# Patient Record
Sex: Male | Born: 1980 | Race: Black or African American | Hispanic: No | Marital: Single | Smoking: Never smoker
Health system: Southern US, Community
[De-identification: ages and names within clinical notes are randomized; demographics above are authoritative.]

## PROBLEM LIST (undated history)

## (undated) DIAGNOSIS — E039 Hypothyroidism, unspecified: Secondary | ICD-10-CM

## (undated) DIAGNOSIS — D649 Anemia, unspecified: Secondary | ICD-10-CM

## (undated) DIAGNOSIS — R7989 Other specified abnormal findings of blood chemistry: Secondary | ICD-10-CM

## (undated) DIAGNOSIS — K047 Periapical abscess without sinus: Secondary | ICD-10-CM

## (undated) DIAGNOSIS — R040 Epistaxis: Secondary | ICD-10-CM

## (undated) HISTORY — PX: OTHER SURGICAL HISTORY: SHX169

---

## 2006-11-21 ENCOUNTER — Inpatient Hospital Stay (HOSPITAL_COMMUNITY): Admission: EM | Admit: 2006-11-21 | Discharge: 2006-11-22 | Payer: Self-pay | Admitting: Emergency Medicine

## 2008-11-04 ENCOUNTER — Emergency Department (HOSPITAL_BASED_OUTPATIENT_CLINIC_OR_DEPARTMENT_OTHER): Admission: EM | Admit: 2008-11-04 | Discharge: 2008-11-04 | Payer: Self-pay | Admitting: Emergency Medicine

## 2008-11-04 ENCOUNTER — Ambulatory Visit: Payer: Self-pay | Admitting: Diagnostic Radiology

## 2008-12-23 ENCOUNTER — Ambulatory Visit: Payer: Self-pay | Admitting: Diagnostic Radiology

## 2008-12-23 ENCOUNTER — Emergency Department (HOSPITAL_BASED_OUTPATIENT_CLINIC_OR_DEPARTMENT_OTHER): Admission: EM | Admit: 2008-12-23 | Discharge: 2008-12-23 | Payer: Self-pay | Admitting: Emergency Medicine

## 2010-05-01 ENCOUNTER — Ambulatory Visit: Payer: Self-pay | Admitting: Cardiovascular Disease

## 2010-05-01 ENCOUNTER — Observation Stay (HOSPITAL_COMMUNITY): Admission: EM | Admit: 2010-05-01 | Discharge: 2010-05-01 | Payer: Self-pay | Admitting: Emergency Medicine

## 2010-05-01 ENCOUNTER — Encounter (INDEPENDENT_AMBULATORY_CARE_PROVIDER_SITE_OTHER): Payer: Self-pay | Admitting: Emergency Medicine

## 2010-10-03 LAB — POCT I-STAT, CHEM 8
BUN: 10 mg/dL (ref 6–23)
Calcium, Ion: 1.08 mmol/L — ABNORMAL LOW (ref 1.12–1.32)
Chloride: 103 mEq/L (ref 96–112)
Creatinine, Ser: 1.3 mg/dL (ref 0.4–1.5)
Glucose, Bld: 122 mg/dL — ABNORMAL HIGH (ref 70–99)
HCT: 48 % (ref 39.0–52.0)
Hemoglobin: 16.3 g/dL (ref 13.0–17.0)
Potassium: 2.9 mEq/L — ABNORMAL LOW (ref 3.5–5.1)
Sodium: 139 mEq/L (ref 135–145)
TCO2: 24 mmol/L (ref 0–100)

## 2010-10-03 LAB — HEPATIC FUNCTION PANEL
ALT: 57 U/L — ABNORMAL HIGH (ref 0–53)
AST: 59 U/L — ABNORMAL HIGH (ref 0–37)
Albumin: 4.2 g/dL (ref 3.5–5.2)
Alkaline Phosphatase: 69 U/L (ref 39–117)
Bilirubin, Direct: 0.2 mg/dL (ref 0.0–0.3)
Indirect Bilirubin: 0.9 mg/dL (ref 0.3–0.9)
Total Bilirubin: 1.1 mg/dL (ref 0.3–1.2)
Total Protein: 7.1 g/dL (ref 6.0–8.3)

## 2010-10-03 LAB — CBC
HCT: 44.9 % (ref 39.0–52.0)
Hemoglobin: 15.6 g/dL (ref 13.0–17.0)
MCH: 33 pg (ref 26.0–34.0)
MCHC: 34.7 g/dL (ref 30.0–36.0)
MCV: 94.9 fL (ref 78.0–100.0)
Platelets: 161 10*3/uL (ref 150–400)
RBC: 4.73 MIL/uL (ref 4.22–5.81)
RDW: 12.2 % (ref 11.5–15.5)
WBC: 8 10*3/uL (ref 4.0–10.5)

## 2010-10-03 LAB — RAPID URINE DRUG SCREEN, HOSP PERFORMED
Amphetamines: NOT DETECTED
Barbiturates: NOT DETECTED
Benzodiazepines: NOT DETECTED
Cocaine: NOT DETECTED
Opiates: NOT DETECTED
Tetrahydrocannabinol: NOT DETECTED

## 2010-10-03 LAB — LIPASE, BLOOD: Lipase: 25 U/L (ref 11–59)

## 2010-10-03 LAB — POCT CARDIAC MARKERS
CKMB, poc: <1 ng/mL — ABNORMAL LOW (ref 1.0–8.0)
CKMB, poc: <1 ng/mL — ABNORMAL LOW (ref 1.0–8.0)
Myoglobin, poc: 38.2 ng/mL (ref 12–200)
Myoglobin, poc: 41.7 ng/mL (ref 12–200)
Troponin i, poc: <0.05 ng/mL (ref 0.00–0.09)
Troponin i, poc: <0.05 ng/mL (ref 0.00–0.09)

## 2010-10-03 LAB — D-DIMER, QUANTITATIVE: D-Dimer, Quant: 0.33 ug/mL-FEU (ref 0.00–0.48)

## 2010-10-03 LAB — ETHANOL: Alcohol, Ethyl (B): <5 mg/dL (ref 0–10)

## 2010-10-28 LAB — BASIC METABOLIC PANEL
BUN: 13 mg/dL (ref 6–23)
CO2: 26 mEq/L (ref 19–32)
Calcium: 9.1 mg/dL (ref 8.4–10.5)
Chloride: 103 mEq/L (ref 96–112)
Creatinine, Ser: 1 mg/dL (ref 0.4–1.5)
GFR calc Af Amer: >60 mL/min (ref >60)
GFR calc non Af Amer: >60 mL/min (ref >60)
Glucose, Bld: 88 mg/dL (ref 70–99)
Potassium: 3.6 mEq/L (ref 3.5–5.1)
Sodium: 141 mEq/L (ref 135–145)

## 2010-10-28 LAB — DIFFERENTIAL
Basophils Absolute: 0 10*3/uL (ref 0.0–0.1)
Basophils Relative: 1 % (ref 0–1)
Eosinophils Absolute: 0.1 10*3/uL (ref 0.0–0.7)
Eosinophils Relative: 3 % (ref 0–5)
Lymphocytes Relative: 40 % (ref 12–46)
Lymphs Abs: 1.8 10*3/uL (ref 0.7–4.0)
Monocytes Absolute: 0.5 10*3/uL (ref 0.1–1.0)
Monocytes Relative: 11 % (ref 3–12)
Neutro Abs: 2.1 10*3/uL (ref 1.7–7.7)
Neutrophils Relative %: 46 % (ref 43–77)

## 2010-10-28 LAB — CBC
HCT: 42.9 % (ref 39.0–52.0)
Hemoglobin: 14.8 g/dL (ref 13.0–17.0)
MCHC: 34.4 g/dL (ref 30.0–36.0)
MCV: 96.2 fL (ref 78.0–100.0)
Platelets: 230 10*3/uL (ref 150–400)
RBC: 4.46 MIL/uL (ref 4.22–5.81)
RDW: 11.8 % (ref 11.5–15.5)
WBC: 4.5 10*3/uL (ref 4.0–10.5)

## 2010-10-30 LAB — POCT CARDIAC MARKERS
CKMB, poc: 1.2 ng/mL (ref 1.0–8.0)
Myoglobin, poc: 48.4 ng/mL (ref 12–200)
Troponin i, poc: <0.05 ng/mL (ref 0.00–0.09)

## 2010-12-06 NOTE — Op Note (Signed)
[name], [name]               ACCOUNT NO.:  [id number]   MEDICAL RECORD NO.:  [id number]          PATIENT TYPE:  INP   LOCATION:  1534                         FACILITY:  [facility]   PHYSICIAN:  Dionne Ano. Gramig III, M.D.DATE OF BIRTH:  Jul 30, 1980   DATE OF PROCEDURE:  11/21/2006  DATE OF DISCHARGE:                               OPERATIVE REPORT   PREOPERATIVE DIAGNOSIS:  Right forearm deep laceration/puncture injury  with inability to extend the fingers and wrist (the posterior  interosseous nerve injury with musculotendinous injury noted).   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   OPERATION/PROCEDURE:  1. Irrigation and debridement of skin, subcutaneous tissue, muscle      tendon, and periosteal bone tissue, right forearm.  2. Packed fasciotomy, right forearm.  3. Radial nerve decompression, extensive with neurolysis.  4. Posterior interosseous nerve repair at the forearm level (major      peripheral nerve repair at the forearm level).  5. Extensor carpi ulnaris at tendon repair.   SURGEON:  Dionne Ano. Amanda Pea, M.D.   COMPLICATIONS:  None.   ANESTHESIA:  General.   TOURNIQUET TIME:  Less than 90 minutes.   INDICATIONS FOR PROCEDURE:  This patient is a 30 year old male who  presented to the emergency room with puncture wound.  He states he  recalls following down some steps, but cannot give all of the details of  the event.  In any event, he presents after a questionable fall with a  large puncture wound in the forearm.  He has been given preoperative  antibiotics.  His tetanus status has been addressed.  He has been  counseled in regards to his method of surgery graft of surgery including  risk of infection, bleeding, anesthesia, damage to normal structures and  failure of surgery to accomplish its intended goals of relieving  symptoms and restoring function.  I have  discussed with him the grave  nature of a nerve laceration at this level.   OPERATION:  The patient was taken to the operating suite and underwent  smooth induction of general anesthesia.  The patient was placed supine  position, properly padded, and underwent sterile prep about the right  upper extremity.  Bony architecture was intact.  X-rays were negative  for fracture buy he did have periosteal involvement.  I commenced with  an incision proximal and distal to the laceration which was an inch to  an inch and a half long. I could stick my fully gloved finger all the  way to the my  MCP joint in terms of the depths of the penetration of  the instrument which penetrated his skin.  This instrument course was  unknown at the present time according to the patient's verbal report.   Following this, I enlarged the wound and performed a fasciotomy of the  forearm.  Fasciotomy was performed about the extensor apparatus without  difficulty.   Following this, I then performed irrigation and debridement of skin,  subcutaneous tissue and bone to include periosteal tissue, muscle and  tendon tissue.  This was an excisional debridement with evacuation of  hematoma and removal of nonviable tissue.  Once this was done, I lavaged  him with greater than  3 L of saline.  Once this done, I then evaluated  the patient's soft tissue parameters.  She had a complete laceration of  the posterior interosseous nerve accounting for his loss of wrist  extension and finger extension.  I had to mobilize the area and thus  performed a complete radial tunnel release, releasing the supinator in  its entirety.  I released the supinator, freed up the radial nerve and  its posterior interosseous nerve contributions.  This was a radial  tunnel release.  The arcade of [place]ew and the supinator were released as  was the ECRB.  Following radial tunnel release, I then dressed the  posterior interosseous nerve.   Post  interosseous nerve was repaired with combination 8-0 and 7-0 nylon  with microscopic technique and magnification.  The nerve was repaired  circumferentially without difficulty and following circumferential  repair, I then tested it and it was noted be stable.  I would give this  a variable prognosis given the severity of the injury.   Following this, I irrigated additionally.  I checked the area once again  and then performed extensor carpi ulnaris tendon repair as at this  level, the patient had good tissue for repair.  The remaining tissue was  primary musculature and did not involve large amounts of tendon tissue  and thus I did not perform any EDC, EPL or EIP tendon repairs.  This was  cut through the muscle belly.  Once this was done, I then performed  irrigation of the area.  Irrigation was applied without difficulty and  the wound was then closed after hemostasis was secured with Vicryl  followed by Prolene.  Long-arm plaster splint was applied with the wrist  in extension and the elbow at 90.   He will be continued as an inpatient.  We will plan for IV antibiotics,  pain management and other postoperative measures.   I would consider early radial nerve transfers with the FCR and palmaris  longus if he has one for donor.  This is due to the high lesion in terms  of the forearm and the variable prognosis for recovery in my opinion.  All questions have been encouraged and answered.           ______________________________  Dionne Ano. Everlene Other, M.D.     Nash Mantis  D:  11/21/2006  T:  11/22/2006  Job:  [id number]

## 2011-03-07 ENCOUNTER — Emergency Department (HOSPITAL_COMMUNITY)
Admission: EM | Admit: 2011-03-07 | Discharge: 2011-03-07 | Disposition: A | Discharge status: Home / Self Care | Payer: Self-pay | Attending: Emergency Medicine | Admitting: Emergency Medicine

## 2011-03-07 ENCOUNTER — Emergency Department (HOSPITAL_COMMUNITY): Payer: Self-pay

## 2011-03-07 DIAGNOSIS — R079 Chest pain, unspecified: Secondary | ICD-10-CM | POA: Insufficient documentation

## 2011-03-07 DIAGNOSIS — K029 Dental caries, unspecified: Secondary | ICD-10-CM | POA: Insufficient documentation

## 2011-03-07 DIAGNOSIS — K089 Disorder of teeth and supporting structures, unspecified: Secondary | ICD-10-CM | POA: Insufficient documentation

## 2011-03-07 DIAGNOSIS — R209 Unspecified disturbances of skin sensation: Secondary | ICD-10-CM | POA: Insufficient documentation

## 2011-04-29 ENCOUNTER — Emergency Department (HOSPITAL_COMMUNITY): Payer: Self-pay

## 2011-04-29 ENCOUNTER — Emergency Department (HOSPITAL_COMMUNITY)
Admission: EM | Admit: 2011-04-29 | Discharge: 2011-04-29 | Disposition: A | Discharge status: Home / Self Care | Payer: Self-pay | Attending: Emergency Medicine | Admitting: Emergency Medicine

## 2011-04-29 DIAGNOSIS — R079 Chest pain, unspecified: Secondary | ICD-10-CM | POA: Insufficient documentation

## 2011-04-29 DIAGNOSIS — R0602 Shortness of breath: Secondary | ICD-10-CM | POA: Insufficient documentation

## 2011-04-29 DIAGNOSIS — R209 Unspecified disturbances of skin sensation: Secondary | ICD-10-CM | POA: Insufficient documentation

## 2011-04-29 LAB — BASIC METABOLIC PANEL
BUN: 11 mg/dL (ref 6–23)
CO2: 26 mEq/L (ref 19–32)
Calcium: 10 mg/dL (ref 8.4–10.5)
GFR calc non Af Amer: >90 mL/min (ref >90)
Glucose, Bld: 103 mg/dL — ABNORMAL HIGH (ref 70–99)
Potassium: 3.7 mEq/L (ref 3.5–5.1)
Sodium: 137 mEq/L (ref 135–145)

## 2011-04-29 LAB — DIFFERENTIAL
Basophils Absolute: 0 10*3/uL (ref 0.0–0.1)
Basophils Relative: 0 % (ref 0–1)
Eosinophils Absolute: 0.1 10*3/uL (ref 0.0–0.7)
Lymphocytes Relative: 26 % (ref 12–46)
Monocytes Absolute: 0.5 10*3/uL (ref 0.1–1.0)
Monocytes Relative: 8 % (ref 3–12)
Neutro Abs: 3.7 10*3/uL (ref 1.7–7.7)
Neutrophils Relative %: 65 % (ref 43–77)

## 2011-04-29 LAB — CBC
HCT: 45.4 % (ref 39.0–52.0)
Hemoglobin: 15.7 g/dL (ref 13.0–17.0)
MCH: 33.1 pg (ref 26.0–34.0)
MCHC: 34.6 g/dL (ref 30.0–36.0)
MCV: 95.8 fL (ref 78.0–100.0)
Platelets: 232 10*3/uL (ref 150–400)
RBC: 4.74 MIL/uL (ref 4.22–5.81)

## 2011-06-15 ENCOUNTER — Emergency Department (HOSPITAL_COMMUNITY)
Admission: EM | Admit: 2011-06-15 | Discharge: 2011-06-15 | Disposition: A | Discharge status: Home / Self Care | Payer: Self-pay | Attending: Emergency Medicine | Admitting: Emergency Medicine

## 2011-06-15 ENCOUNTER — Encounter: Payer: Self-pay | Admitting: *Deleted

## 2011-06-15 ENCOUNTER — Other Ambulatory Visit: Payer: Self-pay

## 2011-06-15 DIAGNOSIS — R209 Unspecified disturbances of skin sensation: Secondary | ICD-10-CM | POA: Insufficient documentation

## 2011-06-15 DIAGNOSIS — R202 Paresthesia of skin: Secondary | ICD-10-CM

## 2011-06-15 HISTORY — DX: Periapical abscess without sinus: K04.7

## 2011-06-15 MED ORDER — ASPIRIN 81 MG PO CHEW: 81.0000 mg | CHEWABLE_TABLET | Freq: Every day | ORAL | Status: AC

## 2011-06-15 NOTE — ED Notes (Signed)
Pt states he is feeling numbness in his  feet and chest, seen here for same twice over last few months, was told and treated for teeth infection which he has not taken care of.

## 2011-06-15 NOTE — ED Provider Notes (Addendum)
History     CSN: [id number] Arrival date & time: 06/15/2011  3:22 PM   First MD Initiated Contact with Patient 06/15/11 1820      Chief Complaint  Patient presents with  . Numbness     The history is provided by the patient.   the patient reports intermittent tingling of his left chest left arm and left leg.  This been occurring for approximately 2 months.  He reports his symptoms last approximately 4-12 hours.  There is no associated facial symptoms.  There is no associated weakness.  No changes in speech.  No difficulty swallowing.  No prior history of stroke.  No history of heart disease.  No early family history of heart disease.  He reports having a stress test one year ago for some twinges of pain that was negative.  He does not have a primary care doctor he does not have insurance.  He is seen in the emergency department with a several times without a diagnosis.  Nothing worsens the symptoms.  Nothing improves the symptoms.  The symptoms are mild.  At this time he is without numbness or tingling or weakness.  Past Medical History  Diagnosis Date  . Tooth infection     History reviewed. No pertinent past surgical history.  No family history on file.  History  Substance Use Topics  . Smoking status: Never Smoker   . Smokeless tobacco: Not on file  . Alcohol Use: Yes      Review of Systems  All other systems reviewed and are negative.    Allergies  Review of patient's allergies indicates no known allergies.  Home Medications  No current outpatient prescriptions on file.  BP 129/82  Pulse 89  Temp(Src) 98.9 F (37.2 C) (Oral)  Resp 18  SpO2 100%  Physical Exam  Nursing note and vitals reviewed. Constitutional: He is oriented to person, place, and time. He appears well-developed and well-nourished.  HENT:  Head: Normocephalic and atraumatic.  Eyes: Pupils are equal, round, and reactive to light.  Cardiovascular: Regular rhythm.   Pulmonary/Chest: Effort  normal.  Abdominal: Soft.  Neurological: He is alert and oriented to person, place, and time.       5/5 strength in major muscle groups of  bilateral upper and lower extremities. Speech normal. No facial asymetry.  Normal sensation throughout his face upper extremity and lower extremity.    ED Course  Procedures (including critical care time)   Date: 06/15/2011  Rate: 70  Rhythm: normal sinus rhythm  QRS Axis: normal  Intervals: normal  ST/T Wave abnormalities: normal  Conduction Disutrbances:none  Narrative Interpretation:   Old EKG Reviewed: No significant changes noted   Labs Reviewed - No data to display No results found.   1. Paresthesia       MDM  Paresthesias.  Doubt stroke.  Doubt TIA.  No cord syndrome.  EKG is normal sinus rhythm.  Doubt ACS.  Patient referred to neurology for outpatient workup for paresthesias.        Lyanne Co, MD 06/15/11 Windell Moment  Lyanne Co, MD 06/15/11 [phone]

## 2011-06-15 NOTE — ED Notes (Signed)
For 6-7 months the pt states that he has had numbness in his chest, left arm, and feet off and on. He states that it comes on sometimes after he eats. He has also had it investigated in the past to see if it had anything to do with a tooth that needs to be pulled. No pain. Just tingling and numbness. NAD.

## 2011-09-02 ENCOUNTER — Encounter (HOSPITAL_BASED_OUTPATIENT_CLINIC_OR_DEPARTMENT_OTHER): Payer: Self-pay | Admitting: Emergency Medicine

## 2011-09-02 ENCOUNTER — Emergency Department (HOSPITAL_BASED_OUTPATIENT_CLINIC_OR_DEPARTMENT_OTHER)
Admission: EM | Admit: 2011-09-02 | Discharge: 2011-09-02 | Disposition: A | Discharge status: Home / Self Care | Payer: Self-pay | Attending: Emergency Medicine | Admitting: Emergency Medicine

## 2011-09-02 DIAGNOSIS — R202 Paresthesia of skin: Secondary | ICD-10-CM

## 2011-09-02 DIAGNOSIS — R209 Unspecified disturbances of skin sensation: Secondary | ICD-10-CM | POA: Insufficient documentation

## 2011-09-02 NOTE — ED Notes (Addendum)
Pt reports awaking with numbness in left hand and left arm. Symptoms have since resolved. Pt was seen for same in november

## 2011-09-02 NOTE — ED Provider Notes (Signed)
History     CSN: [id number]  Arrival date & time 09/02/11  0456   First MD Initiated Contact with Patient 09/02/11 [phone]      Chief Complaint  Patient presents with  . Numbness    (Consider location/radiation/quality/duration/timing/severity/associated sxs/prior treatment) HPI Is a 31 year old black male with no significant past medical history. He will component hour ago with paresthesias in his left hand. It was not completely numb felt like pins and needles. He describes the symptoms as moderate to severe. They resolved over about the next 25 minutes. He has only the slightest remaining paresthesias in his fingertips. He states the paresthesias were in his entire hand and part of the left forearm and has not describe a particular dermatomal or nerve pattern. He states he was sleeping on the floor. There was no associated pain. There was no associated motor deficit. There was no associated headache. There was no associated neck pain. There was associated chest pain, dyspnea, nausea, vomiting or diarrhea. Nothing made the symptoms better or worse and they resolve on their own. He was evaluated here November of last year for similar complaint. He stated at that time his symptoms have been coming and going for months. He was referred to neurology but did not follow up.   Past Medical History  Diagnosis Date  . Tooth infection     History reviewed. No pertinent past surgical history.  No family history on file.  History  Substance Use Topics  . Smoking status: Never Smoker   . Smokeless tobacco: Not on file  . Alcohol Use: Yes      Review of Systems  All other systems reviewed and are negative.    Allergies  Review of patient's allergies indicates no known allergies.  Home Medications   Current Outpatient Rx  Name Route Sig Dispense Refill  . ASPIRIN 81 MG PO CHEW Oral Chew 1 tablet (81 mg total) by mouth daily. 30 tablet 0    BP 151/81  Pulse 73  Temp(Src) 98.1 F  (36.7 C) (Oral)  Resp 18  Ht 6\' 3"  (1.905 m)  Wt 240 lb (108.863 kg)  BMI 30.00 kg/m2  SpO2 100%  Physical Exam General: Well-developed, well-nourished male in no acute distress; appearance consistent with age of record HENT: normocephalic, atraumatic Eyes: pupils equal round and reactive to light; extraocular muscles intact Neck: supple; non-tender Heart: regular rate and rhythm; no murmurs Lungs: clear to auscultation bilaterally Abdomen: soft; nondistended Extremities: No deformity; full range of motion; pulses normal Neurologic: Awake, alert and oriented; motor +5/5 in all extremities and symmetric; no facial droop; normal sensation; normal coordination; normal speech; negative Romberg; normal finger to nose; normal gait Skin: Warm and dry Psychiatric: Normal mood and affect    ED Course  Procedures (including critical care time)     MDM  The patient has no risk factors for stroke. As noted above he has had similar episodes in the past. The symptoms do not appear to be due to a radiculopathy. The transient nature of the symptoms makes a neurodegenerative disease unlikely. The onset during sleep, and resolution after awakening, suggest positional nerve compression.          Hanley Seamen, MD 09/02/11 [phone]

## 2012-06-19 ENCOUNTER — Encounter (HOSPITAL_BASED_OUTPATIENT_CLINIC_OR_DEPARTMENT_OTHER): Payer: Self-pay | Admitting: *Deleted

## 2012-06-19 ENCOUNTER — Emergency Department (HOSPITAL_BASED_OUTPATIENT_CLINIC_OR_DEPARTMENT_OTHER)
Admission: EM | Admit: 2012-06-19 | Discharge: 2012-06-19 | Disposition: A | Discharge status: Home / Self Care | Payer: Self-pay | Attending: Emergency Medicine | Admitting: Emergency Medicine

## 2012-06-19 DIAGNOSIS — R0602 Shortness of breath: Secondary | ICD-10-CM | POA: Insufficient documentation

## 2012-06-19 DIAGNOSIS — R079 Chest pain, unspecified: Secondary | ICD-10-CM

## 2012-06-19 DIAGNOSIS — R209 Unspecified disturbances of skin sensation: Secondary | ICD-10-CM | POA: Insufficient documentation

## 2012-06-19 NOTE — ED Provider Notes (Signed)
History  This chart was scribed for [name]. Oletta Lamas, MD by Ardeen Jourdain, ED Scribe. This patient was seen in room MH02/MH02 and the patient's care was started at 2044.  CSN: [id number]  Arrival date & time 06/19/12  2002   First MD Initiated Contact with Patient 06/19/12 2044      Chief Complaint  Patient presents with  . Chest Pain     The history is provided by the patient. No language interpreter was used.    [name] is a 31 y.o. male who presents to the Emergency Department complaining of gradually worsening CP. He states he has had the pain intermittently for the past 6-7 months, but tonight at 4:30 it started to worsen. He reports watching TV when the symptoms began. He has associated numbness in his left arm and leg as well as intermittent SOB. He has a h/o these symptoms in the past, which he states has been giving him anxiety. He denies any injury that could have caused the pain. He denies a family history of heart disease.  He has a h/o tooth infection as well. He is an occasional alcohol user but denies smoking.  At work he does a lot of heavy lifting and moving things.  With exertion at work or with exercise, no CP, SOB.  Denies any associated sweats, nausea, dizziness.  No family h/o CAD, fainting, cardiomyopathy.  Doesn't smoke.  No recent long distance travel . No lower leg pain or swelling.      Past Medical History  Diagnosis Date  . Tooth infection     History reviewed. No pertinent past surgical history.  Family History  Problem Relation Age of Onset  . Heart failure Neg Hx   . Heart attack Neg Hx   . Fainting Neg Hx     History  Substance Use Topics  . Smoking status: Never Smoker   . Smokeless tobacco: Not on file  . Alcohol Use: Yes      Review of Systems  Constitutional: Negative for fever and chills.  HENT: Negative for congestion, sore throat and rhinorrhea.   Respiratory: Positive for shortness of breath.   Cardiovascular: Positive  for chest pain. Negative for palpitations and leg swelling.  Gastrointestinal: Negative for nausea and vomiting.  Musculoskeletal: Negative for back pain.  Neurological: Positive for numbness. Negative for dizziness, syncope and weakness.    Allergies  Review of patient's allergies indicates no known allergies.  Home Medications  No current outpatient prescriptions on file.  Triage Vitals: BP 142/88  Pulse 90  Temp 98.5 F (36.9 C) (Oral)  Resp 20  Ht 6\' 3"  (1.905 m)  Wt 240 lb (108.863 kg)  BMI 30.00 kg/m2  SpO2 100%  Physical Exam  Nursing note and vitals reviewed. Constitutional: He is oriented to person, place, and time. He appears well-developed and well-nourished. No distress.  HENT:  Head: Normocephalic and atraumatic.  Eyes: EOM are normal. Pupils are equal, round, and reactive to light.  Neck: Normal range of motion. Neck supple. No tracheal deviation present.  Cardiovascular: Normal rate, regular rhythm and normal heart sounds.  Exam reveals no gallop and no friction rub.   No murmur heard. Pulmonary/Chest: Effort normal and breath sounds normal. No respiratory distress.  Abdominal: Soft. He exhibits no distension. There is no tenderness.  Musculoskeletal: Normal range of motion. He exhibits no edema.  Neurological: He is alert and oriented to person, place, and time. He has normal strength. No sensory deficit. He exhibits  normal muscle tone. Coordination normal. GCS eye subscore is 4. GCS verbal subscore is 5. GCS motor subscore is 6.  Skin: Skin is warm and dry. He is not diaphoretic.  Psychiatric: He has a normal mood and affect. His behavior is normal.    ED Course  Procedures (including critical care time)  DIAGNOSTIC STUDIES: Oxygen Saturation is 100% on room air, normal by my interpretation.    COORDINATION OF CARE:  9:26 PM: Discussed treatment plan which includes an EKG with pt at bedside and pt agreed to plan.    Labs Reviewed - No data to  display No results found.   1. Chest pain     ECG at time 20:14 shows NSR at rate 80, normal axis, non specific T wave abn, normal intervals.  No sig change from ECG on 06/15/2011.    MDM  I personally performed the services described in this documentation, which was scribed in my presence. The recorded information has been reviewed and is accurate.  No exertional CP.  No stroke symptoms.  Normal vitals, sats normal, no fever, exam is normal . Pt is reassured.  No cad risks.  No pe risks.       [name]. Oletta Lamas, MD 06/19/12 2306

## 2012-06-19 NOTE — ED Notes (Signed)
Pt states this has happened off and on x 6-7 months. Tonight was sitting and had had a beer. Began having a "funny feeling" in his chest. Numbness left arm and leg. Short of breath at night sometimes. Denies other s/s. EKG being done at triage.

## 2012-06-19 NOTE — Discharge Instructions (Signed)
 Chest Pain (Nonspecific) It is often hard to give a specific diagnosis for the cause of chest pain. There is always a chance that your pain could be related to something serious, such as a heart attack or a blood clot in the lungs. You need to follow up with your caregiver for further evaluation. CAUSES   Heartburn.  Pneumonia or bronchitis.  Anxiety or stress.  Inflammation around your heart (pericarditis) or lung (pleuritis or pleurisy).  A blood clot in the lung.  A collapsed lung (pneumothorax). It can develop suddenly on its own (spontaneous pneumothorax) or from injury (trauma) to the chest.  Shingles infection (herpes zoster virus). The chest wall is composed of bones, muscles, and cartilage. Any of these can be the source of the pain.  The bones can be bruised by injury.  The muscles or cartilage can be strained by coughing or overwork.  The cartilage can be affected by inflammation and become sore (costochondritis). DIAGNOSIS  Lab tests or other studies, such as X-rays, electrocardiography, stress testing, or cardiac imaging, may be needed to find the cause of your pain.  TREATMENT   Treatment depends on what may be causing your chest pain. Treatment may include:  Acid blockers for heartburn.  Anti-inflammatory medicine.  Pain medicine for inflammatory conditions.  Antibiotics if an infection is present.  You may be advised to change lifestyle habits. This includes stopping smoking and avoiding alcohol, caffeine, and chocolate.  You may be advised to keep your head raised (elevated) when sleeping. This reduces the chance of acid going backward from your stomach into your esophagus.  Most of the time, nonspecific chest pain will improve within 2 to 3 days with rest and mild pain medicine. HOME CARE INSTRUCTIONS   If antibiotics were prescribed, take your antibiotics as directed. Finish them even if you start to feel better.  For the next few days, avoid physical  activities that bring on chest pain. Continue physical activities as directed.  Do not smoke.  Avoid drinking alcohol.  Only take over-the-counter or prescription medicine for pain, discomfort, or fever as directed by your caregiver.  Follow your caregiver's suggestions for further testing if your chest pain does not go away.  Keep any follow-up appointments you made. If you do not go to an appointment, you could develop lasting (chronic) problems with pain. If there is any problem keeping an appointment, you must call to reschedule. SEEK MEDICAL CARE IF:   You think you are having problems from the medicine you are taking. Read your medicine instructions carefully.  Your chest pain does not go away, even after treatment.  You develop a rash with blisters on your chest. SEEK IMMEDIATE MEDICAL CARE IF:   You have increased chest pain or pain that spreads to your arm, neck, jaw, back, or abdomen.  You develop shortness of breath, an increasing cough, or you are coughing up blood.  You have severe back or abdominal pain, feel nauseous, or vomit.  You develop severe weakness, fainting, or chills.  You have a fever. THIS IS AN EMERGENCY. Do not wait to see if the pain will go away. Get medical help at once. Call your local emergency services (911 in [place] Do not drive yourself to the hospital. MAKE SURE YOU:   Understand these instructions.  Will watch your condition.  Will get help right away if you are not doing well or get worse. Document Released: 04/16/2005 Document Revised: 09/29/2011 Document Reviewed: 02/10/2008 [facility] Patient Information 2013 [employer]   Consider taking daily antacids like prilosec or zantac which can be bought over the counter for reflux for 2 weeks and take tylenol  for mild pain relief as needed in the meantime.

## 2013-01-26 ENCOUNTER — Encounter (HOSPITAL_BASED_OUTPATIENT_CLINIC_OR_DEPARTMENT_OTHER): Payer: Self-pay

## 2013-01-26 ENCOUNTER — Emergency Department (HOSPITAL_BASED_OUTPATIENT_CLINIC_OR_DEPARTMENT_OTHER)
Admission: EM | Admit: 2013-01-26 | Discharge: 2013-01-26 | Disposition: A | Discharge status: Home / Self Care | Payer: Self-pay | Attending: Emergency Medicine | Admitting: Emergency Medicine

## 2013-01-26 DIAGNOSIS — Y9289 Other specified places as the place of occurrence of the external cause: Secondary | ICD-10-CM | POA: Insufficient documentation

## 2013-01-26 DIAGNOSIS — S01309A Unspecified open wound of unspecified ear, initial encounter: Secondary | ICD-10-CM | POA: Insufficient documentation

## 2013-01-26 DIAGNOSIS — Z8719 Personal history of other diseases of the digestive system: Secondary | ICD-10-CM | POA: Insufficient documentation

## 2013-01-26 DIAGNOSIS — IMO0002 Reserved for concepts with insufficient information to code with codable children: Secondary | ICD-10-CM | POA: Insufficient documentation

## 2013-01-26 DIAGNOSIS — Y9389 Activity, other specified: Secondary | ICD-10-CM | POA: Insufficient documentation

## 2013-01-26 DIAGNOSIS — S01312A Laceration without foreign body of left ear, initial encounter: Secondary | ICD-10-CM
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
## 2013-01-27 NOTE — ED Provider Notes (Signed)
Medical screening examination/treatment/procedure(s) were performed by non-physician practitioner and as supervising physician I was immediately available for consultation/collaboration.   Charles B. Sheldon, MD 01/27/13 1525 

## 2013-02-26 ENCOUNTER — Emergency Department (HOSPITAL_BASED_OUTPATIENT_CLINIC_OR_DEPARTMENT_OTHER)
Admission: EM | Admit: 2013-02-26 | Discharge: 2013-02-26 | Disposition: A | Discharge status: Home / Self Care | Payer: Self-pay | Attending: Emergency Medicine | Admitting: Emergency Medicine

## 2013-02-26 ENCOUNTER — Encounter (HOSPITAL_BASED_OUTPATIENT_CLINIC_OR_DEPARTMENT_OTHER): Payer: Self-pay | Admitting: *Deleted

## 2013-02-26 DIAGNOSIS — K029 Dental caries, unspecified: Secondary | ICD-10-CM | POA: Insufficient documentation

## 2013-02-26 MED ORDER — PENICILLIN V POTASSIUM 250 MG PO TABS
500.0000 mg | ORAL_TABLET | Freq: Once | ORAL | Status: AC
  Administered 2013-02-26: 500 mg via ORAL
  Filled 2013-02-26: qty 2

## 2013-02-26 MED ORDER — PENICILLIN V POTASSIUM 500 MG PO TABS: 500.0000 mg | ORAL_TABLET | Freq: Four times a day (QID) | ORAL | Status: AC

## 2013-02-26 MED ORDER — OXYCODONE-ACETAMINOPHEN 5-325 MG PO TABS: 1.0000 | ORAL_TABLET | Freq: Four times a day (QID) | ORAL | Status: DC | PRN

## 2013-02-26 NOTE — ED Provider Notes (Signed)
  CSN: [id number]     Arrival date & time 02/26/13  0020 History     None    Chief Complaint  Patient presents with  . Dental Pain   (Consider location/radiation/quality/duration/timing/severity/associated sxs/prior Treatment) Patient is a 32 y.o. male presenting with tooth pain. The history is provided by the patient. No language interpreter was used.  Dental Pain Location:  Lower Lower teeth location:  31/RL 2nd molar Quality:  Dull Severity:  Moderate Onset quality:  Sudden Duration:  2 days Timing:  Intermittent Progression:  Worsening Chronicity:  New Context: dental fracture   Previous work-up:  Dental exam Relieved by:  Nothing Worsened by:  Nothing tried Ineffective treatments:  None tried Associated symptoms: no congestion, no drooling, no neck swelling, no oral lesions and no trismus   Risk factors: no smoking     Past Medical History  Diagnosis Date  . Tooth infection    Past Surgical History  Procedure Laterality Date  . Arm surgery     Family History  Problem Relation Age of Onset  . Heart failure Neg Hx   . Heart attack Neg Hx   . Fainting Neg Hx    History  Substance Use Topics  . Smoking status: Never Smoker   . Smokeless tobacco: Not on file  . Alcohol Use: Yes     Comment: every other day    Review of Systems  HENT: Negative for congestion, drooling, mouth sores and neck stiffness.   All other systems reviewed and are negative.    Allergies  Review of patient's allergies indicates no known allergies.  Home Medications  No current outpatient prescriptions on file. BP 154/96  Pulse 94  Temp(Src) 99.6 F (37.6 C) (Oral)  Resp 18  Ht 6\' 3"  (1.905 m)  Wt 245 lb (111.131 kg)  BMI 30.62 kg/m2  SpO2 99% Physical Exam  Constitutional: He is oriented to person, place, and time. He appears well-developed and well-nourished. No distress.  HENT:  Head: Normocephalic and atraumatic.  Mouth/Throat:    Eyes: Conjunctivae are normal.  Pupils are equal, round, and reactive to light.  Neck: Normal range of motion. Neck supple.  Cardiovascular: Normal rate, regular rhythm and intact distal pulses.   Pulmonary/Chest: Effort normal and breath sounds normal. He has no wheezes. He has no rales.  Abdominal: Soft. Bowel sounds are normal. There is no tenderness. There is no rebound and no guarding.  Musculoskeletal: Normal range of motion.  Neurological: He is alert and oriented to person, place, and time.  Skin: Skin is warm and dry.  Psychiatric: He has a normal mood and affect.    ED Course   Procedures (including critical care time)  Labs Reviewed - No data to display No results found. No diagnosis found.  MDM  Will start antibiotics and pain medication and refer to dentist  Yamin Swingler K Aireona Torelli-Rasch, MD 02/26/13 0126

## 2013-02-26 NOTE — ED Notes (Signed)
Pt c/o pain in lower right molar since yesterday but became worse tonight.

## 2013-09-22 ENCOUNTER — Emergency Department (HOSPITAL_BASED_OUTPATIENT_CLINIC_OR_DEPARTMENT_OTHER)
Admission: EM | Admit: 2013-09-22 | Discharge: 2013-09-22 | Disposition: A | Discharge status: Home / Self Care | Payer: Self-pay | Attending: Emergency Medicine | Admitting: Emergency Medicine

## 2013-09-22 ENCOUNTER — Encounter (HOSPITAL_BASED_OUTPATIENT_CLINIC_OR_DEPARTMENT_OTHER): Payer: Self-pay | Admitting: Emergency Medicine

## 2013-09-22 DIAGNOSIS — Z791 Long term (current) use of non-steroidal anti-inflammatories (NSAID): Secondary | ICD-10-CM | POA: Insufficient documentation

## 2013-09-22 DIAGNOSIS — Z792 Long term (current) use of antibiotics: Secondary | ICD-10-CM | POA: Insufficient documentation

## 2013-09-22 DIAGNOSIS — K044 Acute apical periodontitis of pulpal origin: Secondary | ICD-10-CM | POA: Insufficient documentation

## 2013-09-22 DIAGNOSIS — K029 Dental caries, unspecified: Secondary | ICD-10-CM | POA: Insufficient documentation

## 2013-09-22 MED ORDER — PENICILLIN V POTASSIUM 250 MG PO TABS
500.0000 mg | ORAL_TABLET | Freq: Once | ORAL | Status: AC
  Administered 2013-09-22: 500 mg via ORAL
  Filled 2013-09-22: qty 2

## 2013-09-22 MED ORDER — PENICILLIN V POTASSIUM 500 MG PO TABS: 500.0000 mg | ORAL_TABLET | Freq: Four times a day (QID) | ORAL | Status: AC

## 2013-09-22 MED ORDER — DICLOFENAC SODIUM 75 MG PO TBEC: 75.0000 mg | DELAYED_RELEASE_TABLET | Freq: Two times a day (BID) | ORAL | Status: DC

## 2013-09-22 NOTE — Discharge Instructions (Signed)
Dental Caries °Dental caries is tooth decay. This decay can cause a hole in teeth (cavity) that can get bigger and deeper over time. °HOME CARE °· Brush and floss your teeth. Do this at least two times a day. °· Use a fluoride toothpaste. °· Use a mouth rinse if told by your dentist or doctor. °· Eat less sugary and starchy foods. Drink less sugary drinks. °· Avoid snacking often on sugary and starchy foods. Avoid sipping often on sugary drinks. °· Keep regular checkups and cleanings with your dentist. °· Use fluoride supplements if told by your dentist or doctor. °· Allow fluoride to be applied to teeth if told by your dentist or doctor. °MAKE SURE YOU: °· Understand these instructions. °· Will watch your condition. °· Will get help right away if you are not doing well or get worse. °Document Released: 04/15/2008 Document Revised: 03/09/2013 Document Reviewed: 07/09/2012 °Exit[employer]® Patient Information ©2014 [employer]. ° °

## 2013-09-22 NOTE — ED Provider Notes (Signed)
CSN: [id number]     Arrival date & time 09/22/13  0132 History   First MD Initiated Contact with Patient 09/22/13 0143     Chief Complaint  Patient presents with  . Dental Pain     (Consider location/radiation/quality/duration/timing/severity/associated sxs/prior Treatment) Patient is a 33 y.o. male presenting with tooth pain. The history is provided by the patient.  Dental Pain Location:  Upper Upper teeth location:  14/LU 1st molar and 15/LU 2nd molar Quality:  Dull Severity:  Moderate Onset quality:  Sudden Timing:  Constant Progression:  Unchanged Chronicity:  Recurrent Context: dental caries and poor dentition   Previous work-up:  Dental exam Relieved by:  Nothing Worsened by:  Nothing tried Ineffective treatments:  None tried Associated symptoms: no drooling   Associated symptoms comment:  Lower gum irritation Risk factors: alcohol problem     Past Medical History  Diagnosis Date  . Tooth infection    Past Surgical History  Procedure Laterality Date  . Arm surgery     Family History  Problem Relation Age of Onset  . Heart failure Neg Hx   . Heart attack Neg Hx   . Fainting Neg Hx    History  Substance Use Topics  . Smoking status: Never Smoker   . Smokeless tobacco: Not on file  . Alcohol Use: Yes     Comment: every other day    Review of Systems  HENT: Positive for dental problem. Negative for drooling.   All other systems reviewed and are negative.      Allergies  Review of patient's allergies indicates no known allergies.  Home Medications   Current Outpatient Rx  Name  Route  Sig  Dispense  Refill  . diclofenac (VOLTAREN) 75 MG EC tablet   Oral   Take 1 tablet (75 mg total) by mouth 2 (two) times daily.   14 tablet   0   . oxyCODONE-acetaminophen (PERCOCET) 5-325 MG per tablet   Oral   Take 1 tablet by mouth every 6 (six) hours as needed for pain.   13 tablet   0   . penicillin v potassium (VEETID) 500 MG tablet   Oral   Take 1  tablet (500 mg total) by mouth 4 (four) times daily.   40 tablet   0    BP 166/91  Pulse 74  Temp(Src) 99.8 F (37.7 C) (Oral)  Ht 6\' 3"  (1.905 m)  Wt 255 lb (115.667 kg)  BMI 31.87 kg/m2  SpO2 96% Physical Exam  Constitutional: He is oriented to person, place, and time. He appears well-developed and well-nourished. No distress.  HENT:  Head: Normocephalic and atraumatic.  Mouth/Throat: Oropharynx is clear and moist.    Eyes: EOM are normal.  Neck: Normal range of motion.  Cardiovascular: Normal rate, regular rhythm and intact distal pulses.   Pulmonary/Chest: Effort normal and breath sounds normal. He has no wheezes. He has no rales.  Abdominal: Soft. Bowel sounds are normal.  Musculoskeletal: Normal range of motion.  Lymphadenopathy:    He has no cervical adenopathy.  Neurological: He is alert and oriented to person, place, and time.  Skin: Skin is warm and dry.  Psychiatric: He has a normal mood and affect.    ED Course  Procedures (including critical care time) Labs Review Labs Reviewed - No data to display Imaging Review No results found.   EKG Interpretation None      MDM   Final diagnoses:  Dental caries   Penicillin and  follow up with dentist for ongoing care    Mckaylie Vasey K Calianne Larue-Rasch, MD 09/22/13 0202

## 2013-09-22 NOTE — ED Notes (Signed)
Pain in upper left tooth and lower left gum where a tooth was pulled a year ago.

## 2014-03-26 ENCOUNTER — Encounter (HOSPITAL_BASED_OUTPATIENT_CLINIC_OR_DEPARTMENT_OTHER): Payer: Self-pay | Admitting: Emergency Medicine

## 2014-03-26 ENCOUNTER — Emergency Department (HOSPITAL_BASED_OUTPATIENT_CLINIC_OR_DEPARTMENT_OTHER)
Admission: EM | Admit: 2014-03-26 | Discharge: 2014-03-26 | Disposition: A | Discharge status: Home / Self Care | Payer: Self-pay | Attending: Emergency Medicine | Admitting: Emergency Medicine

## 2014-03-26 DIAGNOSIS — Z792 Long term (current) use of antibiotics: Secondary | ICD-10-CM | POA: Insufficient documentation

## 2014-03-26 DIAGNOSIS — K029 Dental caries, unspecified: Secondary | ICD-10-CM | POA: Insufficient documentation

## 2014-03-26 DIAGNOSIS — K047 Periapical abscess without sinus: Secondary | ICD-10-CM | POA: Insufficient documentation

## 2014-03-26 DIAGNOSIS — K089 Disorder of teeth and supporting structures, unspecified: Secondary | ICD-10-CM | POA: Insufficient documentation

## 2014-03-26 MED ORDER — OXYCODONE-ACETAMINOPHEN 5-325 MG PO TABS
2.0000 | ORAL_TABLET | Freq: Once | ORAL | Status: AC
  Administered 2014-03-26: 2 via ORAL
  Filled 2014-03-26: qty 2

## 2014-03-26 MED ORDER — AMOXICILLIN 500 MG PO CAPS: 500.0000 mg | ORAL_CAPSULE | Freq: Three times a day (TID) | ORAL | Status: DC

## 2014-03-26 MED ORDER — OXYCODONE-ACETAMINOPHEN 5-325 MG PO TABS: ORAL_TABLET | ORAL | Status: DC

## 2014-03-26 MED ORDER — AMOXICILLIN 500 MG PO CAPS
500.0000 mg | ORAL_CAPSULE | Freq: Once | ORAL | Status: AC
  Administered 2014-03-26: 500 mg via ORAL
  Filled 2014-03-26: qty 1

## 2014-03-26 MED ORDER — BUPIVACAINE-EPINEPHRINE (PF) 0.5% -1:200000 IJ SOLN
1.8000 mL | Freq: Once | INTRAMUSCULAR | Status: AC
  Administered 2014-03-26: 1.8 mL
  Filled 2014-03-26: qty 1.8

## 2014-03-26 NOTE — Discharge Instructions (Signed)
Rinse the mouth with warm salt water once every hour this afternoon. Do not swallow blood or pus, spitting out.  Apply warm compresses to jaw throughout the day.   Take your antibiotics as directed and to completion. You should never have any leftover antibiotics! Push fluids and stay well hydrated.   Take percocet for breakthrough pain, do not drink alcohol, drive, care for children or do other critical tasks while taking percocet.  Return to the emergency room for fever, change in vision, redness to the face that rapidly spreads towards the eye, nausea or vomiting, difficulty swallowing or shortness of breath.   Followup with a dentist is very important for ongoing evaluation and management of recurrent dental pain. Return to emergency department for emergent changing or worsening symptoms.  Low-cost dental clinic: [facility]  at [phone]**  **[place] at [phone] [address]    You may also call [phone]  Dental Assistance If the dentist on-call cannot see you, please use the resources below:   Patients with Medicaid: [place] [phone] [address], [place] [address] [phone]  If unable to pay, or uninsured, contact [facility] [phone]) or [place] [phone] in [place], [place] in [facility]) to become qualified for the adult dental clinic  Other [place], [place], [place], [address]    [phone], Ext. [phone]    2nd and 4th Thursday of the month at 6:30am    10 clients each day by appointment, can sometimes see walk-in     patients if someone does not show for an appointment [place], [place], [place], [address]    [phone] [facility]- [address], [place], [place], [address]    [phone]  [place] Department- [phone] [facility] Department- [phone]

## 2014-03-26 NOTE — ED Notes (Signed)
Patient discharged to home with girlfriend . NAD.

## 2014-03-26 NOTE — ED Notes (Signed)
Pt here with dental pain in left upper jaw.  Pt has visible swelling to left cheek.  Pt has taken "leftover" penicillin x2 days.

## 2014-03-26 NOTE — ED Provider Notes (Signed)
CSN: [id number]     Arrival date & time 03/26/14  1123 History   First MD Initiated Contact with Patient 03/26/14 1201     Chief Complaint  Patient presents with  . Dental Pain     (Consider location/radiation/quality/duration/timing/severity/associated sxs/prior Treatment) HPI  [name] is a 33 y.o. male complaining of pain to left maxillary area onset 5 days ago with associated swelling noticed yesterday. Patient is taken ibuprofen at home with little relief. He denies fever, chills, difficulty swallowing, eye pain, change in vision, pain with eye movement. Pain is rated at 8/10  Past Medical History  Diagnosis Date  . Tooth infection    Past Surgical History  Procedure Laterality Date  . Arm surgery     Family History  Problem Relation Age of Onset  . Heart failure Neg Hx   . Heart attack Neg Hx   . Fainting Neg Hx    History  Substance Use Topics  . Smoking status: Never Smoker   . Smokeless tobacco: Not on file  . Alcohol Use: Yes     Comment: every other day    Review of Systems  10 systems reviewed and found to be negative, except as noted in the HPI.   Allergies  Review of patient's allergies indicates no known allergies.  Home Medications   Prior to Admission medications   Medication Sig Start Date End Date Taking? Authorizing Provider  amoxicillin (AMOXIL) 500 MG capsule Take 1 capsule (500 mg total) by mouth 3 (three) times daily. 03/26/14   Chrishaun Sasso, PA-C  diclofenac (VOLTAREN) 75 MG EC tablet Take 1 tablet (75 mg total) by mouth 2 (two) times daily. 09/22/13   April K Palumbo-Rasch, MD  oxyCODONE-acetaminophen (PERCOCET) 5-325 MG per tablet Take 1 tablet by mouth every 6 (six) hours as needed for pain. 02/26/13   April Alfonso Patten, MD  oxyCODONE-acetaminophen (PERCOCET/ROXICET) 5-325 MG per tablet 1 to 2 tabs PO q6hrs  PRN for pain 03/26/14   Elmyra Ricks Hampton Cost, PA-C   BP 130/55  Pulse 51  Temp(Src) 98.2 F (36.8 C) (Oral)  Resp 16  Ht 6'  3" (1.905 m)  Wt 255 lb (115.667 kg)  BMI 31.87 kg/m2  SpO2 100% Physical Exam  Nursing note and vitals reviewed. Constitutional: He is oriented to person, place, and time. He appears well-developed and well-nourished. No distress.  HENT:  Head: Normocephalic.  Mouth/Throat: Oropharynx is clear and moist.    Mild swelling to left maxillary area. Generally good dentition.. Patient is handling their secretions. There is no tenderness to palpation or firmness underneath tongue bilaterally. No trismus.    Eyes: Conjunctivae and EOM are normal. Pupils are equal, round, and reactive to light.  Neck: Normal range of motion.  Cardiovascular: Normal rate.   Pulmonary/Chest: Effort normal. No stridor.  Abdominal: Soft.  Musculoskeletal: Normal range of motion.  Lymphadenopathy:    He has no cervical adenopathy.  Neurological: He is alert and oriented to person, place, and time.  Psychiatric: He has a normal mood and affect.    ED Course  INCISION AND DRAINAGE Date/Time: 03/26/2014 1:30 PM Performed by: Monico Blitz Authorized by: Monico Blitz Consent: Verbal consent obtained. Consent given by: patient Patient identity confirmed: verbally with patient Type: abscess Body area: mouth Location details: alveolar process Anesthesia: local infiltration Local anesthetic: bupivacaine 0.5% with epinephrine Anesthetic total: 1.8 ml Patient sedated: no Scalpel size: 11 Incision type: single straight Drainage: purulent Wound treatment: wound left open Packing material: none Patient tolerance:  Patient tolerated the procedure well with no immediate complications.   (including critical care time) Labs Review Labs Reviewed - No data to display  Imaging Review No results found.   EKG Interpretation None      MDM   Final diagnoses:  Dental abscess  Dental caries    Filed Vitals:   03/26/14 1134 03/26/14 1328 03/26/14 1336  BP: 161/90 131/67 130/55  Pulse: 92 78 51    Temp: 99 F (37.2 C)  98.2 F (36.8 C)  TempSrc: Oral  Oral  Resp: 22 18 16   Height: 6\' 3"  (1.905 m)    Weight: 255 lb (115.667 kg)    SpO2: 98% 100% 100%    Medications  oxyCODONE-acetaminophen (PERCOCET/ROXICET) 5-325 MG per tablet 2 tablet (2 tablets Oral Given 03/26/14 1252)  bupivacaine-epinephrine (MARCAINE W/ EPI) 0.5% -1:200000 injection 1.8 mL (1.8 mLs Infiltration Given 03/26/14 1252)  amoxicillin (AMOXIL) capsule 500 mg (500 mg Oral Given 03/26/14 1252)    [name] is a 33 y.o. male presenting with dental pain and abscess. I&D performed. Patient will be given dental referral and started on antibiotics.  Evaluation does not show pathology that would require ongoing emergent intervention or inpatient treatment. Pt is hemodynamically stable and mentating appropriately. Discussed findings and plan with patient/guardian, who agrees with care plan. All questions answered. Return precautions discussed and outpatient follow up given.   Discharge Medication List as of 03/26/2014  1:23 PM    START taking these medications   Details  amoxicillin (AMOXIL) 500 MG capsule Take 1 capsule (500 mg total) by mouth 3 (three) times daily., Starting 03/26/2014, Until Discontinued, Print    !! oxyCODONE-acetaminophen (PERCOCET/ROXICET) 5-325 MG per tablet 1 to 2 tabs PO q6hrs  PRN for pain, Print     !! - Potential duplicate medications found. Please discuss with provider.           Monico Blitz, PA-C 03/26/14 1339

## 2014-03-28 NOTE — ED Provider Notes (Signed)
Medical screening examination/treatment/procedure(s) were performed by non-physician practitioner and as supervising physician I was immediately available for consultation/collaboration.  Ernestina Patches, MD 03/28/14 1018

## 2014-04-21 ENCOUNTER — Emergency Department (HOSPITAL_BASED_OUTPATIENT_CLINIC_OR_DEPARTMENT_OTHER)
Admission: EM | Admit: 2014-04-21 | Discharge: 2014-04-21 | Disposition: A | Discharge status: Home / Self Care | Payer: Self-pay | Attending: Emergency Medicine | Admitting: Emergency Medicine

## 2014-04-21 ENCOUNTER — Emergency Department (HOSPITAL_BASED_OUTPATIENT_CLINIC_OR_DEPARTMENT_OTHER): Payer: Self-pay

## 2014-04-21 ENCOUNTER — Encounter (HOSPITAL_BASED_OUTPATIENT_CLINIC_OR_DEPARTMENT_OTHER): Payer: Self-pay | Admitting: Emergency Medicine

## 2014-04-21 DIAGNOSIS — R0602 Shortness of breath: Secondary | ICD-10-CM | POA: Insufficient documentation

## 2014-04-21 DIAGNOSIS — Z8719 Personal history of other diseases of the digestive system: Secondary | ICD-10-CM | POA: Insufficient documentation

## 2014-04-21 DIAGNOSIS — Z792 Long term (current) use of antibiotics: Secondary | ICD-10-CM | POA: Insufficient documentation

## 2014-04-21 DIAGNOSIS — Z791 Long term (current) use of non-steroidal anti-inflammatories (NSAID): Secondary | ICD-10-CM | POA: Insufficient documentation

## 2014-04-21 DIAGNOSIS — R202 Paresthesia of skin: Secondary | ICD-10-CM | POA: Insufficient documentation

## 2014-04-21 DIAGNOSIS — R0789 Other chest pain: Secondary | ICD-10-CM | POA: Insufficient documentation

## 2014-04-21 LAB — BASIC METABOLIC PANEL
ANION GAP: 15 (ref 5–15)
BUN: 11 mg/dL (ref 6–23)
CALCIUM: 10.2 mg/dL (ref 8.4–10.5)
CO2: 25 mEq/L (ref 19–32)
Chloride: 100 mEq/L (ref 96–112)
Creatinine, Ser: 1 mg/dL (ref 0.50–1.35)
GFR calc Af Amer: >90 mL/min (ref >90)
Glucose, Bld: 95 mg/dL (ref 70–99)
POTASSIUM: 3.5 meq/L — AB (ref 3.7–5.3)
SODIUM: 140 meq/L (ref 137–147)

## 2014-04-21 LAB — TROPONIN I

## 2014-04-21 LAB — CBC WITH DIFFERENTIAL/PLATELET
BASOS ABS: 0 10*3/uL (ref 0.0–0.1)
Basophils Relative: 0 % (ref 0–1)
EOS ABS: 0.2 10*3/uL (ref 0.0–0.7)
EOS PCT: 3 % (ref 0–5)
HCT: 42.1 % (ref 39.0–52.0)
Hemoglobin: 14.3 g/dL (ref 13.0–17.0)
LYMPHS PCT: 33 % (ref 12–46)
Lymphs Abs: 2.1 10*3/uL (ref 0.7–4.0)
MCH: 32.3 pg (ref 26.0–34.0)
MCHC: 34 g/dL (ref 30.0–36.0)
MCV: 95 fL (ref 78.0–100.0)
Monocytes Absolute: 0.6 10*3/uL (ref 0.1–1.0)
Monocytes Relative: 9 % (ref 3–12)
NEUTROS PCT: 55 % (ref 43–77)
Neutro Abs: 3.5 10*3/uL (ref 1.7–7.7)
PLATELETS: 214 10*3/uL (ref 150–400)
RBC: 4.43 MIL/uL (ref 4.22–5.81)
RDW: 11.7 % (ref 11.5–15.5)
WBC: 6.4 10*3/uL (ref 4.0–10.5)

## 2014-04-21 NOTE — ED Notes (Signed)
Pt states that he started having numbness in left arm today followed by numbness to left side.  Pt states he was lifting a lot of boxes today for 10 hrs.  Pt states this has happened before and he has been seen but never followed up with who they told him to follow up with.  Pt states that numbness has gone away but still having pain in lower back.

## 2014-04-21 NOTE — ED Notes (Addendum)
C/o numbness to left arm then to left leg and left side of neck numbness approx 430pm later central chest tightness started

## 2014-04-21 NOTE — Discharge Instructions (Signed)

## 2014-04-21 NOTE — ED Provider Notes (Signed)
CSN: [id number]     Arrival date & time 04/21/14  1937 History   First MD Initiated Contact with Patient 04/21/14 2004     Chief Complaint  Patient presents with  . Numbness     (Consider location/radiation/quality/duration/timing/severity/associated sxs/prior Treatment) HPI Comments: Patient with no significant past medical history presents with tingling to his left arm and left foot. He states that earlier today he started having some tingling in his left arm. He also noticed some tingling in his left foot in the left side of his neck. He felt like there is some muscle spasms in the left side of his back. He also noted some heaviness across the left side of his chest. He describes some mild shortness of breath. He had no nausea vomiting or diaphoresis. The symptoms are nonexertional. He denies any balance problems. He denies any speech deficits or vision changes. He denies any headache. He denies any neck pain. He denies any cough or chest congestion. He states the symptoms are coming going for about 2 hours. He states his symptoms have subsequently resolved. He's had the same kind of numbness in his left arm and left foot several times in the past. He's been referred for outpatient followup but has not followed up due to insurance reasons.   Past Medical History  Diagnosis Date  . Tooth infection    Past Surgical History  Procedure Laterality Date  . Arm surgery     Family History  Problem Relation Age of Onset  . Heart failure Neg Hx   . Heart attack Neg Hx   . Fainting Neg Hx    History  Substance Use Topics  . Smoking status: Never Smoker   . Smokeless tobacco: Not on file  . Alcohol Use: Yes     Comment: every other day    Review of Systems  Constitutional: Negative for fever, chills, diaphoresis and fatigue.  HENT: Negative for congestion, rhinorrhea and sneezing.   Eyes: Negative.   Respiratory: Positive for chest tightness and shortness of breath. Negative for cough.    Cardiovascular: Positive for chest pain. Negative for leg swelling.  Gastrointestinal: Negative for nausea, vomiting, abdominal pain, diarrhea and blood in stool.  Genitourinary: Negative for frequency, hematuria, flank pain and difficulty urinating.  Musculoskeletal: Negative for arthralgias and back pain.  Skin: Negative for rash.  Neurological: Positive for numbness. Negative for dizziness, speech difficulty, weakness and headaches.      Allergies  Review of patient's allergies indicates no known allergies.  Home Medications   Prior to Admission medications   Medication Sig Start Date End Date Taking? Authorizing Provider  amoxicillin (AMOXIL) 500 MG capsule Take 1 capsule (500 mg total) by mouth 3 (three) times daily. 03/26/14   Nicole Pisciotta, PA-C  diclofenac (VOLTAREN) 75 MG EC tablet Take 1 tablet (75 mg total) by mouth 2 (two) times daily. 09/22/13   April K Palumbo-Rasch, MD  oxyCODONE-acetaminophen (PERCOCET) 5-325 MG per tablet Take 1 tablet by mouth every 6 (six) hours as needed for pain. 02/26/13   April Alfonso Patten, MD  oxyCODONE-acetaminophen (PERCOCET/ROXICET) 5-325 MG per tablet 1 to 2 tabs PO q6hrs  PRN for pain 03/26/14   Elmyra Ricks Pisciotta, PA-C   BP 145/78  Pulse 70  Resp 20  Ht 6\' 3"  (1.905 m)  Wt 260 lb (117.935 kg)  BMI 32.50 kg/m2  SpO2 100% Physical Exam  Constitutional: He is oriented to person, place, and time. He appears well-developed and well-nourished.  HENT:  Head:  Normocephalic and atraumatic.  Eyes: Pupils are equal, round, and reactive to light.  Neck: Normal range of motion. Neck supple.  No tenderness along the cervical thoracic or lumbosacral spine  Cardiovascular: Normal rate, regular rhythm and normal heart sounds.   Pulmonary/Chest: Effort normal and breath sounds normal. No respiratory distress. He has no wheezes. He has no rales. He exhibits no tenderness.  Radial pulses symmetric  Abdominal: Soft. Bowel sounds are normal. There is no  tenderness. There is no rebound and no guarding.  Musculoskeletal: Normal range of motion. He exhibits no edema.  Lymphadenopathy:    He has no cervical adenopathy.  Neurological: He is alert and oriented to person, place, and time.  Motor 55 all extremities. Sensation grossly intact to light touch all extremities. Cranial nerves II through XII grossly intact. No pronator shift. Finger to nose intact. Gait normal. He can do a heel walk and toe walk without problem.  Skin: Skin is warm and dry. No rash noted.  Psychiatric: He has a normal mood and affect.    ED Course  Procedures (including critical care time) Labs Review Results for orders placed during the hospital encounter of 04/21/14  CBC WITH DIFFERENTIAL      Result Value Ref Range   WBC 6.4  4.0 - 10.5 K/uL   RBC 4.43  4.22 - 5.81 MIL/uL   Hemoglobin 14.3  13.0 - 17.0 g/dL   HCT 42.1  39.0 - 52.0 %   MCV 95.0  78.0 - 100.0 fL   MCH 32.3  26.0 - 34.0 pg   MCHC 34.0  30.0 - 36.0 g/dL   RDW 11.7  11.5 - 15.5 %   Platelets 214  150 - 400 K/uL   Neutrophils Relative % 55  43 - 77 %   Neutro Abs 3.5  1.7 - 7.7 K/uL   Lymphocytes Relative 33  12 - 46 %   Lymphs Abs 2.1  0.7 - 4.0 K/uL   Monocytes Relative 9  3 - 12 %   Monocytes Absolute 0.6  0.1 - 1.0 K/uL   Eosinophils Relative 3  0 - 5 %   Eosinophils Absolute 0.2  0.0 - 0.7 K/uL   Basophils Relative 0  0 - 1 %   Basophils Absolute 0.0  0.0 - 0.1 K/uL  BASIC METABOLIC PANEL      Result Value Ref Range   Sodium 140  137 - 147 mEq/L   Potassium 3.5 (*) 3.7 - 5.3 mEq/L   Chloride 100  96 - 112 mEq/L   CO2 25  19 - 32 mEq/L   Glucose, Bld 95  70 - 99 mg/dL   BUN 11  6 - 23 mg/dL   Creatinine, Ser 1.00  0.50 - 1.35 mg/dL   Calcium 10.2  8.4 - 10.5 mg/dL   GFR calc non Af Amer >90  >90 mL/min   GFR calc Af Amer >90  >90 mL/min   Anion gap 15  5 - 15  TROPONIN I      Result Value Ref Range   Troponin I <0.30  <0.30 ng/mL   Dg Chest 2 View  04/21/2014   CLINICAL DATA:   Numbness in the left arm with numbness in the left side. Lifting boxes today for 2 hr. Low back pain.  EXAM: CHEST  2 VIEW  COMPARISON:  04/29/2011  FINDINGS: Linear atelectasis in left lung base. No focal airspace disease or consolidation. No blunting of costophrenic angles. No pneumothorax. Heart  size and pulmonary vascularity are normal.  IMPRESSION: Linear atelectasis in the left lung base.   Electronically Signed   By: Lucienne Capers M.D.   On: 04/21/2014 21:02      Imaging Review Dg Chest 2 View  04/21/2014   CLINICAL DATA:  Numbness in the left arm with numbness in the left side. Lifting boxes today for 2 hr. Low back pain.  EXAM: CHEST  2 VIEW  COMPARISON:  04/29/2011  FINDINGS: Linear atelectasis in left lung base. No focal airspace disease or consolidation. No blunting of costophrenic angles. No pneumothorax. Heart size and pulmonary vascularity are normal.  IMPRESSION: Linear atelectasis in the left lung base.   Electronically Signed   By: Lucienne Capers M.D.   On: 04/21/2014 21:02     EKG Interpretation None       Date: 04/21/2014  Rate: 77  Rhythm: normal sinus rhythm  QRS Axis: normal  Intervals: normal  ST/T Wave abnormalities: normal  Conduction Disutrbances:none  Narrative Interpretation:   Old EKG Reviewed: unchanged   MDM   Final diagnoses:  Paresthesias    Patient presents with intermittent numbness of his left arm and left foot. He has no weakness. He has no other symptoms that would be suggestive of a stroke. He doesn't have any risk factors for stroke. He does have some chest pain but no other associated symptoms. His troponin is negative his EKG did not show any ischemic changes. He has a low heart score. He has no symptoms that would be more suggestive of an aortic dissection. His paresthesias potentially could be coming from cervical impingement however he has no persistent or worsening symptoms, they just seem intermittent. He's had similar symptoms  intermittently in the past. He was discharged home in good condition. They gave him referral to follow with the current health and wellness center or return here as needed for any worsening symptoms.    Malvin Johns, MD 04/21/14 2117

## 2015-06-11 ENCOUNTER — Emergency Department (HOSPITAL_BASED_OUTPATIENT_CLINIC_OR_DEPARTMENT_OTHER)
Admission: EM | Admit: 2015-06-11 | Discharge: 2015-06-11 | Disposition: A | Discharge status: Home / Self Care | Payer: Self-pay | Attending: Emergency Medicine | Admitting: Emergency Medicine

## 2015-06-11 ENCOUNTER — Other Ambulatory Visit: Payer: Self-pay

## 2015-06-11 ENCOUNTER — Encounter (HOSPITAL_BASED_OUTPATIENT_CLINIC_OR_DEPARTMENT_OTHER): Payer: Self-pay | Admitting: *Deleted

## 2015-06-11 DIAGNOSIS — R202 Paresthesia of skin: Secondary | ICD-10-CM | POA: Insufficient documentation

## 2015-06-11 DIAGNOSIS — I10 Essential (primary) hypertension: Secondary | ICD-10-CM | POA: Insufficient documentation

## 2015-06-11 DIAGNOSIS — Z79899 Other long term (current) drug therapy: Secondary | ICD-10-CM | POA: Insufficient documentation

## 2015-06-11 DIAGNOSIS — Z792 Long term (current) use of antibiotics: Secondary | ICD-10-CM | POA: Insufficient documentation

## 2015-06-11 DIAGNOSIS — E876 Hypokalemia: Secondary | ICD-10-CM | POA: Insufficient documentation

## 2015-06-11 LAB — CBC WITH DIFFERENTIAL/PLATELET
Basophils Absolute: 0 10*3/uL (ref 0.0–0.1)
Basophils Relative: 0 %
EOS ABS: 0 10*3/uL (ref 0.0–0.7)
EOS PCT: 1 %
HCT: 42.3 % (ref 39.0–52.0)
Hemoglobin: 14.6 g/dL (ref 13.0–17.0)
LYMPHS ABS: 1.4 10*3/uL (ref 0.7–4.0)
LYMPHS PCT: 24 %
MCH: 32.4 pg (ref 26.0–34.0)
MCHC: 34.5 g/dL (ref 30.0–36.0)
MCV: 94 fL (ref 78.0–100.0)
MONO ABS: 0.4 10*3/uL (ref 0.1–1.0)
MONOS PCT: 8 %
Neutro Abs: 3.9 10*3/uL (ref 1.7–7.7)
Neutrophils Relative %: 67 %
PLATELETS: 268 10*3/uL (ref 150–400)
RBC: 4.5 MIL/uL (ref 4.22–5.81)
RDW: 11.7 % (ref 11.5–15.5)
WBC: 5.7 10*3/uL (ref 4.0–10.5)

## 2015-06-11 LAB — BASIC METABOLIC PANEL
Anion gap: 10 (ref 5–15)
BUN: 14 mg/dL (ref 6–20)
CALCIUM: 9.9 mg/dL (ref 8.9–10.3)
CO2: 23 mmol/L (ref 22–32)
CREATININE: 1 mg/dL (ref 0.61–1.24)
Chloride: 102 mmol/L (ref 101–111)
GFR calc Af Amer: >60 mL/min (ref >60)
GLUCOSE: 115 mg/dL — AB (ref 65–99)
POTASSIUM: 3.3 mmol/L — AB (ref 3.5–5.1)
SODIUM: 135 mmol/L (ref 135–145)

## 2015-06-11 MED ORDER — POTASSIUM CHLORIDE CRYS ER 20 MEQ PO TBCR
20.0000 meq | EXTENDED_RELEASE_TABLET | Freq: Once | ORAL | Status: AC
  Administered 2015-06-11: 20 meq via ORAL
  Filled 2015-06-11: qty 1

## 2015-06-11 NOTE — ED Notes (Addendum)
Numbness to the left side of his body off and on for the past 2 years. States he has been seen for same in past with no diagnosis given. Neuro intact.

## 2015-06-11 NOTE — ED Provider Notes (Signed)
CSN: [id number]     Arrival date & time 06/11/15  1129 History   First MD Initiated Contact with Patient 06/11/15 1319     Chief Complaint  Patient presents with  . Numbness     (Consider location/radiation/quality/duration/timing/severity/associated sxs/prior Treatment) HPI Comments: [name] is a 34 y.o. male with a PMHx of chronic paresthesias, who presents to the ED with complaints of intermittent left sided body tingling mostly in the left hand and left leg, ongoing for 2 years intermittently, occurred again this morning lasting approximately 1 hour and then resolved upon arrival to the ER. Patient states that his symptoms are currently resolved. He denies any fevers, chills, chest pain, shortness breath, lightheadedness, diaphoresis, leg swelling, recent travel/surgery/immobilization, history of DVT/PE, claudication, orthopnea, abdominal pain, nausea, vomiting, diarrhea, constipation, dysuria, hematuria, numbness, weakness, headache, or vision changes.  He has been told he has high blood pressure in the past, but he has not followed up with anybody. He has no primary care doctor.  Patient is a 34 y.o. male presenting with general illness. The history is provided by the patient and medical records. No language interpreter was used.  Illness Location:  L body/arm/leg tinglign Quality:  Tingling Severity:  Mild Onset quality:  Gradual Duration:  24 months Timing:  Intermittent Progression:  Resolved Chronicity:  Chronic Context:  None Relieved by:  Nothing tried Worsened by:  Nothing tried Ineffective treatments:  Nothing tried Associated symptoms: no abdominal pain, no chest pain, no diarrhea, no fever, no headaches, no myalgias, no nausea, no shortness of breath and no vomiting     Past Medical History  Diagnosis Date  . Tooth infection    Past Surgical History  Procedure Laterality Date  . Arm surgery     Family History  Problem Relation Age of Onset  . Heart  failure Neg Hx   . Heart attack Neg Hx   . Fainting Neg Hx    Social History  Substance Use Topics  . Smoking status: Never Smoker   . Smokeless tobacco: None  . Alcohol Use: Yes     Comment: every other day    Review of Systems  Constitutional: Negative for fever, chills and diaphoresis.  Eyes: Negative for visual disturbance.  Respiratory: Negative for shortness of breath.   Cardiovascular: Negative for chest pain.  Gastrointestinal: Negative for nausea, vomiting, abdominal pain, diarrhea and constipation.  Genitourinary: Negative for dysuria and hematuria.  Musculoskeletal: Negative for myalgias, back pain, arthralgias and neck pain.  Skin: Negative for color change.  Allergic/Immunologic: Negative for immunocompromised state.  Neurological: Negative for weakness, light-headedness, numbness and headaches.       +tingling in L arm/leg and L body intermittently, currently resolved  Psychiatric/Behavioral: Negative for confusion.   10 Systems reviewed and are negative for acute change except as noted in the HPI.    Allergies  Review of patient's allergies indicates no known allergies.  Home Medications   Prior to Admission medications   Medication Sig Start Date End Date Taking? Authorizing Provider  amoxicillin (AMOXIL) 500 MG capsule Take 1 capsule (500 mg total) by mouth 3 (three) times daily. 03/26/14   Nicole Pisciotta, PA-C  diclofenac (VOLTAREN) 75 MG EC tablet Take 1 tablet (75 mg total) by mouth 2 (two) times daily. 09/22/13   April Palumbo, MD  oxyCODONE-acetaminophen (PERCOCET) 5-325 MG per tablet Take 1 tablet by mouth every 6 (six) hours as needed for pain. 02/26/13   April Palumbo, MD  oxyCODONE-acetaminophen (PERCOCET/ROXICET) 5-325 MG per  tablet 1 to 2 tabs PO q6hrs  PRN for pain 03/26/14   Elmyra Ricks Pisciotta, PA-C   Triage VS: BP 159/102 mmHg  Pulse 72  Temp(Src) 98.1 F (36.7 C) (Oral)  Resp 20  Ht 6\' 3"  (1.905 m)  Wt 115.667 kg  BMI 31.87 kg/m2  SpO2  100% Exam VS: BP 155/90 mmHg  Pulse 70  Temp(Src) 98.1 F (36.7 C) (Oral)  Resp 20  Ht 6\' 3"  (1.905 m)  Wt 115.667 kg  BMI 31.87 kg/m2  SpO2 100%  Physical Exam  Constitutional: He is oriented to person, place, and time. Vital signs are normal. He appears well-developed and well-nourished.  Non-toxic appearance. No distress.  Afebrile, nontoxic, NAD  HENT:  Head: Normocephalic and atraumatic.  Mouth/Throat: Oropharynx is clear and moist and mucous membranes are normal.  Eyes: Conjunctivae and EOM are normal. Right eye exhibits no discharge. Left eye exhibits no discharge.  Neck: Normal range of motion. Neck supple. No spinous process tenderness and no muscular tenderness present. No rigidity. Normal range of motion present.  FROM intact without spinous process TTP, no bony stepoffs or deformities, no paraspinous muscle TTP or muscle spasms. No rigidity or meningeal signs. No bruising or swelling.   Cardiovascular: Normal rate, regular rhythm, normal heart sounds and intact distal pulses.  Exam reveals no gallop and no friction rub.   No murmur heard. Pulmonary/Chest: Effort normal and breath sounds normal. No respiratory distress. He has no decreased breath sounds. He has no wheezes. He has no rhonchi. He has no rales.  Abdominal: Soft. Normal appearance and bowel sounds are normal. He exhibits no distension. There is no tenderness. There is no rigidity, no rebound, no guarding, no CVA tenderness, no tenderness at McBurney's point and negative Murphy's sign.  Musculoskeletal: Normal range of motion.  MAE x4 Strength and sensation grossly intact Distal pulses intact No pedal edema Gait steady No focal tenderness in all extremities  Neurological: He is alert and oriented to person, place, and time. He has normal strength. No cranial nerve deficit or sensory deficit. Coordination and gait normal. GCS eye subscore is 4. GCS verbal subscore is 5. GCS motor subscore is 6.  CN 2-12 grossly  intact A&O x4 GCS 15 Sensation and strength intact Gait nonataxic Coordination with WNL  Skin: Skin is warm, dry and intact. No rash noted.  Psychiatric: He has a normal mood and affect.  Nursing note and vitals reviewed.   ED Course  Procedures (including critical care time) Labs Review Labs Reviewed  BASIC METABOLIC PANEL - Abnormal; Notable for the following:    Potassium 3.3 (*)    Glucose, Bld 115 (*)    All other components within normal limits  CBC WITH DIFFERENTIAL/PLATELET    Imaging Review No results found. I have personally reviewed and evaluated these images and lab results as part of my medical decision-making.   EKG Interpretation   Date/Time:  Monday June 11 2015 11:51:57 EST Ventricular Rate:  91 PR Interval:  152 QRS Duration: 84 QT Interval:  344 QTC Calculation: 423 R Axis:   83 Text Interpretation:  Normal sinus rhythm T wave abnormality, consider  inferior ischemia Abnormal ECG TWI in anterolateral leads, seen on prior  EKG in 2010 No significant change since last tracing Confirmed by LIU MD,  DANA [id number]) on 06/11/2015 1:07:15 PM      MDM   Final diagnoses:  Paresthesia  HTN (hypertension), benign  Hypokalemia    34 y.o. male here with ongoing  intermittent paresthesias x2 yrs. Has been seen in the past for similar symptoms, no diagnosis, was supposed to f/up but hasn't. No neck pain, no focal bony tenderness, NVI in all extremities. No CP/SOB complaints. Has been noted to have BP in the 150s/90s range in the past, today 155/90. No focal neuro deficits, no other symptoms of HTN, and paresthesias are currently resolved. Doubt need for emergent evaluation of this, no HA or vision changes, labs unremarkable aside from mildly low K, will give Kdur here but will have him f/up with [facility] in 1-2wks for ongoing monitoring of his BP and his symptoms. EKG unremarkable today. Discussed diet modifications for BP. Will have him f/up with [facility]. I explained  the diagnosis and have given explicit precautions to return to the ER including for any other new or worsening symptoms. The patient understands and accepts the medical plan as it's been dictated and I have answered their questions. Discharge instructions concerning home care and prescriptions have been given. The patient is STABLE and is discharged to home in good condition.  BP 155/90 mmHg  Pulse 70  Temp(Src) 98.1 F (36.7 C) (Oral)  Resp 20  Ht 6\' 3"  (1.905 m)  Wt 115.667 kg  BMI 31.87 kg/m2  SpO2 100%  Meds ordered this encounter  Medications  . potassium chloride SA (K-DUR,KLOR-CON) CR tablet 20 mEq    Sig:        Jenne Sellinger Camprubi-Soms, PA-C 06/11/15 1631  Forde Dandy, MD 06/11/15 2022

## 2015-06-11 NOTE — Discharge Instructions (Signed)
Your symptoms could be from your blood pressure. Eat a well balanced diet, low sodium, low caffeine. Follow up with [facility] in 1-2 weeks to establish care and follow up on today's visit. Return to the ER for changes or worsening symptoms.   Paresthesia Paresthesia is an abnormal burning or prickling sensation. This sensation is generally felt in the hands, arms, legs, or feet. However, it may occur in any part of the body. Usually, it is not painful. The feeling may be described as:  Tingling or numbness.  Pins and needles.  Skin crawling.  Buzzing.  Limbs falling asleep.  Itching. Most people experience temporary (transient) paresthesia at some time in their lives. Paresthesia may occur when you breathe too quickly (hyperventilation). It can also occur without any apparent cause. Commonly, paresthesia occurs when pressure is placed on a nerve. The sensation quickly goes away after the pressure is removed. For some people, however, paresthesia is a long-lasting (chronic) condition that is caused by an underlying disorder. If you continue to have paresthesia, you may need further medical evaluation. HOME CARE INSTRUCTIONS Watch your condition for any changes. Taking the following actions may help to lessen any discomfort that you are feeling:  Avoid drinking alcohol.  Try acupuncture or massage to help relieve your symptoms.  Keep all follow-up visits as directed by your health care provider. This is important. SEEK MEDICAL CARE IF:  You continue to have episodes of paresthesia.  Your burning or prickling feeling gets worse when you walk.  You have pain, cramps, or dizziness.  You develop a rash. SEEK IMMEDIATE MEDICAL CARE IF:  You feel weak.  You have trouble walking or moving.  You have problems with speech, understanding, or vision.  You feel confused.  You cannot control your bladder or bowel movements.  You have numbness after an injury.  You  faint.   This information is not intended to replace advice given to you by your health care provider. Make sure you discuss any questions you have with your health care provider.   Document Released: 06/27/2002 Document Revised: 11/21/2014 Document Reviewed: 07/03/2014 Elsevier Interactive Patient Education 2016 [employer].  Hypokalemia Hypokalemia means that the amount of potassium in the blood is lower than normal.Potassium is a chemical, called an electrolyte, that helps regulate the amount of fluid in the body. It also stimulates muscle contraction and helps nerves function properly.Most of the body's potassium is inside of cells, and only a very small amount is in the blood. Because the amount in the blood is so small, minor changes can be life-threatening. CAUSES  Antibiotics.  Diarrhea or vomiting.  Using laxatives too much, which can cause diarrhea.  Chronic kidney disease.  Water pills (diuretics).  Eating disorders (bulimia).  Low magnesium level.  Sweating a lot. SIGNS AND SYMPTOMS  Weakness.  Constipation.  Fatigue.  Muscle cramps.  Mental confusion.  Skipped heartbeats or irregular heartbeat (palpitations).  Tingling or numbness. DIAGNOSIS  Your health care provider can diagnose hypokalemia with blood tests. In addition to checking your potassium level, your health care provider may also check other lab tests. TREATMENT Hypokalemia can be treated with potassium supplements taken by mouth or adjustments in your current medicines. If your potassium level is very low, you may need to get potassium through a vein (IV) and be monitored in the hospital. A diet high in potassium is also helpful. Foods high in potassium are:  Nuts, such as peanuts and pistachios.  Seeds, such as  sunflower seeds and pumpkin seeds.  Peas, lentils, and lima beans.  Whole grain and bran cereals and breads.  Fresh fruit and vegetables, such as apricots, avocado, bananas,  cantaloupe, kiwi, oranges, tomatoes, asparagus, and potatoes.  Orange and tomato juices.  Red meats.  Fruit yogurt. HOME CARE INSTRUCTIONS  Take all medicines as prescribed by your health care provider.  Maintain a healthy diet by including nutritious food, such as fruits, vegetables, nuts, whole grains, and lean meats.  If you are taking a laxative, be sure to follow the directions on the label. SEEK MEDICAL CARE IF:  Your weakness gets worse.  You feel your heart pounding or racing.  You are vomiting or having diarrhea.  You are diabetic and having trouble keeping your blood glucose in the normal range. SEEK IMMEDIATE MEDICAL CARE IF:  You have chest pain, shortness of breath, or dizziness.  You are vomiting or having diarrhea for more than 2 days.  You faint. MAKE SURE YOU:   Understand these instructions.  Will watch your condition.  Will get help right away if you are not doing well or get worse.   This information is not intended to replace advice given to you by your health care provider. Make sure you discuss any questions you have with your health care provider.   Document Released: 07/07/2005 Document Revised: 07/28/2014 Document Reviewed: 01/07/2013 Elsevier Interactive Patient Education 2016 [employer].  How to Take Your Blood Pressure HOW DO I GET A BLOOD PRESSURE MACHINE?  You can buy an electronic home blood pressure machine at your local pharmacy. Insurance will sometimes cover the cost if you have a prescription.  Ask your doctor what type of machine is best for you. There are different machines for your arm and your wrist.  If you decide to buy a machine to check your blood pressure on your arm, first check the size of your arm so you can buy the right size cuff. To check the size of your arm:   Use a measuring tape that shows both inches and centimeters.   Wrap the measuring tape around the upper-middle part of your arm. You may need  someone to help you measure.   Write down your arm measurement in both inches and centimeters.   To measure your blood pressure correctly, it is important to have the right size cuff.   If your arm is up to 13 inches (up to 34 centimeters), get an adult cuff size.  If your arm is 13 to 17 inches (35 to 44 centimeters), get a large adult cuff size.    If your arm is 17 to 20 inches (45 to 52 centimeters), get an adult thigh cuff.  WHAT DO THE NUMBERS MEAN?   There are two numbers that make up your blood pressure. For example: 120/80.  The first number (120 in our example) is called the "systolic pressure." It is a measure of the pressure in your blood vessels when your heart is pumping blood.  The second number (80 in our example) is called the "diastolic pressure." It is a measure of the pressure in your blood vessels when your heart is resting between beats.  Your doctor will tell you what your blood pressure should be. WHAT SHOULD I DO BEFORE I CHECK MY BLOOD PRESSURE?   Try to rest or relax for at least 30 minutes before you check your blood pressure.  Do not smoke.  Do not have any drinks with caffeine, such as:  Soda.  Coffee.  Tea.  Check your blood pressure in a quiet room.  Sit down and stretch out your arm on a table. Keep your arm at about the level of your heart. Let your arm relax.  Make sure that your legs are not crossed. HOW DO I CHECK MY BLOOD PRESSURE?  Follow the directions that came with your machine.  Make sure you remove any tight-fitting clothing from your arm or wrist. Wrap the cuff around your upper arm or wrist. You should be able to fit a finger between the cuff and your arm. If you cannot fit a finger between the cuff and your arm, it is too tight and should be removed and rewrapped.  Some units require you to manually pump up the arm cuff.  Automatic units inflate the cuff when you press a button.  Cuff deflation is automatic in both  models.  After the cuff is inflated, the unit measures your blood pressure and pulse. The readings are shown on a monitor. Hold still and breathe normally while the cuff is inflated.  Getting a reading takes less than a minute.  Some models store readings in a memory. Some provide a printout of readings. If your machine does not store your readings, keep a written record.  Take readings with you to your next visit with your doctor.   This information is not intended to replace advice given to you by your health care provider. Make sure you discuss any questions you have with your health care provider.   Document Released: 06/19/2008 Document Revised: 07/28/2014 Document Reviewed: 09/01/2013 Elsevier Interactive Patient Education 2016 [place] DASH stands for "Dietary Approaches to Stop Hypertension." The DASH eating plan is a healthy eating plan that has been shown to reduce high blood pressure (hypertension). Additional health benefits may include reducing the risk of type 2 diabetes mellitus, heart disease, and stroke. The DASH eating plan may also help with weight loss. WHAT DO I NEED TO KNOW ABOUT THE DASH EATING PLAN? For the DASH eating plan, you will follow these general guidelines:  Choose foods with a percent daily value for sodium of less than 5% (as listed on the food label).  Use salt-free seasonings or herbs instead of table salt or sea salt.  Check with your health care provider or pharmacist before using salt substitutes.  Eat lower-sodium products, often labeled as "lower sodium" or "no salt added."  Eat fresh foods.  Eat more vegetables, fruits, and low-fat dairy products.  Choose whole grains. Look for the word "whole" as the first word in the ingredient list.  Choose fish and skinless chicken or [place] more often than red meat. Limit fish, poultry, and meat to 6 oz (170 g) each day.  Limit sweets, desserts, sugars, and sugary  drinks.  Choose heart-healthy fats.  Limit cheese to 1 oz (28 g) per day.  Eat more home-cooked food and less restaurant, buffet, and fast food.  Limit fried foods.  Cook foods using methods other than frying.  Limit canned vegetables. If you do use them, rinse them well to decrease the sodium.  When eating at a restaurant, ask that your food be prepared with less salt, or no salt if possible. WHAT FOODS CAN I EAT? Seek help from a dietitian for individual calorie needs. Grains Whole grain or whole wheat bread. Brown rice. Whole grain or whole wheat pasta. Quinoa, bulgur, and whole grain cereals. Low-sodium cereals. Corn or whole wheat flour  tortillas. Whole grain cornbread. Whole grain crackers. Low-sodium crackers. Vegetables Fresh or frozen vegetables (raw, steamed, roasted, or grilled). Low-sodium or reduced-sodium tomato and vegetable juices. Low-sodium or reduced-sodium tomato sauce and paste. Low-sodium or reduced-sodium canned vegetables.  Fruits All fresh, canned (in natural juice), or frozen fruits. Meat and Other Protein Products Ground beef (85% or leaner), grass-fed beef, or beef trimmed of fat. Skinless chicken or [place]. Ground chicken or [place]. Pork trimmed of fat. All fish and seafood. Eggs. Dried beans, peas, or lentils. Unsalted nuts and seeds. Unsalted canned beans. Dairy Low-fat dairy products, such as skim or 1% milk, 2% or reduced-fat cheeses, low-fat ricotta or cottage cheese, or plain low-fat yogurt. Low-sodium or reduced-sodium cheeses. Fats and Oils Tub margarines without trans fats. Light or reduced-fat mayonnaise and salad dressings (reduced sodium). Avocado. Safflower, olive, or canola oils. Natural peanut or almond butter. Other Unsalted popcorn and pretzels. The items listed above may not be a complete list of recommended foods or beverages. Contact your dietitian for more options. WHAT FOODS ARE NOT RECOMMENDED? Grains White bread. White pasta.  White rice. Refined cornbread. Bagels and croissants. Crackers that contain trans fat. Vegetables Creamed or fried vegetables. Vegetables in a cheese sauce. Regular canned vegetables. Regular canned tomato sauce and paste. Regular tomato and vegetable juices. Fruits Dried fruits. Canned fruit in light or heavy syrup. Fruit juice. Meat and Other Protein Products Fatty cuts of meat. Ribs, chicken wings, bacon, sausage, bologna, salami, chitterlings, fatback, hot dogs, bratwurst, and packaged luncheon meats. Salted nuts and seeds. Canned beans with salt. Dairy Whole or 2% milk, cream, half-and-half, and cream cheese. Whole-fat or sweetened yogurt. Full-fat cheeses or blue cheese. Nondairy creamers and whipped toppings. Processed cheese, cheese spreads, or cheese curds. Condiments Onion and garlic salt, seasoned salt, table salt, and sea salt. Canned and packaged gravies. Worcestershire sauce. Tartar sauce. Barbecue sauce. Teriyaki sauce. Soy sauce, including reduced sodium. Steak sauce. Fish sauce. Oyster sauce. Cocktail sauce. Horseradish. Ketchup and mustard. Meat flavorings and tenderizers. Bouillon cubes. Hot sauce. Tabasco sauce. Marinades. Taco seasonings. Relishes. Fats and Oils Butter, stick margarine, lard, shortening, ghee, and bacon fat. Coconut, palm kernel, or palm oils. Regular salad dressings. Other Pickles and olives. Salted popcorn and pretzels. The items listed above may not be a complete list of foods and beverages to avoid. Contact your dietitian for more information. WHERE CAN I FIND MORE INFORMATION? National Heart, Lung, and Blood Institute: travelstabloid.com   This information is not intended to replace advice given to you by your health care provider. Make sure you discuss any questions you have with your health care provider.   Document Released: 06/26/2011 Document Revised: 07/28/2014 Document Reviewed: 05/11/2013 Elsevier Interactive  Patient Education 2016 [employer].  Hypertension Hypertension, commonly called high blood pressure, is when the force of blood pumping through your arteries is too strong. Your arteries are the blood vessels that carry blood from your heart throughout your body. A blood pressure reading consists of a higher number over a lower number, such as 110/72. The higher number (systolic) is the pressure inside your arteries when your heart pumps. The lower number (diastolic) is the pressure inside your arteries when your heart relaxes. Ideally you want your blood pressure below 120/80. Hypertension forces your heart to work harder to pump blood. Your arteries may become narrow or stiff. Having untreated or uncontrolled hypertension can cause heart attack, stroke, kidney disease, and other problems. RISK FACTORS Some risk factors for high blood pressure are controllable. Others  are not.  Risk factors you cannot control include:   Race. You may be at higher risk if you are African American.  Age. Risk increases with age.  Gender. Men are at higher risk than women before age 41 years. After age 39, women are at higher risk than men. Risk factors you can control include:  Not getting enough exercise or physical activity.  Being overweight.  Getting too much fat, sugar, calories, or salt in your diet.  Drinking too much alcohol. SIGNS AND SYMPTOMS Hypertension does not usually cause signs or symptoms. Extremely high blood pressure (hypertensive crisis) may cause headache, anxiety, shortness of breath, and nosebleed. DIAGNOSIS To check if you have hypertension, your health care provider will measure your blood pressure while you are seated, with your arm held at the level of your heart. It should be measured at least twice using the same arm. Certain conditions can cause a difference in blood pressure between your right and left arms. A blood pressure reading that is higher than normal on one occasion  does not mean that you need treatment. If it is not clear whether you have high blood pressure, you may be asked to return on a different day to have your blood pressure checked again. Or, you may be asked to monitor your blood pressure at home for 1 or more weeks. TREATMENT Treating high blood pressure includes making lifestyle changes and possibly taking medicine. Living a healthy lifestyle can help lower high blood pressure. You may need to change some of your habits. Lifestyle changes may include:  Following the DASH diet. This diet is high in fruits, vegetables, and whole grains. It is low in salt, red meat, and added sugars.  Keep your sodium intake below 2,300 mg per day.  Getting at least 30-45 minutes of aerobic exercise at least 4 times per week.  Losing weight if necessary.  Not smoking.  Limiting alcoholic beverages.  Learning ways to reduce stress. Your health care provider may prescribe medicine if lifestyle changes are not enough to get your blood pressure under control, and if one of the following is true:  You are 51-74 years of age and your systolic blood pressure is above 140.  You are 77 years of age or older, and your systolic blood pressure is above 150.  Your diastolic blood pressure is above 90.  You have diabetes, and your systolic blood pressure is over [id number] or your diastolic blood pressure is over 90.  You have kidney disease and your blood pressure is above 140/90.  You have heart disease and your blood pressure is above 140/90. Your personal target blood pressure may vary depending on your medical conditions, your age, and other factors. HOME CARE INSTRUCTIONS  Have your blood pressure rechecked as directed by your health care provider.   Take medicines only as directed by your health care provider. Follow the directions carefully. Blood pressure medicines must be taken as prescribed. The medicine does not work as well when you skip doses. Skipping  doses also puts you at risk for problems.  Do not smoke.   Monitor your blood pressure at home as directed by your health care provider. SEEK MEDICAL CARE IF:   You think you are having a reaction to medicines taken.  You have recurrent headaches or feel dizzy.  You have swelling in your ankles.  You have trouble with your vision. SEEK IMMEDIATE MEDICAL CARE IF:  You develop a severe headache or confusion.  You have  unusual weakness, numbness, or feel faint.  You have severe chest or abdominal pain.  You vomit repeatedly.  You have trouble breathing. MAKE SURE YOU:   Understand these instructions.  Will watch your condition.  Will get help right away if you are not doing well or get worse.   This information is not intended to replace advice given to you by your health care provider. Make sure you discuss any questions you have with your health care provider.   Document Released: 07/07/2005 Document Revised: 11/21/2014 Document Reviewed: 04/29/2013 Elsevier Interactive Patient Education 2016 [place] Your High Blood Pressure Blood pressure is a measurement of how forceful your blood is pressing against the walls of the arteries. Arteries are muscular tubes within the circulatory system. Blood pressure does not stay the same. Blood pressure rises when you are active, excited, or nervous; and it lowers during sleep and relaxation. If the numbers measuring your blood pressure stay above normal most of the time, you are at risk for health problems. High blood pressure (hypertension) is a long-term (chronic) condition in which blood pressure is elevated. A blood pressure reading is recorded as two numbers, such as 120 over 80 (or 120/80). The first, higher number is called the systolic pressure. It is a measure of the pressure in your arteries as the heart beats. The second, lower number is called the diastolic pressure. It is a measure of the pressure in your  arteries as the heart relaxes between beats.  Keeping your blood pressure in a normal range is important to your overall health and prevention of health problems, such as heart disease and stroke. When your blood pressure is uncontrolled, your heart has to work harder than normal. High blood pressure is a very common condition in adults because blood pressure tends to rise with age. Men and women are equally likely to have hypertension but at different times in life. Before age 11, men are more likely to have hypertension. After 34 years of age, women are more likely to have it. Hypertension is especially common in African Americans. This condition often has no signs or symptoms. The cause of the condition is usually not known. Your caregiver can help you come up with a plan to keep your blood pressure in a normal, healthy range. BLOOD PRESSURE STAGES Blood pressure is classified into four stages: normal, prehypertension, stage 1, and stage 2. Your blood pressure reading will be used to determine what type of treatment, if any, is necessary. Appropriate treatment options are tied to these four stages:  Normal  Systolic pressure (mm Hg): below 120.  Diastolic pressure (mm Hg): below 80. Prehypertension  Systolic pressure (mm Hg): 120 to 139.  Diastolic pressure (mm Hg): 80 to 89. Stage1  Systolic pressure (mm Hg): 140 to 159.  Diastolic pressure (mm Hg): 90 to 99. Stage2  Systolic pressure (mm Hg): 160 or above.  Diastolic pressure (mm Hg): 100 or above. RISKS RELATED TO HIGH BLOOD PRESSURE Managing your blood pressure is an important responsibility. Uncontrolled high blood pressure can lead to:  A heart attack.  A stroke.  A weakened blood vessel (aneurysm).  Heart failure.  Kidney damage.  Eye damage.  Metabolic syndrome.  Memory and concentration problems. HOW TO MANAGE YOUR BLOOD PRESSURE Blood pressure can be managed effectively with lifestyle changes and medicines  (if needed). Your caregiver will help you come up with a plan to bring your blood pressure within a normal range. Your plan should include  the following: Education  Read all information provided by your caregivers about how to control blood pressure.  Educate yourself on the latest guidelines and treatment recommendations. New research is always being done to further define the risks and treatments for high blood pressure. Lifestylechanges  Control your weight.  Avoid smoking.  Stay physically active.  Reduce the amount of salt in your diet.  Reduce stress.  Control any chronic conditions, such as high cholesterol or diabetes.  Reduce your alcohol intake. Medicines  Several medicines (antihypertensive medicines) are available, if needed, to bring blood pressure within a normal range. Communication  Review all the medicines you take with your caregiver because there may be side effects or interactions.  Talk with your caregiver about your diet, exercise habits, and other lifestyle factors that may be contributing to high blood pressure.  See your caregiver regularly. Your caregiver can help you create and adjust your plan for managing high blood pressure. RECOMMENDATIONS FOR TREATMENT AND FOLLOW-UP  The following recommendations are based on current guidelines for managing high blood pressure in nonpregnant adults. Use these recommendations to identify the proper follow-up period or treatment option based on your blood pressure reading. You can discuss these options with your caregiver.  Systolic pressure of 123456 to [id number] or diastolic pressure of 80 to 89: Follow up with your caregiver as directed.  Systolic pressure of [id number] to 0000000 or diastolic pressure of 90 to 100: Follow up with your caregiver within 2 months.  Systolic pressure above 0000000 or diastolic pressure above [id number]: Follow up with your caregiver within 1 month.  Systolic pressure above [id number] or diastolic pressure above [id number]:  Consider antihypertensive therapy; follow up with your caregiver within 1 week.  Systolic pressure above [id number] or diastolic pressure above 123456: Begin antihypertensive therapy; follow up with your caregiver within 1 week.   This information is not intended to replace advice given to you by your health care provider. Make sure you discuss any questions you have with your health care provider.   Document Released: 03/31/2012 Document Reviewed: 03/31/2012 Elsevier Interactive Patient Education 2016 [place].  Potassium Content of Foods Potassium is a mineral found in many foods and drinks. It helps keep fluids and minerals balanced in your body and affects how steadily your heart beats. Potassium also helps control your blood pressure and keep your muscles and nervous system healthy. Certain health conditions and medicines may change the balance of potassium in your body. When this happens, you can help balance your level of potassium through the foods that you do or do not eat. Your health care provider or dietitian may recommend an amount of potassium that you should have each day. The following lists of foods provide the amount of potassium (in parentheses) per serving in each item. HIGH IN POTASSIUM  The following foods and beverages have 200 mg or more of potassium per serving:  Apricots, 2 raw or 5 dry (200 mg).  Artichoke, 1 medium (345 mg).  Avocado, raw,  each (245 mg).  Banana, 1 medium (425 mg).  Beans, lima, or baked beans, canned,  cup (280 mg).  Beans, white, canned,  cup (595 mg).  Beef roast, 3 oz (320 mg).  Beef, ground, 3 oz (270 mg).  Beets, raw or cooked,  cup (260 mg).  Bran muffin, 2 oz (300 mg).  Broccoli,  cup (230 mg).  Brussels sprouts,  cup (250 mg).  Cantaloupe,  cup (215 mg).  Cereal, 100% bran,  cup (200-400 mg).  Cheeseburger, single, fast food, 1 each (225-400 mg).  Chicken, 3 oz (220 mg).  Clams, canned, 3 oz (535 mg).  Crab, 3  oz (225 mg).  Dates, 5 each (270 mg).  Dried beans and peas,  cup (300-475 mg).  Figs, dried, 2 each (260 mg).  Fish: halibut, tuna, cod, snapper, 3 oz (480 mg).  Fish: salmon, haddock, swordfish, perch, 3 oz (300 mg).  Fish, tuna, canned 3 oz (200 mg).  [place] fries, fast food, 3 oz (470 mg).  Granola with fruit and nuts,  cup (200 mg).  Grapefruit juice,  cup (200 mg).  Greens, beet,  cup (655 mg).  Honeydew melon,  cup (200 mg).  Kale, raw, 1 cup (300 mg).  Kiwi, 1 medium (240 mg).  Kohlrabi, rutabaga, parsnips,  cup (280 mg).  Lentils,  cup (365 mg).  Mango, 1 each (325 mg).  Milk, chocolate, 1 cup (420 mg).  Milk: nonfat, low-fat, whole, buttermilk, 1 cup (350-380 mg).  Molasses, 1 Tbsp (295 mg).  Mushrooms,  cup (280) mg.  Nectarine, 1 each (275 mg).  Nuts: almonds, peanuts, hazelnuts, [place], cashew, mixed, 1 oz (200 mg).  Nuts, pistachios, 1 oz (295 mg).  Orange, 1 each (240 mg).  Orange juice,  cup (235 mg).  Papaya, medium,  fruit (390 mg).  Peanut butter, chunky, 2 Tbsp (240 mg).  Peanut butter, smooth, 2 Tbsp (210 mg).  Pear, 1 medium (200 mg).  Pomegranate, 1 whole (400 mg).  Pomegranate juice,  cup (215 mg).  Pork, 3 oz (350 mg).  Potato chips, salted, 1 oz (465 mg).  Potato, baked with skin, 1 medium (925 mg).  Potatoes, boiled,  cup (255 mg).  Potatoes, mashed,  cup (330 mg).  Prune juice,  cup (370 mg).  Prunes, 5 each (305 mg).  Pudding, chocolate,  cup (230 mg).  Pumpkin, canned,  cup (250 mg).  Raisins, seedless,  cup (270 mg).  Seeds, sunflower or pumpkin, 1 oz (240 mg).  Soy milk, 1 cup (300 mg).  Spinach,  cup (420 mg).  Spinach, canned,  cup (370 mg).  Sweet potato, baked with skin, 1 medium (450 mg).  Swiss chard,  cup (480 mg).  Tomato or vegetable juice,  cup (275 mg).  Tomato sauce or puree,  cup (400-550 mg).  Tomato, raw, 1 medium (290 mg).  Tomatoes, canned,  cup  (200-300 mg).  [place], 3 oz (250 mg).  Wheat germ, 1 oz (250 mg).  Winter squash,  cup (250 mg).  Yogurt, plain or fruited, 6 oz (260-435 mg).  Zucchini,  cup (220 mg). MODERATE IN POTASSIUM The following foods and beverages have 50-200 mg of potassium per serving:  Apple, 1 each (150 mg).  Apple juice,  cup (150 mg).  Applesauce,  cup (90 mg).  Apricot nectar,  cup (140 mg).  Asparagus, small spears,  cup or 6 spears (155 mg).  Bagel, cinnamon raisin, 1 each (130 mg).  Bagel, egg or plain, 4 in., 1 each (70 mg).  Beans, green,  cup (90 mg).  Beans, yellow,  cup (190 mg).  Beer, regular, 12 oz (100 mg).  Beets, canned,  cup (125 mg).  Blackberries,  cup (115 mg).  Blueberries,  cup (60 mg).  Bread, whole wheat, 1 slice (70 mg).  Broccoli, raw,  cup (145 mg).  Cabbage,  cup (150 mg).  Carrots, cooked or raw,  cup (180 mg).  Cauliflower, raw,  cup (150 mg).  Celery, raw,  cup (155 mg).  Cereal, bran flakes, cup (120-150 mg).  Cheese, cottage,  cup (110 mg).  Cherries, 10 each (150 mg).  Chocolate, 1 oz bar (165 mg).  Coffee, brewed 6 oz (90 mg).  Corn,  cup or 1 ear (195 mg).  Cucumbers,  cup (80 mg).  Egg, large, 1 each (60 mg).  Eggplant,  cup (60 mg).  Endive, raw, cup (80 mg).  English muffin, 1 each (65 mg).  Fish, orange roughy, 3 oz (150 mg).  Frankfurter, beef or pork, 1 each (75 mg).  Fruit cocktail,  cup (115 mg).  Grape juice,  cup (170 mg).  Grapefruit,  fruit (175 mg).  Grapes,  cup (155 mg).  Greens: kale, turnip, collard,  cup (110-150 mg).  Ice cream or frozen yogurt, chocolate,  cup (175 mg).  Ice cream or frozen yogurt, vanilla,  cup (120-150 mg).  Lemons, limes, 1 each (80 mg).  Lettuce, all types, 1 cup (100 mg).  Mixed vegetables,  cup (150 mg).  Mushrooms, raw,  cup (110 mg).  Nuts: walnuts, pecans, or macadamia, 1 oz (125 mg).  Oatmeal,  cup (80 mg).  Okra,   cup (110 mg).  Onions, raw,  cup (120 mg).  Peach, 1 each (185 mg).  Peaches, canned,  cup (120 mg).  Pears, canned,  cup (120 mg).  Peas, green, frozen,  cup (90 mg).  Peppers, green,  cup (130 mg).  Peppers, red,  cup (160 mg).  Pineapple juice,  cup (165 mg).  Pineapple, fresh or canned,  cup (100 mg).  Plums, 1 each (105 mg).  Pudding, vanilla,  cup (150 mg).  Raspberries,  cup (90 mg).  Rhubarb,  cup (115 mg).  Rice, wild,  cup (80 mg).  Shrimp, 3 oz (155 mg).  Spinach, raw, 1 cup (170 mg).  Strawberries,  cup (125 mg).  Summer squash  cup (175-200 mg).  Swiss chard, raw, 1 cup (135 mg).  Tangerines, 1 each (140 mg).  Tea, brewed, 6 oz (65 mg).  Turnips,  cup (140 mg).  Watermelon,  cup (85 mg).  Wine, red, table, 5 oz (180 mg).  Wine, white, table, 5 oz (100 mg). LOW IN POTASSIUM The following foods and beverages have less than 50 mg of potassium per serving.  Bread, white, 1 slice (30 mg).  Carbonated beverages, 12 oz (less than 5 mg).  Cheese, 1 oz (20-30 mg).  Cranberries,  cup (45 mg).  Cranberry juice cocktail,  cup (20 mg).  Fats and oils, 1 Tbsp (less than 5 mg).  Hummus, 1 Tbsp (32 mg).  Nectar: papaya, mango, or pear,  cup (35 mg).  Rice, white or brown,  cup (50 mg).  Spaghetti or macaroni,  cup cooked (30 mg).  Tortilla, flour or corn, 1 each (50 mg).  Waffle, 4 in., 1 each (50 mg).  Water chestnuts,  cup (40 mg).   This information is not intended to replace advice given to you by your health care provider. Make sure you discuss any questions you have with your health care provider.   Document Released: 02/18/2005 Document Revised: 07/12/2013 Document Reviewed: 06/03/2013 Elsevier Interactive Patient Education [employer].

## 2019-11-22 ENCOUNTER — Emergency Department (HOSPITAL_BASED_OUTPATIENT_CLINIC_OR_DEPARTMENT_OTHER): Payer: Self-pay

## 2019-11-22 ENCOUNTER — Encounter (HOSPITAL_COMMUNITY): Payer: Self-pay | Admitting: Emergency Medicine

## 2019-11-22 ENCOUNTER — Emergency Department (HOSPITAL_COMMUNITY)
Admission: EM | Admit: 2019-11-22 | Discharge: 2019-11-22 | Disposition: A | Discharge status: Home / Self Care | Payer: Self-pay | Attending: Emergency Medicine | Admitting: Emergency Medicine

## 2019-11-22 ENCOUNTER — Emergency Department (HOSPITAL_COMMUNITY): Payer: Self-pay

## 2019-11-22 DIAGNOSIS — R0981 Nasal congestion: Secondary | ICD-10-CM | POA: Insufficient documentation

## 2019-11-22 DIAGNOSIS — M79601 Pain in right arm: Secondary | ICD-10-CM

## 2019-11-22 DIAGNOSIS — Y939 Activity, unspecified: Secondary | ICD-10-CM | POA: Insufficient documentation

## 2019-11-22 DIAGNOSIS — W1830XA Fall on same level, unspecified, initial encounter: Secondary | ICD-10-CM | POA: Insufficient documentation

## 2019-11-22 DIAGNOSIS — R609 Edema, unspecified: Secondary | ICD-10-CM

## 2019-11-22 DIAGNOSIS — M25521 Pain in right elbow: Secondary | ICD-10-CM | POA: Insufficient documentation

## 2019-11-22 DIAGNOSIS — Z20822 Contact with and (suspected) exposure to covid-19: Secondary | ICD-10-CM | POA: Insufficient documentation

## 2019-11-22 DIAGNOSIS — Y929 Unspecified place or not applicable: Secondary | ICD-10-CM | POA: Insufficient documentation

## 2019-11-22 DIAGNOSIS — Y999 Unspecified external cause status: Secondary | ICD-10-CM | POA: Insufficient documentation

## 2019-11-22 LAB — RESPIRATORY PANEL BY RT PCR (FLU A&B, COVID)
Influenza A by PCR: NEGATIVE
Influenza B by PCR: NEGATIVE
SARS Coronavirus 2 by RT PCR: NEGATIVE

## 2019-11-22 MED ORDER — ACETAMINOPHEN 500 MG PO TABS
1000.0000 mg | ORAL_TABLET | Freq: Once | ORAL | Status: AC
  Administered 2019-11-22: 1000 mg via ORAL
  Filled 2019-11-22 (×2): qty 2

## 2019-11-22 MED ORDER — NAPROXEN 500 MG PO TABS: 500.0000 mg | ORAL_TABLET | Freq: Two times a day (BID) | ORAL | 0 refills | Status: AC

## 2019-11-22 NOTE — ED Provider Notes (Signed)
Care transferred from [place], [place] at shift change. See note for full HPI.  In summation 39 year old male presented for evaluation of swelling and pain to his right elbow.  Had a fall few days PTA onto his right elbow.  Numbness or weakness.  Is also around his daughter recently who was diagnosed with Covid.  He has some rhinorrhea however no cough, shortness of breath.  Plain film imaging showed some soft tissue swelling however no underlying fracture or dislocation.  Previous provider thought low suspicion for septic joint.  Will get ultrasound to rule out DVT, Covid test given recent exposure. Physical Exam  BP 136/90 (BP Location: Left Arm)   Pulse 100   Temp 98.8 F (37.1 C) (Oral)   Resp 17   Ht 6\' 3"  (1.905 m)   Wt 102.1 kg   SpO2 99%   BMI 28.12 kg/m   Physical Exam Vitals and nursing note reviewed.  Constitutional:      General: He is not in acute distress.    Appearance: He is well-developed. He is not ill-appearing, toxic-appearing or diaphoretic.  HENT:     Head: Normocephalic and atraumatic.     Nose: Nose normal.     Mouth/Throat:     Mouth: Mucous membranes are moist.     Pharynx: Oropharynx is clear.  Eyes:     Pupils: Pupils are equal, round, and reactive to light.  Cardiovascular:     Rate and Rhythm: Normal rate and regular rhythm.     Pulses: Normal pulses.          Radial pulses are 2+ on the right side and 2+ on the left side.     Heart sounds: Normal heart sounds.  Pulmonary:     Effort: Pulmonary effort is normal. No respiratory distress.     Breath sounds: Normal breath sounds.  Abdominal:     General: Bowel sounds are normal. There is no distension.     Palpations: Abdomen is soft.  Musculoskeletal:        General: Normal range of motion.     Right upper arm: Normal.     Left upper arm: Normal.     Right elbow: Swelling present. No deformity, effusion or lacerations. Normal range of motion. No tenderness.     Left elbow: Normal.     Right  forearm: Normal.     Left forearm: Normal.     Cervical back: Normal range of motion and neck supple.     Comments: Able to flex and extend at bilateral elbows without difficulty.  Soft tissue swelling to right olecranon.  There is moderate ecchymosis extending from mid forearm to mid humerus.  No erythema or warmth.  Skin:    General: Skin is warm and dry.     Capillary Refill: Capillary refill takes less than 2 seconds.     Comments: Ecchymosis to midshaft forearm extending into mid humerus.  No overlying bony tenderness.  No erythema or warmth.  No breaks in skin, no lacerations or contusions.  Neurological:     General: No focal deficit present.     Mental Status: He is alert.     Cranial Nerves: Cranial nerves are intact.     Sensory: Sensation is intact.     Motor: Motor function is intact.     Coordination: Coordination is intact.     Comments: 5/5 strength bilateral upper extremities.  Intact sensation to radial ulnar aspect to right olecranon.    ED Course/Procedures  Procedures Labs Reviewed  RESPIRATORY PANEL BY RT PCR (FLU A&B, COVID)    MDM  Care transferred from previous provider PA-C at shift change. See note for plan.  In summation. Elbow swelling and ecchymosis after mechanical fall. Able to flex and extend without difficulty. No overlying erythema, warmth. Does have ecchymosis to RUE overlying humerus. Low grade temp here in ED with tachycardia. Per previous provider low suspicion for septic joint, sepsis.  X-ray previously reviewed shows no underlying fracture, dislocation however soft tissue swelling.  Plan on ultrasound to rule out DVT as well as Covid test given recent exposure and positive daughter.  DVT ultrasound negative however patient does have small complex fluid collection at right olecranon.  No overlying edema, erythema or warmth.  He has full range of motion.  Low suspicion for septic joint or septic bursitis.  No bony tenderness to radial, ulna  or humerus.  Covid test negative  Patient seen and evaluated by attending, Dr. Rex Kras.  Low suspicion for septic joint or septic bursitis.  Unsure of cause of fever.  We will have him follow-up outpatient with orthopedics closely.  I discussed return precautions.  Low suspicion for bacteremia.  The patient has been appropriately medically screened and/or stabilized in the ED. I have low suspicion for any other emergent medical condition which would require further screening, evaluation or treatment in the ED or require inpatient management.  Patient is hemodynamically stable and in no acute distress.  Patient able to ambulate in department prior to ED.  Evaluation does not show acute pathology that would require ongoing or additional emergent interventions while in the emergency department or further inpatient treatment.  I have discussed the diagnosis with the patient and answered all questions.  Pain is been managed while in the emergency department and patient has no further complaints prior to discharge.  Patient is comfortable with plan discussed in room and is stable for discharge at this time.  I have discussed strict return precautions for returning to the emergency department.  Patient was encouraged to follow-up with PCP/specialist refer to at discharge.          [name] A, PA-C 11/22/19 2153    [name], Darren Overland, MD 11/22/19 2215

## 2019-11-22 NOTE — ED Provider Notes (Addendum)
[facility] EMERGENCY DEPARTMENT Provider Note   CSN: [id number] Arrival date & time: 11/22/19  1719     History Chief Complaint  Patient presents with  . Joint Swelling    [name] is a 39 y.o. male.  HPI   39 year old male presenting for evaluation of swelling and pain to the right elbow.  States that he fell a few days ago onto his right elbow.  Since then he has had some mild pain to the area.  He is able to complete range of motion activities without difficulty.  He reports extensive bruising to the right upper extremity.  States that he has a history of easy bruising.  He denies any numbness or weakness.  States he was recently around his daughter who had Covid.  He has had some rhinorrhea.  He denies any cough, shortness of breath, abdominal pain, nausea, vomiting, diarrhea, urinary symptoms.  Past Medical History:  Diagnosis Date  . Tooth infection     There are no problems to display for this patient.   Past Surgical History:  Procedure Laterality Date  . arm surgery         Family History  Problem Relation Age of Onset  . Heart failure Neg Hx   . Heart attack Neg Hx   . Fainting Neg Hx     Social History   Tobacco Use  . Smoking status: Never Smoker  Substance Use Topics  . Alcohol use: Yes    Comment: every other day  . Drug use: No    Home Medications Prior to Admission medications   Medication Sig Start Date End Date Taking? Authorizing Provider  amoxicillin (AMOXIL) 500 MG capsule Take 1 capsule (500 mg total) by mouth 3 (three) times daily. Patient not taking: Reported on 11/22/2019 03/26/14   Pisciotta, Elmyra Ricks, PA-C  diclofenac (VOLTAREN) 75 MG EC tablet Take 1 tablet (75 mg total) by mouth 2 (two) times daily. Patient not taking: Reported on 11/22/2019 09/22/13   Palumbo, April, MD  naproxen (NAPROSYN) 500 MG tablet Take 1 tablet (500 mg total) by mouth 2 (two) times daily for 7 days. 11/22/19 11/29/19  Maleik Vanderzee S, PA-C    oxyCODONE-acetaminophen (PERCOCET) 5-325 MG per tablet Take 1 tablet by mouth every 6 (six) hours as needed for pain. Patient not taking: Reported on 11/22/2019 02/26/13   Palumbo, April, MD  oxyCODONE-acetaminophen (PERCOCET/ROXICET) 5-325 MG per tablet 1 to 2 tabs PO q6hrs  PRN for pain Patient not taking: Reported on 11/22/2019 03/26/14   Pisciotta, Elmyra Ricks, PA-C    Allergies    Patient has no known allergies.  Review of Systems   Review of Systems  Constitutional: Negative for fever.  HENT: Positive for rhinorrhea.   Respiratory: Negative for cough and shortness of breath.   Cardiovascular: Negative for chest pain.  Gastrointestinal: Negative for abdominal pain, constipation, diarrhea, nausea and vomiting.  Musculoskeletal: Negative for myalgias.       Right elbow pain  Skin: Negative for wound.  Neurological: Negative for headaches.    Physical Exam Updated Vital Signs BP 136/90 (BP Location: Left Arm)   Pulse 100   Temp 98.8 F (37.1 C) (Oral)   Resp 17   Ht 6\' 3"  (1.905 m)   Wt 102.1 kg   SpO2 99%   BMI 28.12 kg/m   Physical Exam Vitals and nursing note reviewed.  Constitutional:      Appearance: He is well-developed.  HENT:     Head: Normocephalic  and atraumatic.  Eyes:     Conjunctiva/sclera: Conjunctivae normal.  Cardiovascular:     Rate and Rhythm: Tachycardia present.  Pulmonary:     Effort: Pulmonary effort is normal.  Musculoskeletal:     Cervical back: Neck supple.     Comments: Mild diffuse TTP over the right elbow with swelling noted to the olecranon bursae. The RUE is warm to touch, with diffuse ecchymosis and erythema.  Distal pulses are intact. Sensation is intact. ROM is intact.   Skin:    General: Skin is warm and dry.  Neurological:     Mental Status: He is alert.     ED Results / Procedures / Treatments   Labs (all labs ordered are listed, but only abnormal results are displayed) Labs Reviewed  RESPIRATORY PANEL BY RT PCR (FLU A&B, COVID)     EKG None  Radiology DG Elbow 2 Views Right  Result Date: 11/22/2019 CLINICAL DATA:  Posterior right elbow pain and swelling after fall 3 days ago EXAM: RIGHT ELBOW - 2 VIEW COMPARISON:  11/21/2006 FINDINGS: Frontal and lateral views of the right elbow are obtained. No acute displaced fracture. Alignment is anatomic. Joint spaces are well preserved. No evidence of joint effusion. Prominent dorsal soft tissue edema overlying the olecranon. IMPRESSION: 1. Dorsal soft tissue swelling.  No acute fracture. Electronically Signed   By: Randa Ngo M.D.   On: 11/22/2019 17:54   UE VENOUS DUPLEX ([facility] & [facility] 7 am - 7 pm)  Result Date: 11/22/2019 UPPER VENOUS STUDY  Performing Technologist: Antonieta Pert RDMS, RVT  Examination Guidelines: A complete evaluation includes B-mode imaging, spectral Doppler, color Doppler, and power Doppler as needed of all accessible portions of each vessel. Bilateral testing is considered an integral part of a complete examination. Limited examinations for reoccurring indications may be performed as noted.  Right Findings: +----------+------------+---------+-----------+----------+---------------------+ RIGHT     CompressiblePhasicitySpontaneousProperties       Summary        +----------+------------+---------+-----------+----------+---------------------+ IJV                      Yes       Yes                hard to image, pt                                                        upright in a chair   +----------+------------+---------+-----------+----------+---------------------+ Subclavian    Full       Yes       Yes                                    +----------+------------+---------+-----------+----------+---------------------+ Axillary      Full       Yes       Yes                                    +----------+------------+---------+-----------+----------+---------------------+ Brachial      Full       Yes       Yes                                     +----------+------------+---------+-----------+----------+---------------------+  Radial        Full                                                        +----------+------------+---------+-----------+----------+---------------------+ Ulnar         Full                                                        +----------+------------+---------+-----------+----------+---------------------+ Cephalic      Full       Yes       Yes                                    +----------+------------+---------+-----------+----------+---------------------+ Basilic       Full       Yes       Yes                                    +----------+------------+---------+-----------+----------+---------------------+  Left Findings: +----------+------------+---------+-----------+----------+-------+ LEFT      CompressiblePhasicitySpontaneousPropertiesSummary +----------+------------+---------+-----------+----------+-------+ Subclavian    Full       Yes       Yes                      +----------+------------+---------+-----------+----------+-------+  Summary:  Right: No evidence of deep vein thrombosis in the upper extremity. No evidence of superficial vein thrombosis in the upper extremity. Complex fluid collection noted posterior right elbow.  Left: No evidence of thrombosis in the subclavian.  *See table(s) above for measurements and observations.    Preliminary     Procedures Procedures (including critical care time)  Medications Ordered in ED Medications  acetaminophen (TYLENOL) tablet 1,000 mg (1,000 mg Oral Given 11/22/19 1834)    ED Course  I have reviewed the triage vital signs and the nursing notes.  Pertinent labs & imaging results that were available during my care of the patient were reviewed by me and considered in my medical decision making (see chart for details).    MDM Rules/Calculators/A&P                      39 y/o male with right elbow pain after  fall. Also with swelling and ecchymosis to the RUE. Pt with borderline fever and tachycardia on initial eval.    On exam, there is mild diffuse TTP over the right elbow with swelling noted to the olecranon bursae. The RUE is warm to touch diffusely. There is diffuse ecchymosis to the RUE. Distal pulses are intact. Sensation is intact. ROM is intact.    There is no evidence of septic arthritis on exam.   Xray right elbow with no evidence of fracture, dorsal soft tissue swelling noted.   At shift change RUE ultrasound and COVID test are pending. Care transitioned to [place], PA-C with plan to f/u on studies. I suspect that his fever and tachycardia could be related to viral syndrome such as COVID given his recent exposure.   Final Clinical Impression(s) /  ED Diagnoses Final diagnoses:  Right arm pain    Rx / DC Orders ED Discharge Orders         Ordered    naproxen (NAPROSYN) 500 MG tablet  2 times daily     11/22/19 1840           Jewelle Whitner, [place], PA-C 11/22/19 1840    Rodney Booze, PA-C 11/23/19 1515    Tegeler, Gwenyth Allegra, MD 11/23/19 1626

## 2019-11-22 NOTE — ED Notes (Signed)
Patient verbalizes understanding of discharge instructions. Opportunity for questioning and answers were provided. Armband removed by staff, pt discharged from ED ambulatory.   

## 2019-11-22 NOTE — Progress Notes (Signed)
Right upper extremity venous duplex complete.  Please see CV Proc tab for preliminary results. Lita Mains- RDMS, RVT 8:02 PM  11/22/2019

## 2019-11-22 NOTE — ED Triage Notes (Signed)
Pt arrives to ED after a fall 3 days ago with large amount of swelling to right elbow with bruising. Pt states the pain isnt that bad just concerned due to swelling. +2 radial pulses sensation and movement intact.

## 2019-11-22 NOTE — ED Notes (Signed)
Pt transported to Xray. 

## 2019-11-22 NOTE — Discharge Instructions (Addendum)

## 2020-05-27 ENCOUNTER — Emergency Department (HOSPITAL_COMMUNITY)
Admission: EM | Admit: 2020-05-27 | Discharge: 2020-05-27 | Disposition: A | Discharge status: Home / Self Care | Payer: BC Managed Care – PPO | Attending: Emergency Medicine | Admitting: Emergency Medicine

## 2020-05-27 ENCOUNTER — Other Ambulatory Visit: Payer: Self-pay

## 2020-05-27 ENCOUNTER — Encounter (HOSPITAL_COMMUNITY): Payer: Self-pay

## 2020-05-27 DIAGNOSIS — R040 Epistaxis: Secondary | ICD-10-CM

## 2020-05-27 DIAGNOSIS — R7401 Elevation of levels of liver transaminase levels: Secondary | ICD-10-CM | POA: Diagnosis not present

## 2020-05-27 DIAGNOSIS — F10220 Alcohol dependence with intoxication, uncomplicated: Secondary | ICD-10-CM

## 2020-05-27 HISTORY — DX: Epistaxis: R04.0

## 2020-05-27 LAB — CBC
HCT: 33.1 % — ABNORMAL LOW (ref 39.0–52.0)
Hemoglobin: 11.1 g/dL — ABNORMAL LOW (ref 13.0–17.0)
MCH: 36.9 pg — ABNORMAL HIGH (ref 26.0–34.0)
MCHC: 33.5 g/dL (ref 30.0–36.0)
MCV: 110 fL — ABNORMAL HIGH (ref 80.0–100.0)
Platelets: 174 10*3/uL (ref 150–400)
RBC: 3.01 MIL/uL — ABNORMAL LOW (ref 4.22–5.81)
RDW: 12.1 % (ref 11.5–15.5)
WBC: 4.6 10*3/uL (ref 4.0–10.5)
nRBC: 0 % (ref 0.0–0.2)

## 2020-05-27 LAB — COMPREHENSIVE METABOLIC PANEL
ALT: 40 U/L (ref 0–44)
AST: 133 U/L — ABNORMAL HIGH (ref 15–41)
Albumin: 4.2 g/dL (ref 3.5–5.0)
Alkaline Phosphatase: 112 U/L (ref 38–126)
Anion gap: 14 (ref 5–15)
BUN: 17 mg/dL (ref 6–20)
CO2: 23 mmol/L (ref 22–32)
Calcium: 9.9 mg/dL (ref 8.9–10.3)
Chloride: 104 mmol/L (ref 98–111)
Creatinine, Ser: 0.73 mg/dL (ref 0.61–1.24)
GFR, Estimated: >60 mL/min (ref >60)
Glucose, Bld: 88 mg/dL (ref 70–99)
Potassium: 4.1 mmol/L (ref 3.5–5.1)
Sodium: 141 mmol/L (ref 135–145)
Total Bilirubin: 1.3 mg/dL — ABNORMAL HIGH (ref 0.3–1.2)
Total Protein: 8.2 g/dL — ABNORMAL HIGH (ref 6.5–8.1)

## 2020-05-27 MED ORDER — OXYMETAZOLINE HCL 0.05 % NA SOLN
1.0000 | Freq: Once | NASAL | Status: AC
  Administered 2020-05-27: 1 via NASAL
  Filled 2020-05-27: qty 30

## 2020-05-27 NOTE — ED Triage Notes (Signed)
Pt brought in by [facility] for epistaxis. Pt was sitting in room and nose starting bleeding. No injury noted. Pt has been drinking ETOH today but is alert and oriented, answers questions appropriately. Pt states he has a history of nose bleeds.

## 2020-05-27 NOTE — ED Provider Notes (Signed)
[name]   CSN: [id number] Arrival date & time: 05/27/20  0019     History Chief Complaint  Patient presents with  . Epistaxis    [name] is a 39 y.o. male with a history of alcohol use disorder who presents the emergency department by EMS with a chief complaint of epistaxis.  The patient reports that he had sudden onset, right-sided epistaxis that began earlier tonight.  He denies any associated trauma or injury.  He has a history of similar nosebleeds, but reports that he has had not had any nosebleeds recently.  He also denies any other spontaneous bleeding, including bleeding from his gums, hematemesis, hemoptysis, melena, hematochezia, or rashes.  He has been drinking alcohol today.  States that he "drank two fifths of liquor, but did not finish the second one."  He does endorse daily alcohol use.  His chart indicates that he drinks 1/5 of liquor daily.  He is a non-smoker.  He denies illicit or recreational drug use.  He denies abdominal pain, nausea, vomiting, diarrhea, fever, chills.  No other symptoms at this time.  Patient states that he lives alone.  He has not tried any interventions for his epistaxis prior to arrival.  The history is provided by the patient and medical records. No language interpreter was used.       Past Medical History:  Diagnosis Date  . Epistaxis   . Tooth infection     There are no problems to display for this patient.   Past Surgical History:  Procedure Laterality Date  . arm surgery         Family History  Problem Relation Age of Onset  . Heart failure Neg Hx   . Heart attack Neg Hx   . Fainting Neg Hx     Social History   Tobacco Use  . Smoking status: Never Smoker  . Smokeless tobacco: Never Used  Substance Use Topics  . Alcohol use: Yes    Comment: 5th liquor daily  . Drug use: No    Home Medications Prior to Admission medications   Not on File     Allergies    Patient has no known allergies.  Review of Systems   Review of Systems  Constitutional: Negative for appetite change, diaphoresis, fatigue and fever.  HENT: Positive for nosebleeds. Negative for sinus pressure, sinus pain, sneezing, sore throat and voice change.   Respiratory: Negative for cough, shortness of breath and wheezing.   Cardiovascular: Negative for chest pain and palpitations.  Gastrointestinal: Negative for abdominal pain, blood in stool, diarrhea, nausea and vomiting.  Genitourinary: Negative for dysuria.  Musculoskeletal: Negative for back pain, myalgias, neck pain and neck stiffness.  Skin: Negative for rash.  Allergic/Immunologic: Negative for immunocompromised state.  Neurological: Negative for seizures, syncope, weakness, numbness and headaches.  Psychiatric/Behavioral: Negative for confusion.    Physical Exam Updated Vital Signs BP (!) 158/90 (BP Location: Left Arm)   Pulse (!) 102   Temp 98.7 F (37.1 C) (Oral)   Resp 16   Ht 6\' 3"  (1.905 m)   Wt 100.7 kg   SpO2 98%   BMI 27.75 kg/m   Physical Exam Vitals and nursing [name] reviewed.  Constitutional:      Appearance: He is well-developed.     Comments: Strong odor of EtOH.  HENT:     Head: Normocephalic.     Nose:     Comments: Right-sided epistaxis.  No active oozing.  He has dried blood noted to the philtrum, to his beard, and to the chin with some clots.  Septum is normal.  Left nares is unremarkable.  There is some dried blood noted to the gingiva in the mouth.  No active bleeding from the gingiva.  No blood noted in the posterior oropharynx.    Mouth/Throat:     Mouth: Mucous membranes are moist.  Eyes:     Conjunctiva/sclera: Conjunctivae normal.  Cardiovascular:     Rate and Rhythm: Normal rate and regular rhythm.     Heart sounds: No murmur heard.   Pulmonary:     Effort: Pulmonary effort is normal. No respiratory distress.     Breath sounds: Normal breath sounds. No  stridor. No wheezing, rhonchi or rales.  Chest:     Chest wall: No tenderness.  Abdominal:     General: There is no distension.     Palpations: Abdomen is soft. There is no mass.     Tenderness: There is no abdominal tenderness. There is no right CVA tenderness, left CVA tenderness, guarding or rebound.     Hernia: No hernia is present.     Comments: Abdomen is soft, nontender.  Musculoskeletal:     Cervical back: Neck supple.  Skin:    General: Skin is warm and dry.  Neurological:     Mental Status: He is alert.     Comments: Speech is slurred.  Psychiatric:        Behavior: Behavior normal.     ED Results / Procedures / Treatments   Labs (all labs ordered are listed, but only abnormal results are displayed) Labs Reviewed  CBC - Abnormal; Notable for the following components:      Result Value   RBC 3.01 (*)    Hemoglobin 11.1 (*)    HCT 33.1 (*)    MCV 110.0 (*)    MCH 36.9 (*)    All other components within normal limits  COMPREHENSIVE METABOLIC PANEL - Abnormal; Notable for the following components:   Total Protein 8.2 (*)    AST 133 (*)    Total Bilirubin 1.3 (*)    All other components within normal limits    EKG None  Radiology No results found.  Procedures Procedures (including critical care time)  Medications Ordered in ED Medications  oxymetazoline (AFRIN) 0.05 % nasal spray 1 spray (1 spray Each Nare Given 05/27/20 0100)    ED Course  I have reviewed the triage vital signs and the nursing notes.  Pertinent labs & imaging results that were available during my care of the patient were reviewed by me and considered in my medical decision making (see chart for details).    MDM Rules/Calculators/A&P                          39 year old male with a history of alcohol use disorder who presents by EMS with right-sided epistaxis that began just prior to arrival.  He denies any trauma or injury.  He does endorse daily alcohol use and drinks 1-2 fifths of  liquor daily.   On exam, he is visibly intoxicated.  Right-sided epistaxis has resolved.  There is dried blood on the gingiva of the mouth, but no active bleeding.  He is denying any other bleeding, including hematemesis, melena, hematochezia, or hemoptysis.  He is mildly hypertensive, but vitals are otherwise reassuring.  Afrin was given and the patient applied pressure to the naris for  more than 10 minutes with no recurrence of bleeding.  However, given that he has several large amounts of dried clots noted in his beard on exam, will check hemoglobin level, especially since patient does report that he was having multiple nosebleeds several months ago and he endorses heavy, daily alcohol use.  Labs have been reviewed and independently interpreted by me.  Hemoglobin is 11.1.  No previous available for comparison.  Platelet levels are normal.  His AST is 133, ALT 40.  Total bilirubin and total protein are both minimally elevated.  On exam, he has no right upper quadrant tenderness palpation.  Have a very low suspicion for cholecystitis, pancreatitis, or cholangitis.  The patient plans to call a cab to get home.  However because he is acutely intoxicated, I have discussed with the patient that he needs to be clinically sober prior to discharge from the ER.  I have also discussed peers support with the patient, and he is agreeable to this at this time.  He is uncertain if he is interested in alcohol cessation, but I will provide him with outpatient resources.  I also discussed that given the length of time and quantity of alcohol that he consumes that he should not attempt to stop drinking on his own and would require supervision and most likely an inpatient program.  At this time, I do not see any evidence of fulminant hepatic failure.  There is no evidence of alcohol withdrawal, DTs.  He has now been observed for more than 5 hours and has had no seizure-like activity.  On reevaluation, patient is ambulating  with a steady gait.  No ataxia.  He is not slurring his speech.  He is able to tolerate fluids without difficulty.  At this time, the patient is clinically sober and appropriate for discharge.  ER return precautions given.  He is hemodynamically stable and in no acute distress.  Safe for discharge to home with outpatient follow-up as indicated.  Final Clinical Impression(s) / ED Diagnoses Final diagnoses:  Right-sided epistaxis  Elevated transaminase level  Alcohol dependence with uncomplicated intoxication [facility])    Rx / DC Orders ED Discharge Orders    None       Lauren Aguayo A, PA-C 05/27/20 0447    Rolland Porter, MD 05/27/20 [phone]

## 2020-05-27 NOTE — Discharge Instructions (Signed)
Thank you for allowing me to care for you today in the Emergency Department.   You were seen today for a nosebleed.  This resolved in the ER by applying pressure to the nose.  If you have a recurrent nosebleed at home, leaned forward, and pinch the nostrils for at least 10 minutes.  It is very important you do not lay back as this can cause blood to drip down the back of the throat, which can cause of vomiting.  If bleeding does not stop with this technique, you can also squirt 1 spray of Afrin into the nostril and then pinch the nose and lean forward for at least 10 minutes.  This will stop most nosebleeds.  However, if you continue to have bleeding after attempting these techniques, you may need to return to the emergency department.  I am concerned regarding the amount of alcohol that you have been drinking.  I have provided multiple resources for alcohol cessation on your discharge paperwork as well as a peer support consult.  You may receive a call from one of our peers support counselors.  If you have been drinking lots of alcohol daily for a long period of time, you should never try to stop drinking on your own.  However, it is important to remember that drinking heavy amounts of alcohol over the years can cause damage to your liver.  It can cause problems with bleeding.  I would highly recommend getting established with a primary care for follow-up regarding your labs as your labs suggest there is a damage to your liver, most likely from alcohol use.  Return to the emergency department if you develop severe abdominal pain with confusion, black or bloody stool, if you are vomiting blood, if you have an uncontrollable nosebleed, bleeding from your gums, or other new, concerning symptoms.

## 2021-08-11 IMAGING — CR DG ELBOW 2V*R*
2 series · 2 of 2 positions shown · non-contrast
Comparison: 11/21/2006

CLINICAL DATA: Posterior right elbow pain and swelling after fall 3
days ago

EXAM:
RIGHT ELBOW - 2 VIEW

[elbow ap]
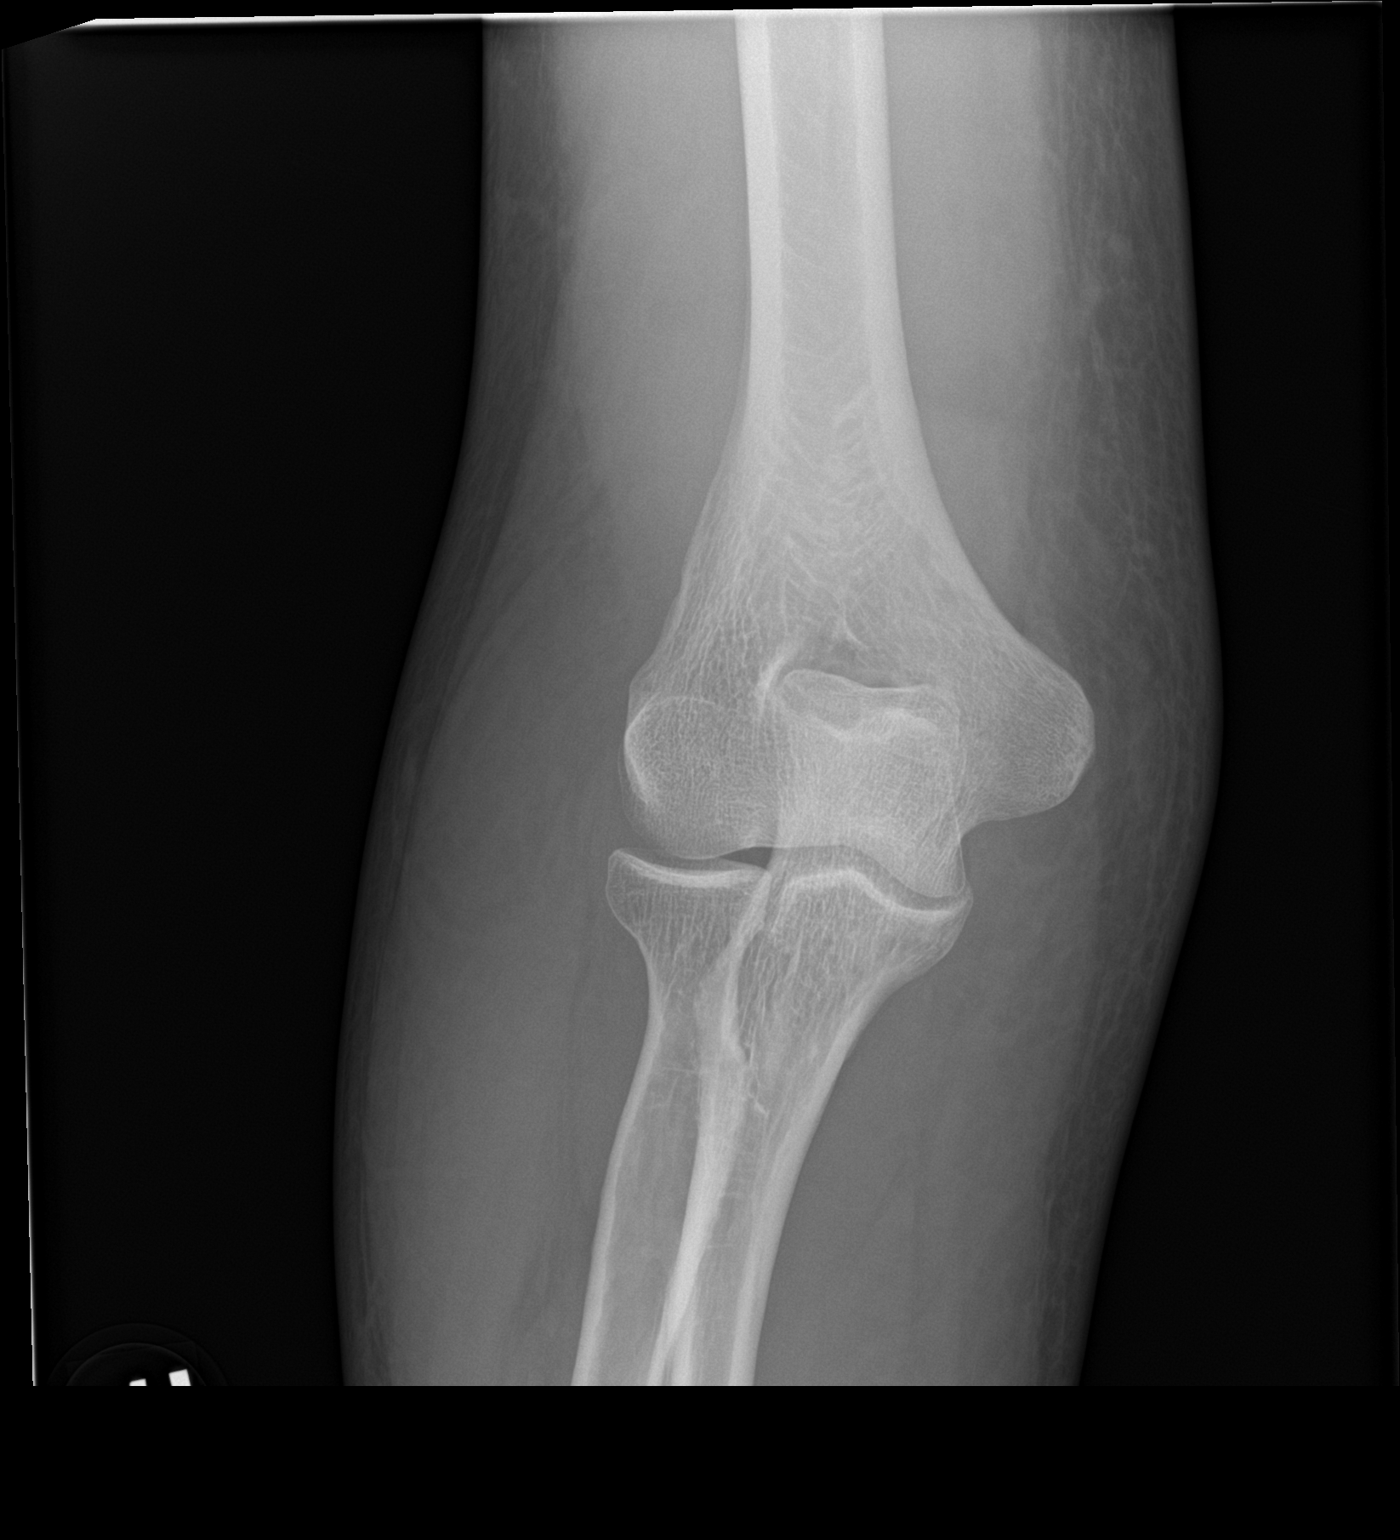

[elbow lat]
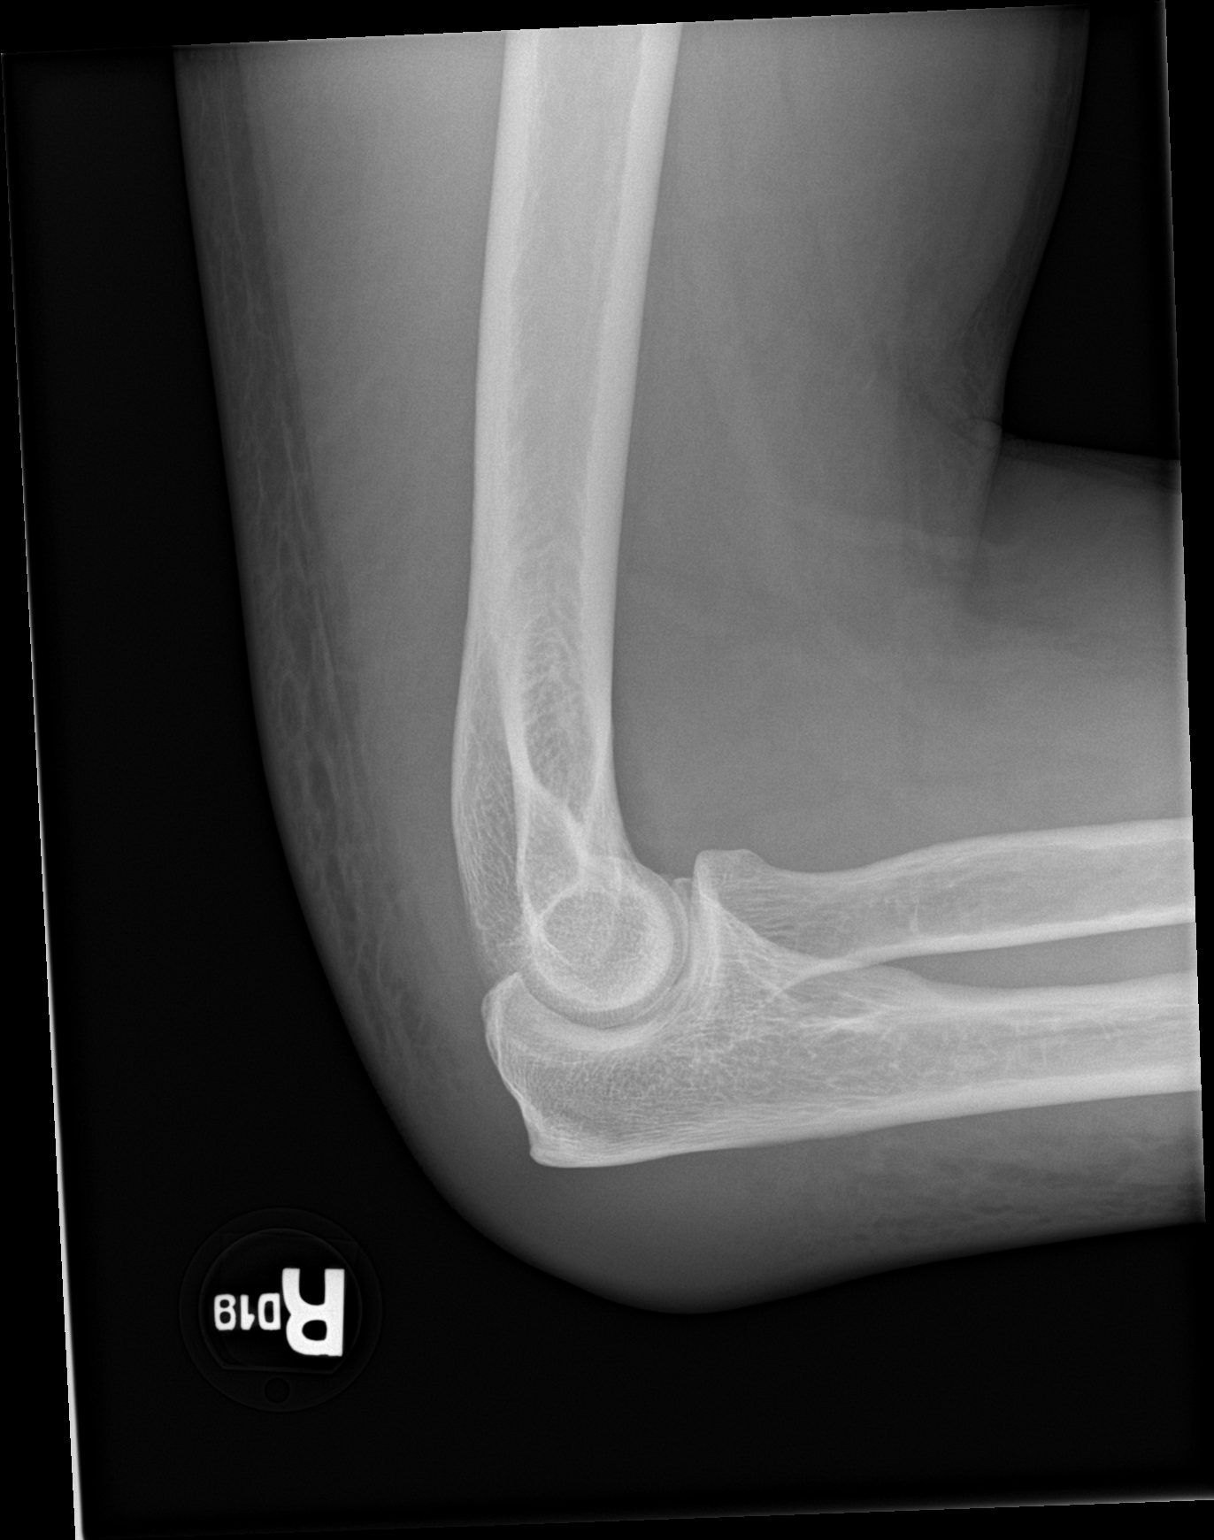

[2 of 2 positions shown; findings below may reference images not displayed]

FINDINGS: Frontal and lateral views of the right elbow are obtained. No acute
displaced fracture. Alignment is anatomic. Joint spaces are well
preserved. No evidence of joint effusion. Prominent dorsal soft
tissue edema overlying the olecranon.
IMPRESSION: 1. Dorsal soft tissue swelling.  No acute fracture.

## 2022-02-03 ENCOUNTER — Emergency Department (HOSPITAL_COMMUNITY): Payer: BC Managed Care – PPO

## 2022-02-03 ENCOUNTER — Emergency Department (HOSPITAL_COMMUNITY)
Admission: EM | Admit: 2022-02-03 | Discharge: 2022-02-03 | Disposition: A | Discharge status: Home / Self Care | Payer: BC Managed Care – PPO | Attending: Emergency Medicine | Admitting: Emergency Medicine

## 2022-02-03 ENCOUNTER — Emergency Department (HOSPITAL_BASED_OUTPATIENT_CLINIC_OR_DEPARTMENT_OTHER): Payer: BC Managed Care – PPO

## 2022-02-03 ENCOUNTER — Other Ambulatory Visit: Payer: Self-pay

## 2022-02-03 ENCOUNTER — Encounter (HOSPITAL_COMMUNITY): Payer: Self-pay | Admitting: Emergency Medicine

## 2022-02-03 DIAGNOSIS — R2241 Localized swelling, mass and lump, right lower limb: Secondary | ICD-10-CM | POA: Diagnosis not present

## 2022-02-03 DIAGNOSIS — E876 Hypokalemia: Secondary | ICD-10-CM | POA: Insufficient documentation

## 2022-02-03 DIAGNOSIS — M7989 Other specified soft tissue disorders: Secondary | ICD-10-CM | POA: Diagnosis present

## 2022-02-03 DIAGNOSIS — I82431 Acute embolism and thrombosis of right popliteal vein: Secondary | ICD-10-CM

## 2022-02-03 DIAGNOSIS — Z7901 Long term (current) use of anticoagulants: Secondary | ICD-10-CM | POA: Insufficient documentation

## 2022-02-03 LAB — CBC WITH DIFFERENTIAL/PLATELET
Abs Immature Granulocytes: 0.03 10*3/uL (ref 0.00–0.07)
Basophils Absolute: 0 10*3/uL (ref 0.0–0.1)
Basophils Relative: 1 %
Eosinophils Absolute: 0.2 10*3/uL (ref 0.0–0.5)
Eosinophils Relative: 3 %
HCT: 34.7 % — ABNORMAL LOW (ref 39.0–52.0)
Hemoglobin: 11.6 g/dL — ABNORMAL LOW (ref 13.0–17.0)
Immature Granulocytes: 1 %
Lymphocytes Relative: 17 %
Lymphs Abs: 1 10*3/uL (ref 0.7–4.0)
MCH: 36.3 pg — ABNORMAL HIGH (ref 26.0–34.0)
MCHC: 33.4 g/dL (ref 30.0–36.0)
MCV: 108.4 fL — ABNORMAL HIGH (ref 80.0–100.0)
Monocytes Absolute: 0.6 10*3/uL (ref 0.1–1.0)
Monocytes Relative: 10 %
Neutro Abs: 4 10*3/uL (ref 1.7–7.7)
Neutrophils Relative %: 68 %
Platelets: 316 10*3/uL (ref 150–400)
RBC: 3.2 MIL/uL — ABNORMAL LOW (ref 4.22–5.81)
RDW: 12.5 % (ref 11.5–15.5)
WBC: 5.8 10*3/uL (ref 4.0–10.5)
nRBC: 0 % (ref 0.0–0.2)

## 2022-02-03 LAB — COMPREHENSIVE METABOLIC PANEL
ALT: 16 U/L (ref 0–44)
AST: 33 U/L (ref 15–41)
Albumin: 3.2 g/dL — ABNORMAL LOW (ref 3.5–5.0)
Alkaline Phosphatase: 125 U/L (ref 38–126)
Anion gap: 9 (ref 5–15)
BUN: <5 mg/dL — ABNORMAL LOW (ref 6–20)
CO2: 28 mmol/L (ref 22–32)
Calcium: 10.7 mg/dL — ABNORMAL HIGH (ref 8.9–10.3)
Chloride: 101 mmol/L (ref 98–111)
Creatinine, Ser: 0.73 mg/dL (ref 0.61–1.24)
GFR, Estimated: >60 mL/min (ref >60)
Glucose, Bld: 104 mg/dL — ABNORMAL HIGH (ref 70–99)
Potassium: 3.2 mmol/L — ABNORMAL LOW (ref 3.5–5.1)
Sodium: 138 mmol/L (ref 135–145)
Total Bilirubin: 2 mg/dL — ABNORMAL HIGH (ref 0.3–1.2)
Total Protein: 8.1 g/dL (ref 6.5–8.1)

## 2022-02-03 MED ORDER — LACTATED RINGERS IV BOLUS
1000.0000 mL | Freq: Once | INTRAVENOUS | Status: AC
  Administered 2022-02-03: 1000 mL via INTRAVENOUS

## 2022-02-03 MED ORDER — APIXABAN 5 MG PO TABS
10.0000 mg | ORAL_TABLET | Freq: Once | ORAL | Status: AC
  Administered 2022-02-03: 10 mg via ORAL
  Filled 2022-02-03: qty 2

## 2022-02-03 MED ORDER — APIXABAN (ELIQUIS) VTE STARTER PACK (10MG AND 5MG): ORAL_TABLET | ORAL | 0 refills | Status: DC

## 2022-02-03 MED ORDER — GADOBUTROL 1 MMOL/ML IV SOLN
10.0000 mL | Freq: Once | INTRAVENOUS | Status: AC | PRN
  Administered 2022-02-03: 10 mL via INTRAVENOUS

## 2022-02-03 NOTE — Progress Notes (Signed)
Right lower extremity venous duplex completed. Refer to "CV Proc" under chart review to view preliminary results.  Preliminary results discussed with Dr. Tinnie Gens.  02/03/2022 9:16 AM Kelby Aline., MHA, RVT, RDCS, RDMS

## 2022-02-03 NOTE — ED Provider Notes (Signed)
[facility] EMERGENCY DEPARTMENT Provider Note   CSN: [id number] Arrival date & time: 02/03/22  0754     History  Chief Complaint  Patient presents with   Leg Swelling    [name] is a 41 y.o. male.  HPI 41 year old male presents with right foot/leg swelling. He has had right calf swelling for 3+ months. Over the past 2 weeks he's had progressive right foot swelling and it's hard to fit in his shoe.  He has no trouble walking.  No weakness/numbness. No pain besides some tightness. No chest pain/dyspnea.  Home Medications Prior to Admission medications   Medication Sig Start Date End Date Taking? Authorizing Provider  APIXABAN [name]) VTE STARTER PACK ('10MG'$  AND '5MG'$ ) Take as directed on package: start with two-'5mg'$  tablets twice daily for 7 days. On day 8, switch to one-'5mg'$  tablet twice daily. 02/03/22  Yes Sherwood Gambler, MD      Allergies    Patient has no known allergies.    Review of Systems   Review of Systems  Respiratory:  Negative for shortness of breath.   Cardiovascular:  Positive for leg swelling. Negative for chest pain.  Neurological:  Negative for numbness.    Physical Exam Updated Vital Signs BP (!) 155/107   Pulse 72   Temp 97.8 F (36.6 C) (Oral)   Resp 19   SpO2 100%  Physical Exam Vitals and nursing note reviewed.  Constitutional:      Appearance: He is well-developed.  HENT:     Head: Normocephalic and atraumatic.  Cardiovascular:     Rate and Rhythm: Normal rate and regular rhythm.     Pulses:          Dorsalis pedis pulses are 2+ on the right side and 2+ on the left side.  Pulmonary:     Effort: Pulmonary effort is normal.     Breath sounds: Normal breath sounds.  Abdominal:     General: There is no distension.  Musculoskeletal:     Right lower leg: Swelling present. No tenderness.     Comments: Firm swelling to right medial calf. No obvious cellulitis. No significant tenderness. See picture  Skin:    General:  Skin is warm and dry.  Neurological:     Mental Status: He is alert.      ED Results / Procedures / Treatments   Labs (all labs ordered are listed, but only abnormal results are displayed) Labs Reviewed  COMPREHENSIVE METABOLIC PANEL - Abnormal; Notable for the following components:      Result Value   Potassium 3.2 (*)    Glucose, Bld 104 (*)    BUN <5 (*)    Calcium 10.7 (*)    Albumin 3.2 (*)    Total Bilirubin 2.0 (*)    All other components within normal limits  CBC WITH DIFFERENTIAL/PLATELET - Abnormal; Notable for the following components:   RBC 3.20 (*)    Hemoglobin 11.6 (*)    HCT 34.7 (*)    MCV 108.4 (*)    MCH 36.3 (*)    All other components within normal limits    EKG EKG Interpretation  Date/Time:  Monday February 03 2022 10:18:34 EDT Ventricular Rate:  76 PR Interval:  158 QRS Duration: 90 QT Interval:  369 QTC Calculation: 415 R Axis:   88 Text Interpretation: Sinus rhythm no acute ST/T changes T wave changes improved from 2016 Confirmed by Sherwood Gambler [phone]) on 02/03/2022 10:30:12 AM  Radiology  MR TIBIA FIBULA RIGHT W WO CONTRAST  Result Date: 02/03/2022 CLINICAL DATA:  Right leg swelling over the last 3 months EXAM: MRI OF LOWER RIGHT EXTREMITY WITHOUT AND WITH CONTRAST TECHNIQUE: Multiplanar, multisequence MR imaging of the right calf was performed both before and after administration of intravenous contrast. CONTRAST:  62m GADAVIST GADOBUTROL 1 MMOL/ML IV SOLN COMPARISON:  Radiograph 02/03/2022 FINDINGS: Bones/Joint/Cartilage Equivocal scalloping of the periosteal border of the tibia posteriorly by the dominant left posterior compartment mass. Degenerative findings at the distal tibiofibular joint. Ligaments N/A Muscles and Tendons In the posterior compartment, and most closely associated with the soleus muscle, we demonstrate a 12.7 by 8.3 by 21.2 cm (volume = 1200 cm^3) lobular enhancing mass with substantial surrounding muscular edema. The mass is  multilobulated with mostly intermediate T1 and T2 signal characteristics. There is probably some flexor digitorum longus involvement or at least impingement for example on image 39 series 7. Soft tissues Potential thrombus in the popliteal vein, posterior tibial veins, and peroneal vein. Diffuse calf edema. IMPRESSION: 1. Approximally 1200 cc mass in the posterior compartment of the calf primarily in the soleus muscle, with lobular enhancement and surrounding muscle edema. Suspected deep vein thrombosis of the popliteal vein with calf vein thrombosis in the posterior tibial vein and peroneal vein in the vicinity of the mass. Some of the elements (lobular shape, vascular involvement, vascular calcifications) are reminiscent of a large hemangioma or similar vascular lesion, but the lesion is less T2 hyperintense than I would expect for hemangioma. Malignancy is not confidently excluded and orthopedic tumor specialist referral is recommended for tissue diagnosis. 2. High suspicion for deep vein thrombosis of the popliteal vein and involving calf veins such as the peroneal vein and posterior tibial vein extending down towards the mass. Consider sonographic confirmation, and to assess overall extent. Electronically Signed   By: WVan ClinesM.D.   On: 02/03/2022 14:36   DG Tibia/Fibula Right  Result Date: 02/03/2022 CLINICAL DATA:  Intermittent lower leg swelling for 6 months. No known injury. EXAM: RIGHT TIBIA AND FIBULA - 2 VIEW COMPARISON:  None Available. FINDINGS: The bones appear adequately mineralized. No evidence of acute fracture or dislocation. The joint spaces appear preserved at the knee and ankle. The soft tissues are diffusely prominent. There are ill-defined calcifications within the calf which may be associated with an underlying mass. IMPRESSION: 1. Possible partially calcified mass in the right calf. This has a nonspecific radiographic appearance. Further evaluation with MRI without and with  contrast recommended. 2. Generalized soft tissue prominence in the right lower leg. 3. No acute osseous findings. Electronically Signed   By: WRichardean SaleM.D.   On: 02/03/2022 09:58   VAS [place]OWER EXTREMITY VENOUS (DVT) (7a-7p)  Result Date: 02/03/2022  Lower Venous DVT Study Patient Name:  ASOHIL TIMKO Date of Exam:   02/03/2022 Medical Rec #: [id number]     Accession #:    [id number] of Birth: 5October 29, 1982     Patient Gender: M Patient Age:   89years [facility] [place]OWER EXTREMITY VENOUS (DVT) Referring Phys: GRACE LOEFFLER --------------------------------------------------------------------------------  Indications: Focal area of right medial calf enlargement x1 month with right ankle and foot edema.  Comparison Study: No prior study Performing Technologist: MMaudry MayhewMHA, RDMS, RVT, RDCS  Examination Guidelines: A complete evaluation includes B-mode imaging, spectral Doppler, color Doppler, and power Doppler as needed of all accessible portions of  each vessel. Bilateral testing is considered an integral part of a complete examination. Limited examinations for reoccurring indications may be performed as noted. The reflux portion of the exam is performed with the patient in reverse Trendelenburg.  +---------+---------------+---------+-----------+----------+--------------+ RIGHT    CompressibilityPhasicitySpontaneityPropertiesThrombus Aging +---------+---------------+---------+-----------+----------+--------------+ CFV      Full           Yes      Yes                                 +---------+---------------+---------+-----------+----------+--------------+ SFJ      Full                                                        +---------+---------------+---------+-----------+----------+--------------+ FV Prox  Full                                                         +---------+---------------+---------+-----------+----------+--------------+ FV Mid   Full                                                        +---------+---------------+---------+-----------+----------+--------------+ FV DistalFull                                                        +---------+---------------+---------+-----------+----------+--------------+ PFV      Full                                                        +---------+---------------+---------+-----------+----------+--------------+ POP      None                    No                   Acute          +---------+---------------+---------+-----------+----------+--------------+ TPT      None                    No                   Acute          +---------+---------------+---------+-----------+----------+--------------+   Right Technical Findings: Unable to visualize PTV and peroneal veins secondary to large calf mass obscuring visualization of vessels.  +----+---------------+---------+-----------+----------+--------------+ LEFTCompressibilityPhasicitySpontaneityPropertiesThrombus Aging +----+---------------+---------+-----------+----------+--------------+ CFV Full           Yes      Yes                                 +----+---------------+---------+-----------+----------+--------------+  Summary: RIGHT: - Findings consistent with acute deep vein thrombosis involving the right popliteal vein, and tibioperoneal trunk. - In the medial right calf, the area of concern, there is a highly vascularized heterogenous mass with no clear borders. Etiology is unknown; neoplasm cannot be excluded. Recommend further imaging if clinically indicated. - No cystic structure found in the popliteal fossa.  LEFT: - No evidence of common femoral vein obstruction.  *See table(s) above for measurements and observations.    Preliminary     Procedures Procedures    Medications Ordered in ED Medications  lactated  ringers bolus 1,000 mL (0 mLs Intravenous Stopped 02/03/22 1222)  gadobutrol (GADAVIST) 1 MMOL/ML injection 10 mL (10 mLs Intravenous Contrast Given 02/03/22 1404)  apixaban (ELIQUIS) tablet 10 mg (10 mg Oral Given 02/03/22 1546)    ED Course/ Medical Decision Making/ A&P Clinical Course as of 02/03/22 1554  Mon Feb 03, 2022  1018 Ultrasound shows mass plus a DVT.  Given concern for likely cancerous muscular lesion, will get MRI which was ordered from triage.  Labs pending.  No signs/symptoms of PE. [SG]  1443 Discussed with Hilbert Odor, who recommends consultation with [facility] orthopedics as we do not have a specialist that would typically be involved in cancerous muscular lesions.  [facility] will be consulted. [SG]    Clinical Course User Index [SG] Sherwood Gambler, MD                           Medical Decision Making Amount and/or Complexity of Data Reviewed Labs:     Details: Mildly low potassium, slightly high calcium, normal WBC Radiology: independent interpretation performed.    Details: Tip/fib x-ray without obvious fracture or bony abnormality but sizable soft tissue swelling and some calcification  Risk Prescription drug management.   As above, discussed with [facility] orthopedics.  Discussed with Dr. Jefferson Fuel.  Not really an indication for an emergent transfer but does need more urgent referral and thus I discussed with the office of Dr. Magda Bernheim, who Dr. Jefferson Fuel said should be getting the referral.  Otherwise, MRI has been sent over electronically to [facility] but we will also give him a hardcopy disc.  He does have a DVT.  He has a strong pulse so I am not concerned about an acute vascular emergency and he does not seem to have compartment syndrome though he is obviously quite swollen.  At this point, while this could be a vascular issue, I think he needs to be on Eliquis and we have discussed strict return precautions if he were to develop acute swelling  to the leg.  He verbalized understanding.        Final Clinical Impression(s) / ED Diagnoses Final diagnoses:  Leg mass, right  Acute deep vein thrombosis (DVT) of popliteal vein of right lower extremity ([place])    Rx / DC Orders ED Discharge Orders          Ordered    APIXABAN (ELIQUIS) VTE STARTER PACK ('10MG'$  AND '5MG'$ )        02/03/22 1523              Sherwood Gambler, MD 02/03/22 1556

## 2022-02-03 NOTE — ED Provider Triage Note (Signed)
Emergency Medicine Provider Triage Evaluation Note  [name] , a 41 y.o. male  was evaluated in triage.  Pt complains of right leg swelling.  Patient presents with swelling of the right leg.  This has been going on for the past 3 months.  Before 3 months ago, he had this intermittently over the past year.  He denies any pain in his leg.  It does not bother him with ambulation.  He has no redness, fevers, chills.  He denies chest pain or shortness of breath.  Review of Systems  Positive: Leg swelling Negative:   Physical Exam  BP (!) 161/98 (BP Location: Right Arm)   Pulse 93   Temp 98.8 F (37.1 C) (Oral)   Resp 14   SpO2 100%  Gen:   Awake, no distress   Resp:  Normal effort  MSK:   Moves extremities without difficulty  Other:  Right leg edema, non pitting, significantly larger than left leg. No redness, drainage or tenderness noted.   Medical Decision Making  Medically screening exam initiated at 8:14 AM.  Appropriate orders placed.  [name] was informed that the remainder of the evaluation will be completed by another provider, this initial triage assessment does not replace that evaluation, and the importance of remaining in the ED until their evaluation is complete.     Adolphus Birchwood, [place] 02/03/22 [phone]

## 2022-02-03 NOTE — ED Notes (Signed)
Dc instructions reviewed with pt. Pt verbalized understanding. Pt DC.

## 2022-02-03 NOTE — Discharge Instructions (Addendum)
You are being referred to [facility] for a orthopedic specialist to evaluate your right calf mass. You also have a blood clot in that same leg and so you are being given a blood thinner.  It is possible this blood thinner could cause bleeding in your leg/this mass, so if you develop new or worsening pain, swelling, or any other new/concerning symptoms then call 911 or return to the ER  This blood thinner does increase your chance of bleeding and to you CANNOT take NSAIDs such as ibuprofen, Advil, Aleve, naproxen, etc.  You ARE allowed to take Tylenol.

## 2022-02-03 NOTE — ED Triage Notes (Signed)
Pt states his right lower leg has been swelling off and on for about 6 months. Right lower leg is significantly larger than his left leg. Pt states it hasn't gone down in several months. Denies any injury, pain, only tightness.

## 2022-07-16 ENCOUNTER — Encounter (HOSPITAL_COMMUNITY): Payer: Self-pay | Admitting: Family Medicine

## 2022-07-16 ENCOUNTER — Inpatient Hospital Stay (HOSPITAL_COMMUNITY)
Admission: AD | Admit: 2022-07-16 | Discharge: 2022-08-08 | DRG: 871 | Disposition: A | Discharge status: Home / Self Care | Payer: BC Managed Care – PPO | Attending: Internal Medicine | Admitting: Internal Medicine

## 2022-07-16 ENCOUNTER — Inpatient Hospital Stay (HOSPITAL_COMMUNITY): Payer: BC Managed Care – PPO

## 2022-07-16 DIAGNOSIS — K7030 Alcoholic cirrhosis of liver without ascites: Secondary | ICD-10-CM | POA: Diagnosis present

## 2022-07-16 DIAGNOSIS — I2721 Secondary pulmonary arterial hypertension: Secondary | ICD-10-CM | POA: Diagnosis present

## 2022-07-16 DIAGNOSIS — Z5900 Homelessness unspecified: Secondary | ICD-10-CM

## 2022-07-16 DIAGNOSIS — E871 Hypo-osmolality and hyponatremia: Secondary | ICD-10-CM | POA: Diagnosis present

## 2022-07-16 DIAGNOSIS — R6521 Severe sepsis with septic shock: Secondary | ICD-10-CM | POA: Diagnosis present

## 2022-07-16 DIAGNOSIS — Z560 Unemployment, unspecified: Secondary | ICD-10-CM

## 2022-07-16 DIAGNOSIS — J9690 Respiratory failure, unspecified, unspecified whether with hypoxia or hypercapnia: Secondary | ICD-10-CM | POA: Diagnosis not present

## 2022-07-16 DIAGNOSIS — E876 Hypokalemia: Secondary | ICD-10-CM | POA: Diagnosis present

## 2022-07-16 DIAGNOSIS — C7801 Secondary malignant neoplasm of right lung: Secondary | ICD-10-CM | POA: Diagnosis present

## 2022-07-16 DIAGNOSIS — I2724 Chronic thromboembolic pulmonary hypertension: Secondary | ICD-10-CM | POA: Diagnosis present

## 2022-07-16 DIAGNOSIS — E860 Dehydration: Secondary | ICD-10-CM | POA: Diagnosis present

## 2022-07-16 DIAGNOSIS — G9341 Metabolic encephalopathy: Secondary | ICD-10-CM | POA: Diagnosis present

## 2022-07-16 DIAGNOSIS — E222 Syndrome of inappropriate secretion of antidiuretic hormone: Secondary | ICD-10-CM | POA: Diagnosis present

## 2022-07-16 DIAGNOSIS — E440 Moderate protein-calorie malnutrition: Secondary | ICD-10-CM | POA: Insufficient documentation

## 2022-07-16 DIAGNOSIS — C7802 Secondary malignant neoplasm of left lung: Secondary | ICD-10-CM | POA: Diagnosis present

## 2022-07-16 DIAGNOSIS — J189 Pneumonia, unspecified organism: Principal | ICD-10-CM

## 2022-07-16 DIAGNOSIS — D539 Nutritional anemia, unspecified: Secondary | ICD-10-CM | POA: Insufficient documentation

## 2022-07-16 DIAGNOSIS — J9601 Acute respiratory failure with hypoxia: Secondary | ICD-10-CM | POA: Diagnosis present

## 2022-07-16 DIAGNOSIS — J470 Bronchiectasis with acute lower respiratory infection: Secondary | ICD-10-CM | POA: Diagnosis present

## 2022-07-16 DIAGNOSIS — A419 Sepsis, unspecified organism: Principal | ICD-10-CM | POA: Diagnosis present

## 2022-07-16 DIAGNOSIS — J13 Pneumonia due to Streptococcus pneumoniae: Secondary | ICD-10-CM | POA: Insufficient documentation

## 2022-07-16 DIAGNOSIS — J121 Respiratory syncytial virus pneumonia: Secondary | ICD-10-CM | POA: Insufficient documentation

## 2022-07-16 DIAGNOSIS — J1008 Influenza due to other identified influenza virus with other specified pneumonia: Secondary | ICD-10-CM | POA: Diagnosis present

## 2022-07-16 DIAGNOSIS — I301 Infective pericarditis: Secondary | ICD-10-CM | POA: Insufficient documentation

## 2022-07-16 DIAGNOSIS — Z86718 Personal history of other venous thrombosis and embolism: Secondary | ICD-10-CM

## 2022-07-16 DIAGNOSIS — C7800 Secondary malignant neoplasm of unspecified lung: Secondary | ICD-10-CM | POA: Insufficient documentation

## 2022-07-16 DIAGNOSIS — R6520 Severe sepsis without septic shock: Secondary | ICD-10-CM

## 2022-07-16 DIAGNOSIS — Z7901 Long term (current) use of anticoagulants: Secondary | ICD-10-CM

## 2022-07-16 DIAGNOSIS — K7460 Unspecified cirrhosis of liver: Secondary | ICD-10-CM | POA: Insufficient documentation

## 2022-07-16 DIAGNOSIS — I2782 Chronic pulmonary embolism: Secondary | ICD-10-CM | POA: Diagnosis present

## 2022-07-16 DIAGNOSIS — F1020 Alcohol dependence, uncomplicated: Secondary | ICD-10-CM | POA: Diagnosis present

## 2022-07-16 DIAGNOSIS — I82409 Acute embolism and thrombosis of unspecified deep veins of unspecified lower extremity: Secondary | ICD-10-CM | POA: Insufficient documentation

## 2022-07-16 DIAGNOSIS — Z1152 Encounter for screening for COVID-19: Secondary | ICD-10-CM

## 2022-07-16 DIAGNOSIS — R0902 Hypoxemia: Secondary | ICD-10-CM

## 2022-07-16 DIAGNOSIS — R2241 Localized swelling, mass and lump, right lower limb: Secondary | ICD-10-CM

## 2022-07-16 DIAGNOSIS — C4921 Malignant neoplasm of connective and soft tissue of right lower limb, including hip: Secondary | ICD-10-CM | POA: Diagnosis present

## 2022-07-16 DIAGNOSIS — C499 Malignant neoplasm of connective and soft tissue, unspecified: Secondary | ICD-10-CM | POA: Insufficient documentation

## 2022-07-16 DIAGNOSIS — Z515 Encounter for palliative care: Secondary | ICD-10-CM

## 2022-07-16 DIAGNOSIS — J9600 Acute respiratory failure, unspecified whether with hypoxia or hypercapnia: Secondary | ICD-10-CM | POA: Diagnosis present

## 2022-07-16 DIAGNOSIS — F1010 Alcohol abuse, uncomplicated: Secondary | ICD-10-CM | POA: Insufficient documentation

## 2022-07-16 DIAGNOSIS — Z6824 Body mass index (BMI) 24.0-24.9, adult: Secondary | ICD-10-CM

## 2022-07-16 LAB — I-STAT CHEM 8, ED
BUN: 14 mg/dL (ref 6–20)
Calcium, Ion: 1.37 mmol/L (ref 1.15–1.40)
Chloride: 92 mmol/L — ABNORMAL LOW (ref 98–111)
Creatinine, Ser: 0.8 mg/dL (ref 0.61–1.24)
Glucose, Bld: 130 mg/dL — ABNORMAL HIGH (ref 70–99)
HCT: 37 % — ABNORMAL LOW (ref 39.0–52.0)
Hemoglobin: 12.6 g/dL — ABNORMAL LOW (ref 13.0–17.0)
Potassium: 4 mmol/L (ref 3.5–5.1)
Sodium: 128 mmol/L — ABNORMAL LOW (ref 135–145)
TCO2: 25 mmol/L (ref 22–32)

## 2022-07-16 MED ORDER — ACETAMINOPHEN 325 MG PO TABS
650.0000 mg | ORAL_TABLET | Freq: Once | ORAL | Status: AC
  Administered 2022-07-16: 650 mg via ORAL
  Filled 2022-07-16: qty 2

## 2022-07-16 NOTE — MAU Provider Note (Signed)
Chief Complaint:  No chief complaint on file.   Event Date/Time   First Provider Initiated Contact with Patient 07/16/22 2159       HPI: [name] is a 41 y.o. male who presents to maternity admissions reporting shortness of breath for 3 days.  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  Was referred to [facility] but states "he fired me".  Appears dyspneic.  Other This is a new (Shortness of breath) problem. Episode onset: 3 days ago. The problem occurs constantly. The problem has been gradually worsening. Associated symptoms include coughing, fatigue and weakness. Pertinent negatives include no abdominal pain or chest pain. He has tried nothing for the symptoms.    Past Medical History: Past Medical History:  Diagnosis Date   Epistaxis    Tooth infection     Past obstetric history: OB History  No obstetric history on file.    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 On day 8, switch to one-'5mg'$  tablet twice daily. 1 each 0     I have reviewed patient's Past Medical Hx, Surgical Hx, Family Hx, Social Hx, medications and allergies.  ROS:  Review of Systems  Unable to perform ROS: Acuity of condition  Constitutional:  Positive for fatigue.  Respiratory:  Positive for cough, chest tightness and shortness of breath.    Cardiovascular:  Negative for chest pain.  Gastrointestinal:  Negative for abdominal pain.  Neurological:  Positive for weakness.   Other systems negative     Physical Exam  Patient Vitals for the past 24 hrs:  BP Temp Temp src Pulse Resp SpO2 Height Weight  07/16/22 2215 -- -- -- (!) 135 -- 100 % -- --  07/16/22 2213 (!) 145/96 99.8 F (37.7 C) Oral (!) 140 (!) 24 91 % '6\' 3"'$  (1.905 m) 91.9 kg  Oxygen saturation 90% on room air  Ill- appearing male in mild to moderate respiratory distress.  Increased work of breathing. Cardiovascular: Tachycardic.  Difficult to hear Heart Sounds  Respiratory: Increased effort, Lungs tight, low pitched wheezing bilaterally MS: RIght calf with significant edema, can bear weight Neurologic: Alert and oriented x 4.   Grossly nonfocal. Skin:  Warm and Dry Psych:  Affect appropriate.   Labs: No results found for this or any previous visit (from the past 24 hour(s)).    Imaging:  No results found.  MAU Course/MDM: I have reviewed the triage vital signs and the nursing notes.   Pertinent labs & imaging results that were available during my care of the patient were reviewed by me and considered in my medical decision making (see chart for details).      I have reviewed her medical records including past results,  notes and treatments.   I have ordered Oxygen at 6 lpm (only had simple mask available)  Consult Dr Cheral Marker in ED who accepts transfer.   Treatments in MAU included Oxygen Therapy.   Pt stable at time of Transfer  Assessment: Shortness of breath Known right calf mass Known DVT  Plan: Transfer to ED Rapid Response Nurse notified and came to transport patient.  Hansel Feinstein CNM, MSN Certified Nurse-Midwife 07/16/2022 10:25 PM

## 2022-07-16 NOTE — MAU Note (Signed)
2217 Mindy, RN Rapid at beside  Myself and Rapid RN transported to ED and placed in ED triage. Benjamine Mola, RN notified.

## 2022-07-16 NOTE — MAU Note (Signed)
..  [name] is a 41 y.o. at Unknown here in MAU reporting: shortness of breath for 3 days and congestions Reports his leg has been swollen   Vitals:   07/16/22 2213  BP: (!) 145/96  Pulse: (!) 140  Resp: (!) 24  Temp: 99.8 F (37.7 C)  SpO2: 91%       10:12 pm marie williams at bedside

## 2022-07-16 NOTE — ED Provider Triage Note (Addendum)
Emergency Medicine Provider Triage Evaluation Note  [name] , a 41 y.o. male  was evaluated in triage.  Pt complains of shortness of breath started 3 days ago.  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  Has had some chest tightness.  Feels like his heart is racing.  Some pain and swelling to right LE. No numbness, weakness, redness.  Told he likely had a cancerous mass however did not follow-up with this. No sick contact. Mild rhinorrhea. No fever, emesis.  Review of Systems  Positive: Shortness of breath, chest tightness Negative: Fever, emesis  Physical Exam  BP (!) 145/96 (BP Location: Right Arm)   Pulse (!) 135   Temp 99.8 F (37.7 C) (Oral)   Resp (!) 24   Ht '6\' 3"'$  (1.905 m)   Wt 91.9 kg   SpO2 100%   BMI 25.34 kg/m  Gen:   Awake, no distress   Cardiac: Tachycardic Resp:  Increased effort, rub bil, wheeze bil MSK:   Moves extremities without difficulty, RLE swelling Other:    Medical Decision Making  Medically screening exam initiated at 10:53 PM.  Appropriate orders placed.  [name] was informed that the remainder of the evaluation will be completed by another provider, this initial triage assessment does not replace that evaluation, and the importance of remaining in the ED until their evaluation is complete.  Hypoxic, tachycardic, tachypneic  Nursing aware patient needs room and back     Judithe Keetch A, PA-C 07/16/22 2317    Chauna Osoria A, PA-C 07/16/22 2327

## 2022-07-16 NOTE — ED Triage Notes (Addendum)
Pt wheeled down from MAU where he was mistakenly. Nonrebreather applied by nurses there. He reports starting feeling SOB 3 days ago. Denies any other symptoms. Reports he has completed his course of blood thinners for DVT. Yellow congestion from nose. HR 141. Sats 92% after being on nonrebreather. RN d/ced nonrebreather to assess RA sats. Sats 89%. Pleural rub noted upon auscultation. Pt appears unwell and is working to breath; tacypneic . Endorses cough starting a few days ago but nothing that caused him distress.

## 2022-07-16 NOTE — MAU Note (Signed)
2213 6L O2 applied per [name] CNM

## 2022-07-17 ENCOUNTER — Inpatient Hospital Stay (HOSPITAL_COMMUNITY): Payer: BC Managed Care – PPO

## 2022-07-17 DIAGNOSIS — C7802 Secondary malignant neoplasm of left lung: Secondary | ICD-10-CM

## 2022-07-17 DIAGNOSIS — C7801 Secondary malignant neoplasm of right lung: Secondary | ICD-10-CM

## 2022-07-17 DIAGNOSIS — I2782 Chronic pulmonary embolism: Secondary | ICD-10-CM | POA: Diagnosis present

## 2022-07-17 DIAGNOSIS — D539 Nutritional anemia, unspecified: Secondary | ICD-10-CM | POA: Diagnosis present

## 2022-07-17 DIAGNOSIS — J9600 Acute respiratory failure, unspecified whether with hypoxia or hypercapnia: Secondary | ICD-10-CM | POA: Diagnosis present

## 2022-07-17 DIAGNOSIS — C7800 Secondary malignant neoplasm of unspecified lung: Secondary | ICD-10-CM | POA: Insufficient documentation

## 2022-07-17 DIAGNOSIS — K7030 Alcoholic cirrhosis of liver without ascites: Secondary | ICD-10-CM | POA: Diagnosis present

## 2022-07-17 DIAGNOSIS — J9601 Acute respiratory failure with hypoxia: Secondary | ICD-10-CM | POA: Diagnosis present

## 2022-07-17 DIAGNOSIS — R6521 Severe sepsis with septic shock: Secondary | ICD-10-CM | POA: Diagnosis not present

## 2022-07-17 DIAGNOSIS — R531 Weakness: Secondary | ICD-10-CM | POA: Diagnosis not present

## 2022-07-17 DIAGNOSIS — J9690 Respiratory failure, unspecified, unspecified whether with hypoxia or hypercapnia: Secondary | ICD-10-CM | POA: Diagnosis present

## 2022-07-17 DIAGNOSIS — A419 Sepsis, unspecified organism: Secondary | ICD-10-CM | POA: Diagnosis present

## 2022-07-17 DIAGNOSIS — F1010 Alcohol abuse, uncomplicated: Secondary | ICD-10-CM

## 2022-07-17 DIAGNOSIS — E876 Hypokalemia: Secondary | ICD-10-CM | POA: Diagnosis present

## 2022-07-17 DIAGNOSIS — C4921 Malignant neoplasm of connective and soft tissue of right lower limb, including hip: Secondary | ICD-10-CM | POA: Diagnosis present

## 2022-07-17 DIAGNOSIS — J189 Pneumonia, unspecified organism: Secondary | ICD-10-CM | POA: Insufficient documentation

## 2022-07-17 DIAGNOSIS — I82409 Acute embolism and thrombosis of unspecified deep veins of unspecified lower extremity: Secondary | ICD-10-CM | POA: Insufficient documentation

## 2022-07-17 DIAGNOSIS — R2241 Localized swelling, mass and lump, right lower limb: Secondary | ICD-10-CM | POA: Insufficient documentation

## 2022-07-17 DIAGNOSIS — E440 Moderate protein-calorie malnutrition: Secondary | ICD-10-CM | POA: Diagnosis present

## 2022-07-17 DIAGNOSIS — R0902 Hypoxemia: Secondary | ICD-10-CM | POA: Diagnosis not present

## 2022-07-17 DIAGNOSIS — J470 Bronchiectasis with acute lower respiratory infection: Secondary | ICD-10-CM | POA: Diagnosis present

## 2022-07-17 DIAGNOSIS — F1020 Alcohol dependence, uncomplicated: Secondary | ICD-10-CM | POA: Diagnosis present

## 2022-07-17 DIAGNOSIS — J13 Pneumonia due to Streptococcus pneumoniae: Secondary | ICD-10-CM | POA: Diagnosis present

## 2022-07-17 DIAGNOSIS — Z5900 Homelessness unspecified: Secondary | ICD-10-CM | POA: Diagnosis not present

## 2022-07-17 DIAGNOSIS — K7460 Unspecified cirrhosis of liver: Secondary | ICD-10-CM | POA: Insufficient documentation

## 2022-07-17 DIAGNOSIS — C499 Malignant neoplasm of connective and soft tissue, unspecified: Secondary | ICD-10-CM | POA: Diagnosis not present

## 2022-07-17 DIAGNOSIS — R9431 Abnormal electrocardiogram [ECG] [EKG]: Secondary | ICD-10-CM | POA: Diagnosis not present

## 2022-07-17 DIAGNOSIS — J121 Respiratory syncytial virus pneumonia: Secondary | ICD-10-CM | POA: Diagnosis present

## 2022-07-17 DIAGNOSIS — J09X1 Influenza due to identified novel influenza A virus with pneumonia: Secondary | ICD-10-CM

## 2022-07-17 DIAGNOSIS — G9341 Metabolic encephalopathy: Secondary | ICD-10-CM | POA: Diagnosis present

## 2022-07-17 DIAGNOSIS — J1008 Influenza due to other identified influenza virus with other specified pneumonia: Secondary | ICD-10-CM | POA: Diagnosis present

## 2022-07-17 DIAGNOSIS — E871 Hypo-osmolality and hyponatremia: Secondary | ICD-10-CM | POA: Diagnosis present

## 2022-07-17 DIAGNOSIS — I301 Infective pericarditis: Secondary | ICD-10-CM | POA: Diagnosis present

## 2022-07-17 DIAGNOSIS — E222 Syndrome of inappropriate secretion of antidiuretic hormone: Secondary | ICD-10-CM | POA: Diagnosis present

## 2022-07-17 DIAGNOSIS — Z1152 Encounter for screening for COVID-19: Secondary | ICD-10-CM | POA: Diagnosis not present

## 2022-07-17 DIAGNOSIS — Z515 Encounter for palliative care: Secondary | ICD-10-CM | POA: Diagnosis not present

## 2022-07-17 DIAGNOSIS — R6520 Severe sepsis without septic shock: Secondary | ICD-10-CM

## 2022-07-17 LAB — HEPARIN LEVEL (UNFRACTIONATED)
Heparin Unfractionated: <0.1 IU/mL — ABNORMAL LOW (ref 0.30–0.70)
Heparin Unfractionated: <0.1 IU/mL — ABNORMAL LOW (ref 0.30–0.70)

## 2022-07-17 LAB — CBC WITH DIFFERENTIAL/PLATELET
Abs Immature Granulocytes: 0 10*3/uL (ref 0.00–0.07)
Abs Immature Granulocytes: 0 10*3/uL (ref 0.00–0.07)
Basophils Absolute: 0 10*3/uL (ref 0.0–0.1)
Basophils Absolute: 0 10*3/uL (ref 0.0–0.1)
Basophils Relative: 0 %
Basophils Relative: 0 %
Eosinophils Absolute: 0 10*3/uL (ref 0.0–0.5)
Eosinophils Absolute: 0 10*3/uL (ref 0.0–0.5)
Eosinophils Relative: 0 %
Eosinophils Relative: 0 %
HCT: 34.7 % — ABNORMAL LOW (ref 39.0–52.0)
HCT: 31 % — ABNORMAL LOW (ref 39.0–52.0)
Hemoglobin: 11.7 g/dL — ABNORMAL LOW (ref 13.0–17.0)
Hemoglobin: 10.6 g/dL — ABNORMAL LOW (ref 13.0–17.0)
Immature Granulocytes: 0 %
Lymphocytes Relative: 9 %
Lymphocytes Relative: 5 %
Lymphs Abs: 3.1 10*3/uL (ref 0.7–4.0)
Lymphs Abs: 1.8 10*3/uL (ref 0.7–4.0)
MCH: 36.8 pg — ABNORMAL HIGH (ref 26.0–34.0)
MCH: 37.5 pg — ABNORMAL HIGH (ref 26.0–34.0)
MCHC: 33.7 g/dL (ref 30.0–36.0)
MCHC: 34.2 g/dL (ref 30.0–36.0)
MCV: 109.1 fL — ABNORMAL HIGH (ref 80.0–100.0)
MCV: 109.5 fL — ABNORMAL HIGH (ref 80.0–100.0)
Monocytes Absolute: 1.7 10*3/uL — ABNORMAL HIGH (ref 0.1–1.0)
Monocytes Absolute: 3.2 10*3/uL — ABNORMAL HIGH (ref 0.1–1.0)
Monocytes Relative: 5 %
Monocytes Relative: 9 %
Neutro Abs: 29.5 10*3/uL — ABNORMAL HIGH (ref 1.7–7.7)
Neutro Abs: 30.4 10*3/uL — ABNORMAL HIGH (ref 1.7–7.7)
Neutrophils Relative %: 86 %
Neutrophils Relative %: 86 %
Platelets: 595 10*3/uL — ABNORMAL HIGH (ref 150–400)
Platelets: 499 10*3/uL — ABNORMAL HIGH (ref 150–400)
RBC: 3.18 MIL/uL — ABNORMAL LOW (ref 4.22–5.81)
RBC: 2.83 MIL/uL — ABNORMAL LOW (ref 4.22–5.81)
RDW: 13.2 % (ref 11.5–15.5)
RDW: 13.2 % (ref 11.5–15.5)
Smear Review: NORMAL
WBC: 34.3 10*3/uL — ABNORMAL HIGH (ref 4.0–10.5)
WBC: 35.4 10*3/uL — ABNORMAL HIGH (ref 4.0–10.5)
nRBC: 0 /100 WBC
nRBC: 0.1 % (ref 0.0–0.2)
nRBC: 0.1 % (ref 0.0–0.2)
nRBC: 1 /100 WBC — ABNORMAL HIGH

## 2022-07-17 LAB — I-STAT ARTERIAL BLOOD GAS, ED
Acid-Base Excess: 0 mmol/L (ref 0.0–2.0)
Acid-Base Excess: 2 mmol/L (ref 0.0–2.0)
Acid-base deficit: 1 mmol/L (ref 0.0–2.0)
Bicarbonate: 25.2 mmol/L (ref 20.0–28.0)
Bicarbonate: 29.9 mmol/L — ABNORMAL HIGH (ref 20.0–28.0)
Bicarbonate: 27.8 mmol/L (ref 20.0–28.0)
Calcium, Ion: 1.38 mmol/L (ref 1.15–1.40)
Calcium, Ion: 1.53 mmol/L (ref 1.15–1.40)
Calcium, Ion: 1.46 mmol/L — ABNORMAL HIGH (ref 1.15–1.40)
HCT: 37 % — ABNORMAL LOW (ref 39.0–52.0)
HCT: 30 % — ABNORMAL LOW (ref 39.0–52.0)
HCT: 30 % — ABNORMAL LOW (ref 39.0–52.0)
Hemoglobin: 12.6 g/dL — ABNORMAL LOW (ref 13.0–17.0)
Hemoglobin: 10.2 g/dL — ABNORMAL LOW (ref 13.0–17.0)
Hemoglobin: 10.2 g/dL — ABNORMAL LOW (ref 13.0–17.0)
O2 Saturation: 94 %
O2 Saturation: 99 %
O2 Saturation: 100 %
Patient temperature: 37
Patient temperature: 100
Patient temperature: 99.2
Potassium: 3.4 mmol/L — ABNORMAL LOW (ref 3.5–5.1)
Potassium: 3.6 mmol/L (ref 3.5–5.1)
Potassium: 3.9 mmol/L (ref 3.5–5.1)
Sodium: 127 mmol/L — ABNORMAL LOW (ref 135–145)
Sodium: 136 mmol/L (ref 135–145)
Sodium: 134 mmol/L — ABNORMAL LOW (ref 135–145)
TCO2: 27 mmol/L (ref 22–32)
TCO2: 32 mmol/L (ref 22–32)
TCO2: 29 mmol/L (ref 22–32)
pCO2 arterial: 46.6 mmHg (ref 32–48)
pCO2 arterial: 87 mmHg (ref 32–48)
pCO2 arterial: 47.6 mmHg (ref 32–48)
pH, Arterial: 7.341 — ABNORMAL LOW (ref 7.35–7.45)
pH, Arterial: 7.148 — CL (ref 7.35–7.45)
pH, Arterial: 7.377 (ref 7.35–7.45)
pO2, Arterial: 74 mmHg — ABNORMAL LOW (ref 83–108)
pO2, Arterial: 173 mmHg — ABNORMAL HIGH (ref 83–108)
pO2, Arterial: 258 mmHg — ABNORMAL HIGH (ref 83–108)

## 2022-07-17 LAB — BASIC METABOLIC PANEL
Anion gap: 11 (ref 5–15)
BUN: 14 mg/dL (ref 6–20)
CO2: 26 mmol/L (ref 22–32)
Calcium: 11.1 mg/dL — ABNORMAL HIGH (ref 8.9–10.3)
Chloride: 92 mmol/L — ABNORMAL LOW (ref 98–111)
Creatinine, Ser: 0.96 mg/dL (ref 0.61–1.24)
GFR, Estimated: >60 mL/min (ref >60)
Glucose, Bld: 129 mg/dL — ABNORMAL HIGH (ref 70–99)
Potassium: 4 mmol/L (ref 3.5–5.1)
Sodium: 129 mmol/L — ABNORMAL LOW (ref 135–145)

## 2022-07-17 LAB — COMPREHENSIVE METABOLIC PANEL
ALT: 15 U/L (ref 0–44)
AST: 38 U/L (ref 15–41)
Albumin: 2.4 g/dL — ABNORMAL LOW (ref 3.5–5.0)
Alkaline Phosphatase: 79 U/L (ref 38–126)
Anion gap: 10 (ref 5–15)
BUN: 12 mg/dL (ref 6–20)
CO2: 22 mmol/L (ref 22–32)
Calcium: 9.7 mg/dL (ref 8.9–10.3)
Chloride: 96 mmol/L — ABNORMAL LOW (ref 98–111)
Creatinine, Ser: 0.84 mg/dL (ref 0.61–1.24)
GFR, Estimated: >60 mL/min (ref >60)
Glucose, Bld: 147 mg/dL — ABNORMAL HIGH (ref 70–99)
Potassium: 3.3 mmol/L — ABNORMAL LOW (ref 3.5–5.1)
Sodium: 128 mmol/L — ABNORMAL LOW (ref 135–145)
Total Bilirubin: 2.1 mg/dL — ABNORMAL HIGH (ref 0.3–1.2)
Total Protein: 6.9 g/dL (ref 6.5–8.1)

## 2022-07-17 LAB — RENAL FUNCTION PANEL
Albumin: 2.3 g/dL — ABNORMAL LOW (ref 3.5–5.0)
Anion gap: 8 (ref 5–15)
BUN: 10 mg/dL (ref 6–20)
CO2: 24 mmol/L (ref 22–32)
Calcium: 9.7 mg/dL (ref 8.9–10.3)
Chloride: 97 mmol/L — ABNORMAL LOW (ref 98–111)
Creatinine, Ser: 0.71 mg/dL (ref 0.61–1.24)
GFR, Estimated: >60 mL/min (ref >60)
Glucose, Bld: 118 mg/dL — ABNORMAL HIGH (ref 70–99)
Phosphorus: 2.2 mg/dL — ABNORMAL LOW (ref 2.5–4.6)
Potassium: 3.3 mmol/L — ABNORMAL LOW (ref 3.5–5.1)
Sodium: 129 mmol/L — ABNORMAL LOW (ref 135–145)

## 2022-07-17 LAB — CBC
HCT: 30.3 % — ABNORMAL LOW (ref 39.0–52.0)
HCT: 30.8 % — ABNORMAL LOW (ref 39.0–52.0)
Hemoglobin: 10.3 g/dL — ABNORMAL LOW (ref 13.0–17.0)
Hemoglobin: 9.8 g/dL — ABNORMAL LOW (ref 13.0–17.0)
MCH: 37.1 pg — ABNORMAL HIGH (ref 26.0–34.0)
MCH: 36.6 pg — ABNORMAL HIGH (ref 26.0–34.0)
MCHC: 34 g/dL (ref 30.0–36.0)
MCHC: 31.8 g/dL (ref 30.0–36.0)
MCV: 109 fL — ABNORMAL HIGH (ref 80.0–100.0)
MCV: 114.9 fL — ABNORMAL HIGH (ref 80.0–100.0)
Platelets: 451 10*3/uL — ABNORMAL HIGH (ref 150–400)
Platelets: 427 10*3/uL — ABNORMAL HIGH (ref 150–400)
RBC: 2.78 MIL/uL — ABNORMAL LOW (ref 4.22–5.81)
RBC: 2.68 MIL/uL — ABNORMAL LOW (ref 4.22–5.81)
RDW: 13.2 % (ref 11.5–15.5)
RDW: 13.3 % (ref 11.5–15.5)
WBC: 37 10*3/uL — ABNORMAL HIGH (ref 4.0–10.5)
WBC: 39.4 10*3/uL — ABNORMAL HIGH (ref 4.0–10.5)
nRBC: 0 % (ref 0.0–0.2)
nRBC: 0 % (ref 0.0–0.2)

## 2022-07-17 LAB — MAGNESIUM
Magnesium: 1.3 mg/dL — ABNORMAL LOW (ref 1.7–2.4)
Magnesium: 1.3 mg/dL — ABNORMAL LOW (ref 1.7–2.4)
Magnesium: 1.5 mg/dL — ABNORMAL LOW (ref 1.7–2.4)

## 2022-07-17 LAB — STREP PNEUMONIAE URINARY ANTIGEN: Strep Pneumo Urinary Antigen: POSITIVE — AB

## 2022-07-17 LAB — RESP PANEL BY RT-PCR (RSV, FLU A&B, COVID)  RVPGX2
Influenza A by PCR: POSITIVE — AB
Influenza B by PCR: NEGATIVE
Resp Syncytial Virus by PCR: NEGATIVE
SARS Coronavirus 2 by RT PCR: NEGATIVE

## 2022-07-17 LAB — PHOSPHORUS
Phosphorus: 2.4 mg/dL — ABNORMAL LOW (ref 2.5–4.6)
Phosphorus: 3.6 mg/dL (ref 2.5–4.6)

## 2022-07-17 LAB — LACTIC ACID, PLASMA
Lactic Acid, Venous: 1.8 mmol/L (ref 0.5–1.9)
Lactic Acid, Venous: 1.1 mmol/L (ref 0.5–1.9)

## 2022-07-17 LAB — CREATININE, SERUM
Creatinine, Ser: 0.61 mg/dL (ref 0.61–1.24)
GFR, Estimated: >60 mL/min (ref >60)

## 2022-07-17 LAB — TROPONIN I (HIGH SENSITIVITY)
Troponin I (High Sensitivity): 21 ng/L — ABNORMAL HIGH (ref <18)
Troponin I (High Sensitivity): 23 ng/L — ABNORMAL HIGH (ref <18)

## 2022-07-17 LAB — HIV ANTIBODY (ROUTINE TESTING W REFLEX): HIV Screen 4th Generation wRfx: NONREACTIVE

## 2022-07-17 LAB — MRSA NEXT GEN BY PCR, NASAL: MRSA by PCR Next Gen: NOT DETECTED

## 2022-07-17 LAB — GLUCOSE, CAPILLARY: Glucose-Capillary: 75 mg/dL (ref 70–99)

## 2022-07-17 LAB — CBG MONITORING, ED
Glucose-Capillary: 115 mg/dL — ABNORMAL HIGH (ref 70–99)
Glucose-Capillary: 120 mg/dL — ABNORMAL HIGH (ref 70–99)

## 2022-07-17 MED ORDER — THIAMINE MONONITRATE 100 MG PO TABS
100.0000 mg | ORAL_TABLET | Freq: Every day | ORAL | Status: DC
  Administered 2022-07-17: 100 mg via ORAL
  Filled 2022-07-17: qty 1

## 2022-07-17 MED ORDER — ACETAMINOPHEN 650 MG RE SUPP
325.0000 mg | RECTAL | Status: DC | PRN
  Administered 2022-07-17: 325 mg via RECTAL
  Filled 2022-07-17: qty 1

## 2022-07-17 MED ORDER — ROCURONIUM BROMIDE 50 MG/5ML IV SOLN
INTRAVENOUS | Status: DC | PRN
  Administered 2022-07-17: 100 mg via INTRAVENOUS

## 2022-07-17 MED ORDER — ADULT MULTIVITAMIN W/MINERALS CH
1.0000 | ORAL_TABLET | Freq: Every day | ORAL | Status: DC
  Administered 2022-07-17: 1 via ORAL
  Filled 2022-07-17: qty 1

## 2022-07-17 MED ORDER — IPRATROPIUM BROMIDE 0.02 % IN SOLN: 0.5000 mg | Freq: Four times a day (QID) | RESPIRATORY_TRACT | Status: AC | PRN

## 2022-07-17 MED ORDER — BUDESONIDE 0.25 MG/2ML IN SUSP
0.2500 mg | Freq: Two times a day (BID) | RESPIRATORY_TRACT | Status: DC
  Administered 2022-07-17 – 2022-08-08 (×43): 0.25 mg via RESPIRATORY_TRACT
  Filled 2022-07-17 (×45): qty 2

## 2022-07-17 MED ORDER — FENTANYL CITRATE PF 50 MCG/ML IJ SOSY
PREFILLED_SYRINGE | INTRAMUSCULAR | Status: AC
  Filled 2022-07-17: qty 2

## 2022-07-17 MED ORDER — NOREPINEPHRINE 4 MG/250ML-% IV SOLN
2.0000 ug/min | INTRAVENOUS | Status: DC
  Administered 2022-07-18: 4 ug/min via INTRAVENOUS
  Administered 2022-07-18: 2 ug/min via INTRAVENOUS
  Filled 2022-07-17 (×2): qty 250

## 2022-07-17 MED ORDER — LORAZEPAM 2 MG/ML IJ SOLN
1.0000 mg | INTRAMUSCULAR | Status: DC | PRN
  Administered 2022-07-17: 4 mg via INTRAVENOUS
  Administered 2022-07-17 (×2): 2 mg via INTRAVENOUS
  Filled 2022-07-17: qty 1
  Filled 2022-07-17: qty 2
  Filled 2022-07-17: qty 1

## 2022-07-17 MED ORDER — INSULIN ASPART 100 UNIT/ML IJ SOLN
0.0000 [IU] | INTRAMUSCULAR | Status: DC
  Administered 2022-07-18 (×2): 2 [IU] via SUBCUTANEOUS
  Administered 2022-07-18: 1 [IU] via SUBCUTANEOUS
  Administered 2022-07-18: 2 [IU] via SUBCUTANEOUS
  Administered 2022-07-18: 1 [IU] via SUBCUTANEOUS
  Administered 2022-07-19: 2 [IU] via SUBCUTANEOUS
  Administered 2022-07-19 (×2): 1 [IU] via SUBCUTANEOUS

## 2022-07-17 MED ORDER — SODIUM CHLORIDE 0.9 % IV BOLUS (SEPSIS)
1000.0000 mL | Freq: Once | INTRAVENOUS | Status: AC
  Administered 2022-07-17: 1000 mL via INTRAVENOUS

## 2022-07-17 MED ORDER — PIPERACILLIN-TAZOBACTAM 3.375 G IVPB
3.3750 g | Freq: Three times a day (TID) | INTRAVENOUS | Status: DC
  Administered 2022-07-17 (×2): 3.375 g via INTRAVENOUS
  Filled 2022-07-17 (×2): qty 50

## 2022-07-17 MED ORDER — FENTANYL CITRATE PF 50 MCG/ML IJ SOSY: 50.0000 ug | PREFILLED_SYRINGE | Freq: Once | INTRAMUSCULAR | Status: DC

## 2022-07-17 MED ORDER — MIDAZOLAM HCL 2 MG/2ML IJ SOLN
INTRAMUSCULAR | Status: AC
  Filled 2022-07-17: qty 2

## 2022-07-17 MED ORDER — FENTANYL 2500MCG IN NS 250ML (10MCG/ML) PREMIX INFUSION
50.0000 ug/h | INTRAVENOUS | Status: DC
  Administered 2022-07-17: 50 ug/h via INTRAVENOUS
  Administered 2022-07-18: 200 ug/h via INTRAVENOUS
  Administered 2022-07-18: 100 ug/h via INTRAVENOUS
  Administered 2022-07-18: 150 ug/h via INTRAVENOUS
  Filled 2022-07-17 (×4): qty 250

## 2022-07-17 MED ORDER — PROPOFOL 1000 MG/100ML IV EMUL
0.0000 ug/kg/min | INTRAVENOUS | Status: DC
  Administered 2022-07-18 (×3): 40 ug/kg/min via INTRAVENOUS
  Administered 2022-07-18: 45 ug/kg/min via INTRAVENOUS
  Administered 2022-07-18: 40 ug/kg/min via INTRAVENOUS
  Filled 2022-07-17: qty 200
  Filled 2022-07-17 (×5): qty 100

## 2022-07-17 MED ORDER — LEVALBUTEROL HCL 0.63 MG/3ML IN NEBU
0.6300 mg | INHALATION_SOLUTION | Freq: Three times a day (TID) | RESPIRATORY_TRACT | Status: AC
  Administered 2022-07-17 – 2022-07-19 (×6): 0.63 mg via RESPIRATORY_TRACT
  Filled 2022-07-17 (×6): qty 3

## 2022-07-17 MED ORDER — SUCCINYLCHOLINE CHLORIDE 200 MG/10ML IV SOSY
PREFILLED_SYRINGE | INTRAVENOUS | Status: AC
  Filled 2022-07-17: qty 10

## 2022-07-17 MED ORDER — POLYETHYLENE GLYCOL 3350 17 G PO PACK: 17.0000 g | PACK | Freq: Every day | ORAL | Status: DC | PRN

## 2022-07-17 MED ORDER — SODIUM CHLORIDE 0.9 % IV SOLN
500.0000 mg | INTRAVENOUS | Status: DC
  Administered 2022-07-17: 500 mg via INTRAVENOUS
  Filled 2022-07-17 (×2): qty 5

## 2022-07-17 MED ORDER — VANCOMYCIN HCL 1500 MG/300ML IV SOLN
1500.0000 mg | Freq: Once | INTRAVENOUS | Status: AC
  Administered 2022-07-17: 1500 mg via INTRAVENOUS
  Filled 2022-07-17: qty 300

## 2022-07-17 MED ORDER — SODIUM CHLORIDE 0.9 % IV SOLN: 2.0000 g | INTRAVENOUS | Status: DC

## 2022-07-17 MED ORDER — SODIUM CHLORIDE 0.9 % IV SOLN: 1000.0000 mL | INTRAVENOUS | Status: DC

## 2022-07-17 MED ORDER — VANCOMYCIN HCL 1250 MG/250ML IV SOLN
1250.0000 mg | Freq: Three times a day (TID) | INTRAVENOUS | Status: DC
  Filled 2022-07-17: qty 250

## 2022-07-17 MED ORDER — DOCUSATE SODIUM 100 MG PO CAPS: 100.0000 mg | ORAL_CAPSULE | Freq: Two times a day (BID) | ORAL | Status: DC | PRN

## 2022-07-17 MED ORDER — ORAL CARE MOUTH RINSE: 15.0000 mL | OROMUCOSAL | Status: DC | PRN

## 2022-07-17 MED ORDER — ORAL CARE MOUTH RINSE
15.0000 mL | OROMUCOSAL | Status: DC
  Administered 2022-07-18 – 2022-07-19 (×17): 15 mL via OROMUCOSAL

## 2022-07-17 MED ORDER — ETOMIDATE 2 MG/ML IV SOLN
INTRAVENOUS | Status: AC
  Filled 2022-07-17: qty 20

## 2022-07-17 MED ORDER — SODIUM CHLORIDE 0.9 % IV SOLN
250.0000 mL | INTRAVENOUS | Status: DC
  Administered 2022-07-20 – 2022-07-26 (×2): 250 mL via INTRAVENOUS

## 2022-07-17 MED ORDER — OSELTAMIVIR PHOSPHATE 75 MG PO CAPS
75.0000 mg | ORAL_CAPSULE | Freq: Two times a day (BID) | ORAL | Status: DC
  Administered 2022-07-17 (×2): 75 mg via ORAL
  Filled 2022-07-17 (×3): qty 1

## 2022-07-17 MED ORDER — FENTANYL BOLUS VIA INFUSION
50.0000 ug | INTRAVENOUS | Status: DC | PRN
  Administered 2022-07-18 – 2022-07-19 (×3): 100 ug via INTRAVENOUS

## 2022-07-17 MED ORDER — METHYLPREDNISOLONE SODIUM SUCC 125 MG IJ SOLR
100.0000 mg | Freq: Two times a day (BID) | INTRAMUSCULAR | Status: DC
  Administered 2022-07-17 – 2022-07-18 (×2): 100 mg via INTRAVENOUS
  Filled 2022-07-17 (×2): qty 2

## 2022-07-17 MED ORDER — LORAZEPAM 1 MG PO TABS: 1.0000 mg | ORAL_TABLET | ORAL | Status: DC | PRN

## 2022-07-17 MED ORDER — HEPARIN BOLUS VIA INFUSION
3000.0000 [IU] | Freq: Once | INTRAVENOUS | Status: AC
  Administered 2022-07-17: 3000 [IU] via INTRAVENOUS
  Filled 2022-07-17: qty 3000

## 2022-07-17 MED ORDER — CHLORHEXIDINE GLUCONATE CLOTH 2 % EX PADS
6.0000 | MEDICATED_PAD | Freq: Every day | CUTANEOUS | Status: DC
  Administered 2022-07-17 – 2022-07-19 (×3): 6 via TOPICAL

## 2022-07-17 MED ORDER — VITAL HIGH PROTEIN PO LIQD
1000.0000 mL | ORAL | Status: DC
  Administered 2022-07-17: 1000 mL
  Filled 2022-07-17: qty 1000

## 2022-07-17 MED ORDER — LORAZEPAM 2 MG/ML IJ SOLN: 0.0000 mg | Freq: Two times a day (BID) | INTRAMUSCULAR | Status: DC

## 2022-07-17 MED ORDER — FAMOTIDINE 20 MG PO TABS
20.0000 mg | ORAL_TABLET | Freq: Two times a day (BID) | ORAL | Status: DC
  Administered 2022-07-17 – 2022-07-19 (×5): 20 mg
  Filled 2022-07-17 (×5): qty 1

## 2022-07-17 MED ORDER — HEPARIN BOLUS VIA INFUSION
4000.0000 [IU] | Freq: Once | INTRAVENOUS | Status: AC
  Administered 2022-07-17: 4000 [IU] via INTRAVENOUS
  Filled 2022-07-17: qty 4000

## 2022-07-17 MED ORDER — SODIUM CHLORIDE 0.9 % IV SOLN
2.0000 g | INTRAVENOUS | Status: DC
  Administered 2022-07-17: 2 g via INTRAVENOUS
  Filled 2022-07-17: qty 20

## 2022-07-17 MED ORDER — LEVALBUTEROL HCL 0.63 MG/3ML IN NEBU: 0.6300 mg | INHALATION_SOLUTION | Freq: Four times a day (QID) | RESPIRATORY_TRACT | Status: AC | PRN

## 2022-07-17 MED ORDER — PROPOFOL 1000 MG/100ML IV EMUL
INTRAVENOUS | Status: AC
  Administered 2022-07-17: 5 ug/kg/min via INTRAVENOUS
  Filled 2022-07-17: qty 100

## 2022-07-17 MED ORDER — LORAZEPAM 2 MG/ML IJ SOLN: 0.0000 mg | Freq: Four times a day (QID) | INTRAMUSCULAR | Status: DC

## 2022-07-17 MED ORDER — KETAMINE HCL 50 MG/5ML IJ SOSY
PREFILLED_SYRINGE | INTRAMUSCULAR | Status: AC
  Filled 2022-07-17: qty 10

## 2022-07-17 MED ORDER — PIPERACILLIN-TAZOBACTAM 3.375 G IVPB 30 MIN
3.3750 g | Freq: Once | INTRAVENOUS | Status: AC
  Administered 2022-07-17: 3.375 g via INTRAVENOUS
  Filled 2022-07-17: qty 50

## 2022-07-17 MED ORDER — VANCOMYCIN HCL 1500 MG/300ML IV SOLN
1500.0000 mg | Freq: Two times a day (BID) | INTRAVENOUS | Status: DC
  Filled 2022-07-17: qty 300

## 2022-07-17 MED ORDER — ROCURONIUM BROMIDE 10 MG/ML (PF) SYRINGE
PREFILLED_SYRINGE | INTRAVENOUS | Status: AC
  Filled 2022-07-17: qty 10

## 2022-07-17 MED ORDER — THIAMINE HCL 100 MG/ML IJ SOLN: 100.0000 mg | Freq: Every day | INTRAMUSCULAR | Status: DC

## 2022-07-17 MED ORDER — HEPARIN (PORCINE) 25000 UT/250ML-% IV SOLN
2350.0000 [IU]/h | INTRAVENOUS | Status: DC
  Administered 2022-07-17: 1600 [IU]/h via INTRAVENOUS
  Administered 2022-07-18: 2000 [IU]/h via INTRAVENOUS
  Filled 2022-07-17 (×3): qty 250

## 2022-07-17 MED ORDER — FENTANYL CITRATE (PF) 100 MCG/2ML IJ SOLN
INTRAMUSCULAR | Status: DC | PRN
  Administered 2022-07-17: 50 ug via INTRAVENOUS

## 2022-07-17 MED ORDER — IOHEXOL 350 MG/ML SOLN
75.0000 mL | Freq: Once | INTRAVENOUS | Status: AC | PRN
  Administered 2022-07-17: 75 mL via INTRAVENOUS

## 2022-07-17 MED ORDER — IPRATROPIUM BROMIDE 0.02 % IN SOLN
0.5000 mg | Freq: Three times a day (TID) | RESPIRATORY_TRACT | Status: AC
  Administered 2022-07-17 – 2022-07-19 (×6): 0.5 mg via RESPIRATORY_TRACT
  Filled 2022-07-17 (×6): qty 2.5

## 2022-07-17 MED ORDER — HEPARIN BOLUS VIA INFUSION
2700.0000 [IU] | Freq: Once | INTRAVENOUS | Status: AC
  Administered 2022-07-17: 2700 [IU] via INTRAVENOUS
  Filled 2022-07-17: qty 2700

## 2022-07-17 MED ORDER — PROSOURCE TF20 ENFIT COMPATIBL EN LIQD
60.0000 mL | Freq: Every day | ENTERAL | Status: DC
  Administered 2022-07-17 – 2022-07-19 (×3): 60 mL
  Filled 2022-07-17 (×3): qty 60

## 2022-07-17 MED ORDER — ETOMIDATE 2 MG/ML IV SOLN
INTRAVENOUS | Status: DC | PRN
  Administered 2022-07-17: 20 mg via INTRAVENOUS

## 2022-07-17 MED ORDER — METOPROLOL TARTRATE 5 MG/5ML IV SOLN
2.5000 mg | INTRAVENOUS | Status: AC
  Administered 2022-07-17: 2.5 mg via INTRAVENOUS
  Filled 2022-07-17: qty 5

## 2022-07-17 MED ORDER — FOLIC ACID 1 MG PO TABS
1.0000 mg | ORAL_TABLET | Freq: Every day | ORAL | Status: DC
  Administered 2022-07-17: 1 mg via ORAL
  Filled 2022-07-17: qty 1

## 2022-07-17 MED ORDER — PROPOFOL 1000 MG/100ML IV EMUL
INTRAVENOUS | Status: AC
  Filled 2022-07-17: qty 100

## 2022-07-17 MED ORDER — SODIUM CHLORIDE 0.9 % IV SOLN: 500.0000 mg | Freq: Every day | INTRAVENOUS | Status: DC

## 2022-07-17 MED ORDER — MIDAZOLAM HCL 5 MG/5ML IJ SOLN
INTRAMUSCULAR | Status: DC | PRN
  Administered 2022-07-17: 2 mg via INTRAVENOUS

## 2022-07-17 NOTE — ED Notes (Signed)
Patient respond to painful stimuli with movement of hand. Non-verbal at this time. Diaphoretic, with use of accessory muscles. Patient continues on oxygen. MD present at this time.

## 2022-07-17 NOTE — Progress Notes (Signed)
Pt transported from room 13 in ED to 2M13 without any complications.

## 2022-07-17 NOTE — Progress Notes (Signed)
ANTICOAGULATION CONSULT NOTE - Initial Consult  Pharmacy Consult for Heparin Indication: pulmonary embolus  No Known Allergies  Patient Measurements: Height: '6\' 3"'$  (190.5 cm) Weight: 91.9 kg (202 lb 11.2 oz) IBW/kg (Calculated) : 84.5  Vital Signs: Temp: 102.9 F (39.4 C) (12/27 2254) Temp Source: Oral (12/27 2254) BP: 118/77 (12/28 0100) Pulse Rate: 115 (12/28 0100)  Labs: Recent Labs    07/16/22 2300 07/16/22 2312 07/17/22 0050  HGB 11.7* 12.6*  --   HCT 34.7* 37.0*  --   PLT 595*  --   --   CREATININE 0.96 0.80  --   TROPONINIHS 21*  --  23*    Estimated Creatinine Clearance: 145.2 mL/min (by C-G formula based on SCr of 0.8 mg/dL).   Medical History: Past Medical History:  Diagnosis Date   Epistaxis    Tooth infection     Assessment: 41 y.o. male with PE for heparin Goal of Therapy:  Heparin level 0.3-0.7 units/ml Monitor platelets by anticoagulation protocol: Yes   Plan:  Heparin 4000 units IV bolus, then start heparin 1600 units/hr Check heparin level in 6 hours.   Zarielle Cea, [place] 07/17/2022,1:55 AM

## 2022-07-17 NOTE — Progress Notes (Signed)
Pharmacy Antibiotic Note  [name] is a 41 y.o. male admitted on 07/16/2022 with PE and pneumonia.  Pharmacy has been consulted for Vancomycin and Zosyn  dosing.  Plan: Vancomycin 1500 mg IV q12h Zosyn 3.375 g IV q8h   Height: '6\' 3"'$  (190.5 cm) Weight: 91.9 kg (202 lb 11.2 oz) IBW/kg (Calculated) : 84.5  Temp (24hrs), Avg:101.4 F (38.6 C), Min:99.8 F (37.7 C), Max:102.9 F (39.4 C)  Recent Labs  Lab 07/16/22 2300 07/16/22 2312 07/17/22 0050  WBC 34.3*  --   --   CREATININE 0.96 0.80  --   LATICACIDVEN  --   --  1.8    Estimated Creatinine Clearance: 145.2 mL/min (by C-G formula based on SCr of 0.8 mg/dL).    No Known Allergies   Caryl Pina 07/17/2022 3:14 AM

## 2022-07-17 NOTE — ED Provider Notes (Addendum)
[facility] EMERGENCY DEPARTMENT Provider Note  CSN: [id number] Arrival date & time: 07/16/22 2228  Chief Complaint(s) Shortness of Breath  HPI [name] is a 41 y.o. male with a history of alcohol use disorder who presents to the emergency department for 3 to 4 days of gradually worsening shortness of breath and cough. Noted to be febrile and hypoxic in triage. No chest pain.  No peripheral edema but patient does have right leg swelling.   On record review, patient had a known right leg mass that was MRI in July of this year.  MRI noted likely hemangioma but malignancy was unable to be ruled out.  It appears that patient was a no-show to orthopedic follow-up.  During the workup, it was also noted that patient had a DVT for which he was prescribed Eliquis.   The history is provided by the patient.    Past Medical History Past Medical History:  Diagnosis Date   Epistaxis    Tooth infection    There are no problems to display for this patient.  Home Medication(s) Prior to Admission medications   Medication Sig Start Date End Date Taking? Authorizing Provider  APIXABAN (ELIQUIS) VTE STARTER PACK ('10MG'$  AND '5MG'$ ) Take as directed on package: start with two-'5mg'$  tablets twice daily for 7 days. On day 8, switch to one-'5mg'$  tablet twice daily. 02/03/22   Sherwood Gambler, MD                                                                                                                                    Allergies Patient has no known allergies.  Review of Systems Review of Systems As noted in HPI  Physical Exam Vital Signs  I have reviewed the triage vital signs BP 118/77   Pulse (!) 115   Temp (!) 102.9 F (39.4 C) (Oral)   Resp (!) 23   Ht '6\' 3"'$  (1.905 m)   Wt 91.9 kg   SpO2 95%   BMI 25.34 kg/m   Physical Exam Vitals reviewed.  Constitutional:      General: He is not in acute distress.    Appearance: He is well-developed. He is not diaphoretic.   HENT:     Head: Normocephalic and atraumatic.     Nose: Nose normal.  Eyes:     General: No scleral icterus.       Right eye: No discharge.        Left eye: No discharge.     Conjunctiva/sclera: Conjunctivae normal.     Pupils: Pupils are equal, round, and reactive to light.  Cardiovascular:     Rate and Rhythm: Normal rate and regular rhythm.     Heart sounds: No murmur heard.    No friction rub. No gallop.  Pulmonary:     Effort: Pulmonary effort is normal. Tachypnea present. No respiratory distress.     Breath sounds: No stridor. Examination of  the right-upper field reveals rhonchi. Examination of the left-upper field reveals rhonchi. Examination of the right-middle field reveals rhonchi and rales. Examination of the left-middle field reveals rhonchi. Examination of the right-lower field reveals rhonchi and rales. Examination of the left-lower field reveals rhonchi. Rhonchi and rales present.  Abdominal:     General: There is no distension.     Palpations: Abdomen is soft.     Tenderness: There is no abdominal tenderness.  Musculoskeletal:        General: No tenderness.     Cervical back: Normal range of motion and neck supple.     Right lower leg: Swelling (large right leg swelling) present.  Skin:    General: Skin is warm and dry.     Findings: No erythema or rash.  Neurological:     Mental Status: He is alert and oriented to person, place, and time.     ED Results and Treatments Labs (all labs ordered are listed, but only abnormal results are displayed) Labs Reviewed  CBC WITH DIFFERENTIAL/PLATELET - Abnormal; Notable for the following components:      Result Value   WBC 34.3 (*)    RBC 3.18 (*)    Hemoglobin 11.7 (*)    HCT 34.7 (*)    MCV 109.1 (*)    MCH 36.8 (*)    Platelets 595 (*)    Neutro Abs 29.5 (*)    Monocytes Absolute 1.7 (*)    All other components within normal limits  BASIC METABOLIC PANEL - Abnormal; Notable for the following components:    Sodium 129 (*)    Chloride 92 (*)    Glucose, Bld 129 (*)    Calcium 11.1 (*)    All other components within normal limits  I-STAT CHEM 8, ED - Abnormal; Notable for the following components:   Sodium 128 (*)    Chloride 92 (*)    Glucose, Bld 130 (*)    Hemoglobin 12.6 (*)    HCT 37.0 (*)    All other components within normal limits  TROPONIN I (HIGH SENSITIVITY) - Abnormal; Notable for the following components:   Troponin I (High Sensitivity) 21 (*)    All other components within normal limits  TROPONIN I (HIGH SENSITIVITY) - Abnormal; Notable for the following components:   Troponin I (High Sensitivity) 23 (*)    All other components within normal limits  RESP PANEL BY RT-PCR (RSV, FLU A&B, COVID)  RVPGX2  CULTURE, BLOOD (ROUTINE X 2)  CULTURE, BLOOD (ROUTINE X 2)  LACTIC ACID, PLASMA  LACTIC ACID, PLASMA  PROTIME-INR                                                                                                                         EKG  EKG Interpretation  Date/Time:  Wednesday July 16 2022 22:59:52 EST Ventricular Rate:  136 PR Interval:    QRS Duration: 74 QT Interval:  368 QTC Calculation: 553 R Axis:  81 Text Interpretation: Long QTc Supraventricular tachycardia Septal infarct , age undetermined ST & T wave abnormality, consider lateral ischemia Abnormal ECG When compared with ECG of 03-Feb-2022 10:18, PREVIOUS ECG IS PRESENT Confirmed by Addison Lank [phone]) on 07/17/2022 1:15:59 AM       Radiology CT Angio Chest PE W and/or Wo Contrast  Result Date: 07/17/2022 CLINICAL DATA:  Pulmonary embolism. EXAM: CT ANGIOGRAPHY CHEST WITH CONTRAST TECHNIQUE: Multidetector CT imaging of the chest was performed using the standard protocol during bolus administration of intravenous contrast. Multiplanar CT image reconstructions and MIPs were obtained to evaluate the vascular anatomy. RADIATION DOSE REDUCTION: This exam was performed according to the departmental  dose-optimization program which includes automated exposure control, adjustment of the mA and/or kV according to patient size and/or use of iterative reconstruction technique. CONTRAST:  82m OMNIPAQUE IOHEXOL 350 MG/ML SOLN COMPARISON:  None Available. FINDINGS: Cardiovascular: There is adequate opacification of the pulmonary arterial tree. There are multiple synechia and eccentric filling defects within the right middle and lower lobar segmental pulmonary arteries in keeping with chronic pulmonary embolism. No intraluminal filling defect identified to suggest acute pulmonary emboli. The central pulmonary arteries are mildly enlarged in keeping with changes of pulmonary arterial hypertension. Global cardiac size within normal limits. No pericardial effusion. The thoracic aorta is unremarkable. There is an intraluminal filling defect within the right superior pulmonary vein which may represent intraluminal thrombus or intravascular extension of tumor. Mediastinum/Nodes: Visualized thyroid is unremarkable. No pathologic thoracic adenopathy. The esophagus is unremarkable. Lungs/Pleura: There are innumerable pulmonary masses seen bilaterally most in keeping with widespread pulmonary metastatic disease. Dominant mass within the left upper lobe measures 7.9 cm in greatest dimension. There is superimposed tree-in-bud nodularity throughout the right lung, consolidation within the right middle lobe, and bronchial wall thickening and bronchiolectasis best appreciated within the right lower lobe. These findings can be seen the setting of superimposed acute multilobar infection or, less likely, endobronchial extension of disease. No pneumothorax or pleural effusion. Layering debris is seen within the distal trachea. Upper Abdomen: The liver contour is nodular in keeping with underlying cirrhosis. No acute abnormality. Musculoskeletal: No acute bone abnormality. No lytic or blastic bone lesion. Review of the MIP images confirms  the above findings. IMPRESSION: 1. 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 No acute pulmonary emboli identified. 2. Innumerable pulmonary masses bilaterally most in keeping with widespread pulmonary metastatic disease. 3. Superimposed tree-in-bud nodularity throughout the right lung, consolidation within the right middle lobe, and bronchial wall thickening and bronchiolectasis best appreciated within the right lower lobe. These findings can be seen the setting of superimposed acute multilobar infection or, less likely, endobronchial extension of disease. 4. Intraluminal filling defect within the right superior pulmonary vein which may represent intraluminal thrombus or intravascular extension of tumor. 5. Morphologic changes in keeping with underlying pulmonary arterial hypertension. 6. Cirrhosis. Electronically Signed   By: AFidela SalisburyM.D.   On: 07/17/2022 01:16   DG Chest 2 View  Result Date: 07/16/2022 CLINICAL DATA:  Shortness of breath EXAM: CHEST - 2 VIEW COMPARISON:  04/21/2014 FINDINGS: Stable cardiomediastinal silhouette. Numerous bilateral nodular opacities measuring up to 2.8 cm on the right. Large subpleural mass measuring approximately 9.4 cm in the left midthorax. Hazy airspace opacity in the right lower lung. No pleural effusion or pneumothorax. No acute osseous abnormality. IMPRESSION: Large left mid lung subpleural mass. Numerous bilateral pulmonary nodules. Findings are concerning for metastases. Hazy opacity in the right lower  lobe is nonspecific and may be neoplastic or infectious. These findings would be better evaluated with CT chest with IV contrast. Electronically Signed   By: Placido Sou M.D.   On: 07/16/2022 23:27    Medications Ordered in ED Medications  piperacillin-tazobactam (ZOSYN) IVPB 3.375 g (3.375 g Intravenous New Bag/Given 07/17/22 0150)  vancomycin (VANCOREADY) IVPB 1500 mg/300 mL (has no administration  in time range)  sodium chloride 0.9 % bolus 1,000 mL (has no administration in time range)    Followed by  sodium chloride 0.9 % bolus 1,000 mL (has no administration in time range)    Followed by  0.9 %  sodium chloride infusion (has no administration in time range)  acetaminophen (TYLENOL) tablet 650 mg (650 mg Oral Given 07/16/22 2313)  iohexol (OMNIPAQUE) 350 MG/ML injection 75 mL (75 mLs Intravenous Contrast Given 07/17/22 0025)                                                                                                                                     Procedures .1-3 Lead EKG Interpretation  Performed by: Fatima Blank, MD Authorized by: Fatima Blank, MD     Interpretation: abnormal     ECG rate:  120   Rhythm: sinus tachycardia     Ectopy: none     Conduction: normal   .Critical Care  Performed by: Fatima Blank, MD Authorized by: Fatima Blank, MD   Critical care provider statement:    Critical care time (minutes):  45   Critical care time was exclusive of:  Separately billable procedures and treating other patients   Critical care was necessary to treat or prevent imminent or life-threatening deterioration of the following conditions:  Respiratory failure and sepsis   Critical care was time spent personally by me on the following activities:  Development of treatment plan with patient or surrogate, discussions with consultants, evaluation of patient's response to treatment, examination of patient, obtaining history from patient or surrogate, review of old charts, re-evaluation of patient's condition, pulse oximetry, ordering and review of radiographic studies, ordering and review of laboratory studies and ordering and performing treatments and interventions   Care discussed with: admitting provider     (including critical care time)  Medical Decision Making / ED Course   Medical Decision Making Amount and/or Complexity of Data  Reviewed External Data Reviewed: radiology.    Details: MRI Noted in HPI Labs: ordered. Decision-making details documented in ED Course. Radiology: ordered and independent interpretation performed. Decision-making details documented in ED Course. ECG/medicine tests: ordered and independent interpretation performed. Decision-making details documented in ED Course.  Risk Prescription drug management. Parenteral controlled substances. Decision regarding hospitalization.    Patient with increased work of breathing here for shortness of breath. Noted to be febrile, tachypneic, tachycardic and hypoxic. Differential includes but not limited to sepsis from pneumonia, viral infection, PE.  Given his known right leg  mass, will need to assess for possible malignancies.  Will also need to assess for severe anemia.  CBC with significant leukocytosis at 34,000, no significant anemia. Metabolic panel with mild hyponatremia.  Hypercalcemia at 11.  No renal insufficiency Chest x-rays concerning for metastatic disease and right lower lobe pneumonia CT PE study confirmed metastasis with pneumonia and also noted chronic pulmonary emboli.  Code sepsis was initiated and patient was started on empiric antibiotics.  Patient is currently on 3 L nasal cannula satting 95%.  Will admit patient for further workup and management.      Final Clinical Impression(s) / ED Diagnoses Final diagnoses:  Community acquired pneumonia of right lower lobe of lung  Malignant neoplasm metastatic to both lungs (HCC)  Hypoxia  Leg mass, right  Sepsis with acute hypoxic respiratory failure without septic shock, due to unspecified organism [facility])           This chart was dictated using voice recognition software.  Despite best efforts to proofread,  errors can occur which can change the documentation meaning.   Fatima Blank, MD 07/17/22 [phone]

## 2022-07-17 NOTE — ED Notes (Signed)
Pt id diaphoretic, HR 130's and RR 20's to 30's. Pt blood sugar is 115  and Temperature 102.9 Axillary. MD was paged.

## 2022-07-17 NOTE — ED Notes (Signed)
Pt was taken to CT on the way to their room.

## 2022-07-17 NOTE — Progress Notes (Signed)
[facility] Progress Note Patient Name: [name] DOB: 1981-05-31 MRN: [id number]   Date of Service  07/17/2022  HPI/Events of Note  BP 88/56, HR 85  eICU Interventions  Peripheral Norepinephrine gtt ordered.        Kerry Kass Eleri Ruben 07/17/2022, 11:44 PM

## 2022-07-17 NOTE — ED Notes (Addendum)
MD repaged at this time. Pt is unable to tolerate PO Tylenol for fever. Pt is restless and able to answer questions but falls asleep right away. Pt was also given Ativan IVP previously for CIWA of 21. Waiting for call back.

## 2022-07-17 NOTE — ED Notes (Signed)
ED TO INPATIENT HANDOFF REPORT  ED Nurse Name and Phone #: Philip Aspen Name/Age/Gender [name] 41 y.o. male Room/Bed: 013C/013C  Code Status   Code Status: Full Code  Home/SNF/Other Home  Is this baseline? No   Triage Complete: Triage complete  Chief Complaint Postobstructive pneumonia [J18.9]  Triage Note Pt wheeled down from MAU where he was mistakenly. Nonrebreather applied by nurses there. He reports starting feeling SOB 3 days ago. Denies any other symptoms. Reports he has completed his course of blood thinners for DVT. Yellow congestion from nose. HR 141. Sats 92% after being on nonrebreather. RN d/ced nonrebreather to assess RA sats. Sats 89%. Pleural rub noted upon auscultation. Pt appears unwell and is working to breath; tacypneic . Endorses cough starting a few days ago but nothing that caused him distress.    Allergies No Known Allergies  Level of Care/Admitting Diagnosis ED Disposition     ED Disposition  Admit   Condition  --   [place]: [place] [[id number]]  Level of Care: Progressive [102]  Admit to Progressive based on following criteria: RESPIRATORY PROBLEMS hypoxemic/hypercapnic respiratory failure that is responsive to NIPPV (BiPAP) or High Flow Nasal Cannula (6-80 lpm). Frequent assessment/intervention, no > Q2 hrs < Q4 hrs, to maintain oxygenation and pulmonary hygiene.  May admit patient to [facility] or [facility] if equivalent level of care is available:: Yes  Covid Evaluation: Asymptomatic - no recent exposure (last 10 days) testing not required  Diagnosis: Postobstructive pneumonia [650354]  Admitting Physician: [place], [place]  Attending Physician: Quintella Baton [[id number]]  Certification:: I certify this patient will need inpatient services for at least 2 midnights          B Medical/Surgery History Past Medical History:  Diagnosis Date   Epistaxis    Tooth infection    Past Surgical History:   Procedure Laterality Date   arm surgery       A IV Location/Drains/Wounds Patient Lines/Drains/Airways Status     Active Line/Drains/Airways     Name Placement date Placement time Site Days   Peripheral IV 02/03/22 18 G 1.16" Anterior;Left Forearm 02/03/22  1029  Forearm  164   Peripheral IV 07/17/22 18 G Right Antecubital 07/17/22  0016  Antecubital  less than 1   External Urinary Catheter 07/17/22  1044  --  less than 1            Intake/Output Last 24 hours  Intake/Output Summary (Last 24 hours) at 07/17/2022 1339 Last data filed at 07/17/2022 1044 Gross per 24 hour  Intake 2100 ml  Output 20 ml  Net 2080 ml    Labs/Imaging Results for orders placed or performed during the hospital encounter of 07/16/22 (from the past 48 hour(s))  CBC with Differential     Status: Abnormal   Collection Time: 07/16/22 11:00 PM  Result Value Ref Range   WBC 34.3 (H) 4.0 - 10.5 K/uL   RBC 3.18 (L) 4.22 - 5.81 MIL/uL   Hemoglobin 11.7 (L) 13.0 - 17.0 g/dL   HCT 34.7 (L) 39.0 - 52.0 %   MCV 109.1 (H) 80.0 - 100.0 fL   MCH 36.8 (H) 26.0 - 34.0 pg   MCHC 33.7 30.0 - 36.0 g/dL   RDW 13.2 11.5 - 15.5 %   Platelets 595 (H) 150 - 400 K/uL   nRBC 0.1 0.0 - 0.2 %   Neutrophils Relative % 86 %   Neutro Abs 29.5 (H) 1.7 -  7.7 K/uL   Lymphocytes Relative 9 %   Lymphs Abs 3.1 0.7 - 4.0 K/uL   Monocytes Relative 5 %   Monocytes Absolute 1.7 (H) 0.1 - 1.0 K/uL   Eosinophils Relative 0 %   Eosinophils Absolute 0.0 0.0 - 0.5 K/uL   Basophils Relative 0 %   Basophils Absolute 0.0 0.0 - 0.1 K/uL   WBC Morphology MILD LEFT SHIFT (1-5% METAS, OCC MYELO, OCC BANDS)    RBC Morphology POLYCHROMASIA PRESENT    Smear Review Normal platelet morphology    nRBC 0 0 /100 WBC   Immature Granulocytes 0 %   Abs Immature Granulocytes 0.00 0.00 - 0.07 K/uL    Comment: Performed at [facility] [address], [place], [place] [address]  Basic metabolic panel     Status: Abnormal   Collection  Time: 07/16/22 11:00 PM  Result Value Ref Range   Sodium 129 (L) 135 - 145 mmol/L   Potassium 4.0 3.5 - 5.1 mmol/L   Chloride 92 (L) 98 - 111 mmol/L   CO2 26 22 - 32 mmol/L   Glucose, Bld 129 (H) 70 - 99 mg/dL    Comment: Glucose reference range applies only to samples taken after fasting for at least 8 hours.   BUN 14 6 - 20 mg/dL   Creatinine, Ser 0.96 0.61 - 1.24 mg/dL   Calcium 11.1 (H) 8.9 - 10.3 mg/dL   GFR, Estimated >60 >60 mL/min    Comment: (NOTE) Calculated using the CKD-EPI Creatinine Equation (2021)    Anion gap 11 5 - 15    Comment: Performed at [facility] [address] [place], [place] [address]  Troponin I (High Sensitivity)     Status: Abnormal   Collection Time: 07/16/22 11:00 PM  Result Value Ref Range   Troponin I (High Sensitivity) 21 (H) <18 ng/L    Comment: (NOTE) Elevated high sensitivity troponin I (hsTnI) values and significant  changes across serial measurements may suggest ACS but many other  chronic and acute conditions are known to elevate hsTnI results.  Refer to the "Links" section for chest pain algorithms and additional  guidance. Performed at [facility], [place] [address] [place], [place] [address]   I-stat chem 8, ED (not at [facility], [facility] or [facility])     Status: Abnormal   Collection Time: 07/16/22 11:12 PM  Result Value Ref Range   Sodium 128 (L) 135 - 145 mmol/L   Potassium 4.0 3.5 - 5.1 mmol/L   Chloride 92 (L) 98 - 111 mmol/L   BUN 14 6 - 20 mg/dL   Creatinine, Ser 0.80 0.61 - 1.24 mg/dL   Glucose, Bld 130 (H) 70 - 99 mg/dL    Comment: Glucose reference range applies only to samples taken after fasting for at least 8 hours.   Calcium, Ion 1.37 1.15 - 1.40 mmol/L   TCO2 25 22 - 32 mmol/L   Hemoglobin 12.6 (L) 13.0 - 17.0 g/dL   HCT 37.0 (L) 39.0 - 52.0 %  Resp panel by RT-PCR (RSV, Flu A&B, Covid) Anterior Nasal Swab     Status: Abnormal   Collection Time: 07/17/22 12:50 AM   Specimen: Anterior Nasal Swab  Result Value Ref  Range   SARS Coronavirus 2 by RT PCR NEGATIVE NEGATIVE    Comment: (NOTE) SARS-CoV-2 target nucleic acids are NOT DETECTED.  The SARS-CoV-2 RNA is generally detectable in upper respiratory specimens during the acute phase of infection. The lowest  concentration of SARS-CoV-2 viral copies this assay can detect is 138 copies/mL. A negative result does not preclude SARS-Cov-2 infection and should not be used as the sole basis for treatment or other patient management decisions. A negative result may occur with  improper specimen collection/handling, submission of specimen other than nasopharyngeal swab, presence of viral mutation(s) within the areas targeted by this assay, and inadequate number of viral copies(<138 copies/mL). A negative result must be combined with clinical observations, patient history, and epidemiological information. The expected result is Negative.  Fact Sheet for Patients:  EntrepreneurPulse.com.au  Fact Sheet for Healthcare Providers:  IncredibleEmployment.be  This test is no t yet approved or cleared by the [place] FDA and  has been authorized for detection and/or diagnosis of SARS-CoV-2 by FDA under an Emergency Use Authorization (EUA). This EUA will remain  in effect (meaning this test can be used) for the duration of the COVID-19 declaration under Section 564(b)(1) of the Act, 21 U.S.C.section 360bbb-3(b)(1), unless the authorization is terminated  or revoked sooner.       Influenza A by PCR POSITIVE (A) NEGATIVE   Influenza B by PCR NEGATIVE NEGATIVE    Comment: (NOTE) The Xpert Xpress SARS-CoV-2/FLU/RSV plus assay is intended as an aid in the diagnosis of influenza from Nasopharyngeal swab specimens and should not be used as a sole basis for treatment. Nasal washings and aspirates are unacceptable for Xpert Xpress SARS-CoV-2/FLU/RSV testing.  Fact Sheet for  Patients: EntrepreneurPulse.com.au  Fact Sheet for Healthcare Providers: IncredibleEmployment.be  This test is not yet approved or cleared by the [place] FDA and has been authorized for detection and/or diagnosis of SARS-CoV-2 by FDA under an Emergency Use Authorization (EUA). This EUA will remain in effect (meaning this test can be used) for the duration of the COVID-19 declaration under Section 564(b)(1) of the Act, 21 U.S.C. section 360bbb-3(b)(1), unless the authorization is terminated or revoked.     Resp Syncytial Virus by PCR NEGATIVE NEGATIVE    Comment: (NOTE) Fact Sheet for Patients: EntrepreneurPulse.com.au  Fact Sheet for Healthcare Providers: IncredibleEmployment.be  This test is not yet approved or cleared by the [place] FDA and has been authorized for detection and/or diagnosis of SARS-CoV-2 by FDA under an Emergency Use Authorization (EUA). This EUA will remain in effect (meaning this test can be used) for the duration of the COVID-19 declaration under Section 564(b)(1) of the Act, 21 U.S.C. section 360bbb-3(b)(1), unless the authorization is terminated or revoked.  Performed at [facility], [place] [address], [place], [place] [address]   Lactic acid, plasma     Status: None   Collection Time: 07/17/22 12:50 AM  Result Value Ref Range   Lactic Acid, Venous 1.8 0.5 - 1.9 mmol/L    Comment: Performed at [facility] [address] [place], [place] [address]  Troponin I (High Sensitivity)     Status: Abnormal   Collection Time: 07/17/22 12:50 AM  Result Value Ref Range   Troponin I (High Sensitivity) 23 (H) <18 ng/L    Comment: (NOTE) Elevated high sensitivity troponin I (hsTnI) values and significant  changes across serial measurements may suggest ACS but many other  chronic and acute conditions are known to elevate hsTnI results.  Refer to the "Links" section for  chest pain algorithms and additional  guidance. Performed at [facility], [place] [address], [place], [place] [address]   Strep pneumoniae urinary antigen     Status: Abnormal   Collection Time:  07/17/22  2:49 AM  Result Value Ref Range   Strep Pneumo Urinary Antigen POSITIVE (A) NEGATIVE    Comment: Performed at [facility] [address], [place], [place] [address]  Comprehensive metabolic panel     Status: Abnormal   Collection Time: 07/17/22  5:30 AM  Result Value Ref Range   Sodium 128 (L) 135 - 145 mmol/L   Potassium 3.3 (L) 3.5 - 5.1 mmol/L   Chloride 96 (L) 98 - 111 mmol/L   CO2 22 22 - 32 mmol/L   Glucose, Bld 147 (H) 70 - 99 mg/dL    Comment: Glucose reference range applies only to samples taken after fasting for at least 8 hours.   BUN 12 6 - 20 mg/dL   Creatinine, Ser 0.84 0.61 - 1.24 mg/dL   Calcium 9.7 8.9 - 10.3 mg/dL   Total Protein 6.9 6.5 - 8.1 g/dL   Albumin 2.4 (L) 3.5 - 5.0 g/dL   AST 38 15 - 41 U/L   ALT 15 0 - 44 U/L   Alkaline Phosphatase 79 38 - 126 U/L   Total Bilirubin 2.1 (H) 0.3 - 1.2 mg/dL   GFR, Estimated >60 >60 mL/min    Comment: (NOTE) Calculated using the CKD-EPI Creatinine Equation (2021)    Anion gap 10 5 - 15    Comment: Performed at [facility] [address] [place], [place] [address]  Magnesium     Status: Abnormal   Collection Time: 07/17/22  5:30 AM  Result Value Ref Range   Magnesium 1.3 (L) 1.7 - 2.4 mg/dL    Comment: Performed at [facility] [address], [place], [place] [address]  Phosphorus     Status: Abnormal   Collection Time: 07/17/22  5:30 AM  Result Value Ref Range   Phosphorus 2.4 (L) 2.5 - 4.6 mg/dL    Comment: Performed at [facility] [address] [place], [place] [address]  HIV Antibody (routine testing w rflx)     Status: None   Collection Time: 07/17/22  5:30 AM  Result Value Ref Range   HIV Screen 4th Generation wRfx Non Reactive Non Reactive    Comment: Performed at  [facility], [place] [address], [place], [place] [address]  CBC with Differential/Platelet     Status: Abnormal   Collection Time: 07/17/22  5:30 AM  Result Value Ref Range   WBC 35.4 (H) 4.0 - 10.5 K/uL   RBC 2.83 (L) 4.22 - 5.81 MIL/uL   Hemoglobin 10.6 (L) 13.0 - 17.0 g/dL   HCT 31.0 (L) 39.0 - 52.0 %   MCV 109.5 (H) 80.0 - 100.0 fL   MCH 37.5 (H) 26.0 - 34.0 pg   MCHC 34.2 30.0 - 36.0 g/dL   RDW 13.2 11.5 - 15.5 %   Platelets 499 (H) 150 - 400 K/uL   nRBC 0.1 0.0 - 0.2 %   Neutrophils Relative % 86 %   Neutro Abs 30.4 (H) 1.7 - 7.7 K/uL   Lymphocytes Relative 5 %   Lymphs Abs 1.8 0.7 - 4.0 K/uL   Monocytes Relative 9 %   Monocytes Absolute 3.2 (H) 0.1 - 1.0 K/uL   Eosinophils Relative 0 %   Eosinophils Absolute 0.0 0.0 - 0.5 K/uL   Basophils Relative 0 %   Basophils Absolute 0.0 0.0 - 0.1 K/uL   nRBC 1 (H) 0 /100 WBC   Abs Immature Granulocytes 0.00 0.00 - 0.07 K/uL  Polychromasia PRESENT     Comment: Performed at [facility], [place] [address], [place], [place] [address]  I-Stat arterial blood gas, ED [facility] ED, [facility], [facility])     Status: Abnormal   Collection Time: 07/17/22  6:45 AM  Result Value Ref Range   pH, Arterial 7.341 (L) 7.35 - 7.45   pCO2 arterial 46.6 32 - 48 mmHg   pO2, Arterial 74 (L) 83 - 108 mmHg   Bicarbonate 25.2 20.0 - 28.0 mmol/L   TCO2 27 22 - 32 mmol/L   O2 Saturation 94 %   Acid-base deficit 1.0 0.0 - 2.0 mmol/L   Sodium 127 (L) 135 - 145 mmol/L   Potassium 3.4 (L) 3.5 - 5.1 mmol/L   Calcium, Ion 1.38 1.15 - 1.40 mmol/L   HCT 37.0 (L) 39.0 - 52.0 %   Hemoglobin 12.6 (L) 13.0 - 17.0 g/dL   Patient temperature 37.0 C    Collection site RADIAL, ALLEN'S TEST ACCEPTABLE    Drawn by RT    Sample type ARTERIAL   CBG monitoring, ED     Status: Abnormal   Collection Time: 07/17/22  8:09 AM  Result Value Ref Range   Glucose-Capillary 115 (H) 70 - 99 mg/dL    Comment: Glucose reference range applies only to samples taken after fasting for at least  8 hours.  Renal function panel     Status: Abnormal   Collection Time: 07/17/22  9:00 AM  Result Value Ref Range   Sodium 129 (L) 135 - 145 mmol/L   Potassium 3.3 (L) 3.5 - 5.1 mmol/L   Chloride 97 (L) 98 - 111 mmol/L   CO2 24 22 - 32 mmol/L   Glucose, Bld 118 (H) 70 - 99 mg/dL    Comment: Glucose reference range applies only to samples taken after fasting for at least 8 hours.   BUN 10 6 - 20 mg/dL   Creatinine, Ser 0.71 0.61 - 1.24 mg/dL   Calcium 9.7 8.9 - 10.3 mg/dL   Phosphorus 2.2 (L) 2.5 - 4.6 mg/dL   Albumin 2.3 (L) 3.5 - 5.0 g/dL   GFR, Estimated >60 >60 mL/min    Comment: (NOTE) Calculated using the CKD-EPI Creatinine Equation (2021)    Anion gap 8 5 - 15    Comment: Performed at [facility] [address] [place], [place] [address]  CBC     Status: Abnormal   Collection Time: 07/17/22  9:00 AM  Result Value Ref Range   WBC 37.0 (H) 4.0 - 10.5 K/uL   RBC 2.78 (L) 4.22 - 5.81 MIL/uL   Hemoglobin 10.3 (L) 13.0 - 17.0 g/dL   HCT 30.3 (L) 39.0 - 52.0 %   MCV 109.0 (H) 80.0 - 100.0 fL   MCH 37.1 (H) 26.0 - 34.0 pg   MCHC 34.0 30.0 - 36.0 g/dL   RDW 13.2 11.5 - 15.5 %   Platelets 451 (H) 150 - 400 K/uL   nRBC 0.0 0.0 - 0.2 %    Comment: Performed at [facility] [address] [place], [place] [address]  Magnesium     Status: Abnormal   Collection Time: 07/17/22  9:00 AM  Result Value Ref Range   Magnesium 1.3 (L) 1.7 - 2.4 mg/dL    Comment: Performed at [facility] [address], [place], [place] [address]  Lactic acid, plasma     Status: None   Collection Time: 07/17/22  9:00 AM  Result  Value Ref Range   Lactic Acid, Venous 1.1 0.5 - 1.9 mmol/L    Comment: Performed at [facility] [address] [place], [place] [address]  Heparin level (unfractionated)     Status: Abnormal   Collection Time: 07/17/22  9:51 AM  Result Value Ref Range   Heparin Unfractionated <0.10 (L) 0.30 - 0.70 IU/mL    Comment: (NOTE) The clinical  reportable range upper limit is being lowered to >1.10 to align with the FDA approved guidance for the current laboratory assay.  If heparin results are below expected values, and patient dosage has  been confirmed, suggest follow up testing of antithrombin III levels. Performed at [facility], [place] [address], [place],  [address]    DG Chest Portable 1 View  Result Date: 07/17/2022 CLINICAL DATA:  Shortness of breath. EXAM: PORTABLE CHEST 1 VIEW COMPARISON:  Chest radiograph 1 day prior, same day CTA chest FINDINGS: The cardiomediastinal silhouette is stable. Multiple pulmonary masses are again seen, the largest measuring up to 9.8 cm in the left lung. Right middle lobe consolidation is also unchanged. There is no new or worsening focal airspace disease compared to the radiograph from 1 day prior. There is no pleural effusion or pneumothorax There is no acute osseous abnormality. IMPRESSION: Multiple pulmonary masses and right middle lobe consolidation are unchanged. No significant interval change since the radiograph from 1 day prior or same day CTA chest. Electronically Signed   By: Valetta Mole M.D.   On: 07/17/2022 13:09   CT Angio Chest PE W and/or Wo Contrast  Result Date: 07/17/2022 CLINICAL DATA:  Pulmonary embolism. EXAM: CT ANGIOGRAPHY CHEST WITH CONTRAST TECHNIQUE: Multidetector CT imaging of the chest was performed using the standard protocol during bolus administration of intravenous contrast. Multiplanar CT image reconstructions and MIPs were obtained to evaluate the vascular anatomy. RADIATION DOSE REDUCTION: This exam was performed according to the departmental dose-optimization program which includes automated exposure control, adjustment of the mA and/or kV according to patient size and/or use of iterative reconstruction technique. CONTRAST:  48m OMNIPAQUE IOHEXOL 350 MG/ML SOLN COMPARISON:  None Available. FINDINGS: Cardiovascular: There is adequate opacification  of the pulmonary arterial tree. There are multiple synechia and eccentric filling defects within the right middle and lower lobar segmental pulmonary arteries in keeping with chronic pulmonary embolism. No intraluminal filling defect identified to suggest acute pulmonary emboli. The central pulmonary arteries are mildly enlarged in keeping with changes of pulmonary arterial hypertension. Global cardiac size within normal limits. No pericardial effusion. The thoracic aorta is unremarkable. There is an intraluminal filling defect within the right superior pulmonary vein which may represent intraluminal thrombus or intravascular extension of tumor. Mediastinum/Nodes: Visualized thyroid is unremarkable. No pathologic thoracic adenopathy. The esophagus is unremarkable. Lungs/Pleura: There are innumerable pulmonary masses seen bilaterally most in keeping with widespread pulmonary metastatic disease. Dominant mass within the left upper lobe measures 7.9 cm in greatest dimension. There is superimposed tree-in-bud nodularity throughout the right lung, consolidation within the right middle lobe, and bronchial wall thickening and bronchiolectasis best appreciated within the right lower lobe. These findings can be seen the setting of superimposed acute multilobar infection or, less likely, endobronchial extension of disease. No pneumothorax or pleural effusion. Layering debris is seen within the distal trachea. Upper Abdomen: The liver contour is nodular in keeping with underlying cirrhosis. No acute abnormality. Musculoskeletal: No acute bone abnormality. No lytic or blastic bone lesion. Review of the MIP images confirms the above findings.  IMPRESSION: 1. 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 No acute pulmonary emboli identified. 2. Innumerable pulmonary masses bilaterally most in keeping with widespread pulmonary metastatic disease. 3. Superimposed tree-in-bud  nodularity throughout the right lung, consolidation within the right middle lobe, and bronchial wall thickening and bronchiolectasis best appreciated within the right lower lobe. These findings can be seen the setting of superimposed acute multilobar infection or, less likely, endobronchial extension of disease. 4. Intraluminal filling defect within the right superior pulmonary vein which may represent intraluminal thrombus or intravascular extension of tumor. 5. Morphologic changes in keeping with underlying pulmonary arterial hypertension. 6. Cirrhosis. Electronically Signed   By: Fidela Salisbury M.D.   On: 07/17/2022 01:16   DG Chest 2 View  Result Date: 07/16/2022 CLINICAL DATA:  Shortness of breath EXAM: CHEST - 2 VIEW COMPARISON:  04/21/2014 FINDINGS: Stable cardiomediastinal silhouette. Numerous bilateral nodular opacities measuring up to 2.8 cm on the right. Large subpleural mass measuring approximately 9.4 cm in the left midthorax. Hazy airspace opacity in the right lower lung. No pleural effusion or pneumothorax. No acute osseous abnormality. IMPRESSION: Large left mid lung subpleural mass. Numerous bilateral pulmonary nodules. Findings are concerning for metastases. Hazy opacity in the right lower lobe is nonspecific and may be neoplastic or infectious. These findings would be better evaluated with CT chest with IV contrast. Electronically Signed   By: Placido Sou M.D.   On: 07/16/2022 23:27    Pending Labs Unresulted Labs (From admission, onward)     Start     Ordered   07/18/22 0500  Heparin level (unfractionated)  Daily,   R      07/17/22 0157   07/18/22 0500  CBC  Daily,   R      07/17/22 0157   07/17/22 1700  Heparin level (unfractionated)  Once-Timed,   TIMED        07/17/22 1022   07/17/22 1036  Lactic acid, plasma  Now then every 2 hours,   R (with TIMED occurrences)      07/17/22 1035   07/17/22 1012  MRSA Next Gen by PCR, Nasal  (MRSA Screening)  Once,   R        07/17/22  1011   07/17/22 0249  Legionella Pneumophila Serogp 1 Ur Ag  Once,   R        07/17/22 0250   07/16/22 2258  Lactic acid, plasma  Now then every 2 hours,   R (with STAT occurrences)      07/16/22 2257   07/16/22 2258  Blood culture (routine x 2)  BLOOD CULTURE X 2,   R (with STAT occurrences)      07/16/22 2257   07/16/22 2258  Protime-INR  Once,   STAT        07/16/22 2257            Vitals/Pain Today's Vitals   07/17/22 1100 07/17/22 1130 07/17/22 1200 07/17/22 1219  BP: 108/66 123/62 135/72   Pulse: (!) 120 (!) 117 (!) 114   Resp: (!) 22  20   Temp:    100 F (37.8 C)  TempSrc:      SpO2: 97% 99% 99%   Weight:      Height:      PainSc:        Isolation Precautions Airborne and Contact precautions  Medications Medications  sodium chloride 0.9 % bolus 1,000 mL (0 mLs Intravenous Stopped 07/17/22 0601)  Followed by  sodium chloride 0.9 % bolus 1,000 mL (0 mLs Intravenous Stopped 07/17/22 0601)    Followed by  0.9 %  sodium chloride infusion (0 mLs Intravenous Hold 07/17/22 0600)  heparin ADULT infusion 100 units/mL (25000 units/265m) (1,800 Units/hr Intravenous Rate/Dose Change 07/17/22 1025)  LORazepam (ATIVAN) tablet 1-4 mg ( Oral See Alternative 07/17/22 1029)    Or  LORazepam (ATIVAN) injection 1-4 mg (2 mg Intravenous Given 07/17/22 1029)  thiamine (VITAMIN B1) tablet 100 mg (100 mg Oral Given 07/17/22 0531)    Or  thiamine (VITAMIN B1) injection 100 mg ( Intravenous See Alternative [id number]  folic acid (FOLVITE) tablet 1 mg (1 mg Oral Given 07/17/22 0545)  multivitamin with minerals tablet 1 tablet (1 tablet Oral Given 07/17/22 0532)  piperacillin-tazobactam (ZOSYN) IVPB 3.375 g (0 g Intravenous Stopped 07/17/22 1226)  oseltamivir (TAMIFLU) capsule 75 mg (75 mg Oral Given 07/17/22 0531)  acetaminophen (TYLENOL) suppository 325 mg (325 mg Rectal Given 07/17/22 0952)  vancomycin (VANCOREADY) IVPB 1250 mg/250 mL (has no administration in time range)   levalbuterol (XOPENEX) nebulizer solution 0.63 mg (has no administration in time range)  ipratropium (ATROVENT) nebulizer solution 0.5 mg (has no administration in time range)  budesonide (PULMICORT) nebulizer solution 0.25 mg (0.25 mg Nebulization Given 07/17/22 1302)  levalbuterol (XOPENEX) nebulizer solution 0.63 mg (has no administration in time range)  ipratropium (ATROVENT) nebulizer solution 0.5 mg (has no administration in time range)  acetaminophen (TYLENOL) tablet 650 mg (650 mg Oral Given 07/16/22 2313)  iohexol (OMNIPAQUE) 350 MG/ML injection 75 mL (75 mLs Intravenous Contrast Given 07/17/22 0025)  piperacillin-tazobactam (ZOSYN) IVPB 3.375 g (0 g Intravenous Stopped 07/17/22 0222)  vancomycin (VANCOREADY) IVPB 1500 mg/300 mL (0 mg Intravenous Stopped 07/17/22 0731)  heparin bolus via infusion 4,000 Units (4,000 Units Intravenous Bolus from Bag 07/17/22 0217)  metoprolol tartrate (LOPRESSOR) injection 2.5 mg (2.5 mg Intravenous Given 07/17/22 0606)  heparin bolus via infusion 2,700 Units (2,700 Units Intravenous Bolus from Bag 07/17/22 1025)    Mobility non-ambulatory Low fall risk   Focused Assessments Pulmonary Assessment Handoff:  Lung sounds: Bilateral Breath Sounds: Inspiratory wheezes L Breath Sounds: Pleural rub R Breath Sounds: Pleural rub O2 Device: Aerosol Mask O2 Flow Rate (L/min): 6 L/min    R Recommendations: See Admitting Provider Note  Report given to:   Additional Notes:

## 2022-07-17 NOTE — ED Notes (Signed)
Reported new tachycardia, increased work or breathing, and tachypnea to Dr.Hall via page.  Dr. Nevada Crane responds immediately to page, says she will come to bedside to assess further. See MAR for interventions .

## 2022-07-17 NOTE — Progress Notes (Signed)
ANTICOAGULATION CONSULT NOTE  Pharmacy Consult for Heparin Indication: pulmonary embolus  No Known Allergies  Patient Measurements: Height: '6\' 3"'$  (190.5 cm) Weight: 91.9 kg (202 lb 11.2 oz) IBW/kg (Calculated) : 84.5  Vital Signs: Temp: 98.5 F (36.9 C) (12/28 1730) Temp Source: Rectal (12/28 1007) BP: 97/72 (12/28 1730) Pulse Rate: 105 (12/28 1730)  Labs: Recent Labs    07/16/22 2300 07/16/22 2312 07/17/22 0050 07/17/22 0530 07/17/22 0645 07/17/22 0900 07/17/22 0951 07/17/22 1604 07/17/22 1610  HGB 11.7*   < >  --  10.6*   < > 10.3*  --  10.2* 9.8*  HCT 34.7*   < >  --  31.0*   < > 30.3*  --  30.0* 30.8*  PLT 595*  --   --  499*  --  451*  --   --  427*  HEPARINUNFRC  --   --   --   --   --   --  <0.10*  --  <0.10*  CREATININE 0.96   < >  --  0.84  --  0.71  --   --  0.61  TROPONINIHS 21*  --  23*  --   --   --   --   --   --    < > = values in this interval not displayed.     Estimated Creatinine Clearance: 145.2 mL/min (by C-G formula based on SCr of 0.61 mg/dL).   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 Hx of DVT in July of 2023. Pharmacy consulted to dose heparin. No anticoagulation PTA.  Heparin level undetectable s/p rate increase to 1800 units/hr, no infusion issues per RN  Goal of Therapy:  Heparin level 0.3-0.7 units/ml Monitor platelets by anticoagulation protocol: Yes   Plan:  Heparin 3000 units IV x 1, and increase rate to 2000 units/hr F/u 6 hour heparin level  Bertis Ruddy, PharmD, [place] Pharmacist ED Pharmacist Phone # [phone] 07/17/2022 6:04 PM

## 2022-07-17 NOTE — H&P (Signed)
NAME:  [name], MRN:  [id number], DOB:  [birth year], LOS: 0 ADMISSION DATE:  07/16/2022, CONSULTATION DATE: July 17, 2022 REFERRING MD: Dr. Francine Graven, CHIEF COMPLAINT: Dyspnea  History of Present Illness:  41 year old male with known right leg muscle mass and DVT presented to the emergency department on July 17, 2022 early in the morning complaining of shortness of breath and cough.  Was found to have influenza and pneumonia.  Has been treated with Tamiflu and broad-spectrum antibiotics for concern for bacterial superinfection.  Has been receiving oxygen, breathing treatments and has had some wheezing.  Developed progressive shortness of breath, mental status changes and became minimally responsive.  Pulmonary and critical care medicine was consulted for worsening respiratory failure and change in mental status.  On arrival the patient was unresponsive and unable to provide history.  History was obtained by talking to caregivers and reviewing the record.  Pertinent  Medical History  DVT diagnosed right leg July 2023 Right leg muscle mass diagnosed July 2023, referred to [facility] orthopedics, never followed up. Liver cirrhosis  Significant Hospital Events: Including procedures, antibiotic start and stop dates in addition to other pertinent events   July 17, 2022 CT angiogram chest images independently reviewed showing multiple large masses bilaterally, and filtrate, tree-in-bud abnormality and consolidation in the right middle lobe and right lower lobe, chronic thromboembolism noted, no changes consistent with acute thromboembolism, pulmonary vein filling defect worrisome for intravascular tumor, cirrhosis July 17, 2022 intubated, transferred to intensive care unit  Interim History / Subjective:  As above  Objective   Blood pressure 106/63, pulse (!) 122, temperature 100 F (37.8 C), resp. rate 16, height '6\' 3"'$  (1.905 m), weight 91.9 kg, SpO2 99 %.    Vent Mode:  PRVC FiO2 (%):  [100 %] 100 % Set Rate:  [16 bmp] 16 bmp Vt Set:  [450 mL] 450 mL PEEP:  [5 cmH20] 5 cmH20   Intake/Output Summary (Last 24 hours) at 07/17/2022 1537 Last data filed at 07/17/2022 1044 Gross per 24 hour  Intake 2100 ml  Output 20 ml  Net 2080 ml   Filed Weights   07/16/22 2213  Weight: 91.9 kg    Examination:  Gen: acutely ill appearing, increased work of breathing HENT: OP clear, neck supple PULM: Wheezing bilaterally B, increased effort  CV: Tachy, regular, no mgr GI: BS+, soft, nontender Derm: no cyanosis or rash Neuro: asleep, minimally responsive to external stimuli  Resolved Hospital Problem list     Assessment & Plan:  Acute respiratory failure with hypoxemia Influenza pneumonia Strep pneumo pneumonia Metastatic cancer, 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  All of this is complicated by multiple lung masses and chronic pulmonary thromboembolism.  Overall prognosis is poor.  Baseline social support status uncertain, no family available to provide more history  Plan: Admit to ICU Full ventilator support Daily wake-up assessment, spontaneous breathing trial Ceftriaxone, azithromycin Tamiflu Hydrocortisone 100 mg IV every 12 hours Start tube feeding PAD protocol, RASS score -2 with propofol and as needed fentanyl Heparin per pharmacy for DVT and chronic PE At some point we will need to pursue a tissue diagnosis,  at this time we will focus treatment on his acute infectious process and respiratory  failure   Best Practice (right click and "Reselect all SmartList Selections" daily)   Diet/type: tubefeeds DVT prophylaxis: systemic heparin GI prophylaxis: H2B Lines: N/A Foley:  N/A Code Status:  full code Last date of multidisciplinary goals of care discussion [Attempted to call his brother, no answer]  Labs   CBC: Recent Labs  Lab 07/16/22 2300 07/16/22 2312 07/17/22 0530 07/17/22 0645 07/17/22 0900  WBC 34.3*  --  35.4*  --  37.0*  NEUTROABS 29.5*  --  30.4*  --   --   HGB 11.7* 12.6* 10.6* 12.6* 10.3*  HCT 34.7* 37.0* 31.0* 37.0* 30.3*  MCV 109.1*  --  109.5*  --  109.0*  PLT 595*  --  499*  --  451*    Basic Metabolic Panel: Recent Labs  Lab 07/16/22 2300 07/16/22 2312 07/17/22 0530 07/17/22 0645 07/17/22 0900  NA 129* 128* 128* 127* 129*  K 4.0 4.0 3.3* 3.4* 3.3*  CL 92* 92* 96*  --  97*  CO2 26  --  22  --  24  GLUCOSE 129* 130* 147*  --  118*  BUN '14 14 12  '$ --  10  CREATININE 0.96 0.80 0.84  --  0.71  CALCIUM 11.1*  --  9.7  --  9.7  MG  --   --  1.3*  --  1.3*  PHOS  --   --  2.4*  --  2.2*   GFR: Estimated Creatinine Clearance: 145.2 mL/min (by C-G formula based on SCr of 0.71 mg/dL). Recent Labs  Lab 07/16/22 2300 07/17/22 0050 07/17/22 0530 07/17/22 0900  WBC 34.3*  --  35.4* 37.0*  LATICACIDVEN  --  1.8  --  1.1    Liver Function Tests: Recent Labs  Lab 07/17/22 0530 07/17/22 0900  AST 38  --   ALT 15  --   ALKPHOS 79  --   BILITOT 2.1*  --   PROT 6.9  --   ALBUMIN 2.4* 2.3*   No results for input(s): "LIPASE", "AMYLASE" in the last 168 hours. No results for input(s): "AMMONIA" in the last 168 hours.  ABG    Component Value Date/Time   PHART 7.341 (L) 07/17/2022 0645   PCO2ART 46.6 07/17/2022 0645   PO2ART 74 (L) 07/17/2022 0645   HCO3 25.2 07/17/2022 0645   TCO2 27 07/17/2022 0645   ACIDBASEDEF 1.0 07/17/2022 0645   O2SAT 94 07/17/2022 0645     Coagulation Profile: No results for input(s): "INR", "PROTIME" in the  last 168 hours.  Cardiac Enzymes: No results for input(s): "CKTOTAL", "CKMB", "CKMBINDEX", "TROPONINI" in the last 168 hours.  HbA1C: No results found for: "HGBA1C"  CBG: Recent Labs  Lab 07/17/22 0809  GLUCAP 115*    Review of Systems:   Could not obtain due to intubation  Past Medical History:  He,  has a past medical history of Epistaxis and Tooth infection.   Surgical History:   Past Surgical History:  Procedure Laterality Date   arm surgery       Social History:   reports that he has never smoked. He has never used smokeless tobacco. He reports current alcohol use. He reports that he does not use drugs.   Family History:  His family history is negative for Heart failure, Heart attack, and Fainting.   Allergies No Known Allergies   Home Medications  Prior to Admission medications  Medication Sig Start Date End Date Taking? Authorizing Provider  APIXABAN (ELIQUIS) VTE STARTER PACK ('10MG'$  AND '5MG'$ ) Take as directed on package: start with two-'5mg'$  tablets twice daily for 7 days. On day 8, switch to one-'5mg'$  tablet twice daily. 02/03/22   Sherwood Gambler, MD     Critical care time: 40 minutes    Roselie Awkward, MD [place] [facility] Pager: [phone] Cell: [phone] After 7:00 pm call Elink  [phone]

## 2022-07-17 NOTE — H&P (Signed)
PCP:   Patient, No Pcp Per   Chief Complaint:  Shortness of breath  HPI: This is a 41 year old male with no significant past medical history.  He was in the ER 01/1722 for a right lower extremity calf mass/growth.  He was diagnosed ultrasound/MRI done revealed extremity mass plus a DVT.  MRI reading concern for likely cancerous or musculoskeletal lesion.  As [name] does not have a specialist that would typically be involved in cancerous muscular lesions.  The patient and studies was discussed with Dr. Jefferson Fuel and orthopedics at [facility]. MRI was sent over electronically, patient discharged with hardcopy.  The patient was discharged with a consultation to orthopedics at [facility].  He was discharged on Eliquis.  In the interim patient has not followed up, he has not been taking his Eliquis.  3 days ago he developed trouble breathing.  It is severe enough that he is unable to sleep as he wakes up gasping for air.  Per patient he is now scared to go to sleep.  He has a cough that is productive of dark green yellow-green mucus.  He has had poor appetite for the past couple of months, no energy and has lost approximately 40 pounds.  He denies fevers.  He is a heavy drinker, consuming dark liquor almost 5 days weekly for the last couple of years.  He presents to the ER because of his shortness of breath.  In the ER images and chest x-ray/CTA chest shows postobstructive pneumonia, chronic pulmonary embolisms, and innumerable pulmonary masses consistent with widespread metastatic disease.  Patient WBC 34, Na 128, and influenza positive.  Additionally, patient hypoxic in the mid 80s on room air.  He is on 3 L oxygen.  Review of Systems:  The patient denies vision loss, decreased hearing, hoarseness, chest pain, syncope, peripheral edema, balance deficits, hemoptysis, abdominal pain, melena, hematochezia, severe indigestion/heartburn, hematuria, incontinence, genital sores, transient blindness,  difficulty walking, depression, abnormal bleeding, enlarged lymph nodes, angioedema, and breast masses. Positive: Anorexia, fever, weight loss, shortness of breath, productive cough  Past Medical History: Past Medical History:  Diagnosis Date   Epistaxis    Tooth infection    Past Surgical History:  Procedure Laterality Date   arm surgery      Medications: Prior to Admission medications   Medication Sig Start Date End Date Taking? Authorizing Provider  APIXABAN (ELIQUIS) VTE STARTER PACK ('10MG'$  AND '5MG'$ ) Take as directed on package: start with two-'5mg'$  tablets twice daily for 7 days. On day 8, switch to one-'5mg'$  tablet twice daily. 02/03/22   Sherwood Gambler, MD    Allergies:  No Known Allergies  Social History:  reports that he has never smoked. He has never used smokeless tobacco. He reports current alcohol use. He reports that he does not use drugs.  Family History: Family History  Problem Relation Age of Onset   Heart failure Neg Hx    Heart attack Neg Hx    Fainting Neg Hx     Physical Exam: Vitals:   07/16/22 2254 07/17/22 0053 07/17/22 0100 07/17/22 0145  BP:  127/74 118/77 119/71  Pulse:  (!) 124 (!) 115 (!) 116  Resp:  18 (!) 23 20  Temp: (!) 102.9 F (39.4 C)     TempSrc: Oral     SpO2:  95% 95% 94%  Weight:      Height:        General:  Alert and oriented times three, well developed and nourished, ill appearing,  dehydrated appearing male Eyes: PERRLA, pink conjunctiva, no scleral icterus ENT: dry oral mucosa, neck supple, no thyromegaly Lungs: clear to ascultation, no wheeze, no crackles, no use of accessory muscles Cardiovascular: regular rate and rhythm, no regurgitation, no gallops, no murmurs. No carotid bruits, no JVD Abdomen: soft, positive BS, non-tender, non-distended, no organomegaly, not an acute abdomen GU: not examined Neuro: CN II - XII grossly intact, sensation intact Musculoskeletal: strength 5/5 all extremities, no clubbing, cyanosis. RLE  calf mass Skin: no rash, no subcutaneous crepitation, no decubitus Psych: Weak but appropriate patient   Labs on Admission:  Recent Labs    07/16/22 2300 07/16/22 2312  NA 129* 128*  K 4.0 4.0  CL 92* 92*  CO2 26  --   GLUCOSE 129* 130*  BUN 14 14  CREATININE 0.96 0.80  CALCIUM 11.1*  --     Recent Labs    07/16/22 2300 07/16/22 2312  WBC 34.3*  --   NEUTROABS 29.5*  --   HGB 11.7* 12.6*  HCT 34.7* 37.0*  MCV 109.1*  --   PLT 595*  --     Radiological Exams on Admission: CT Angio Chest PE W and/or Wo Contrast  Result Date: 07/17/2022 CLINICAL DATA:  Pulmonary embolism. EXAM: CT ANGIOGRAPHY CHEST WITH CONTRAST TECHNIQUE: Multidetector CT imaging of the chest was performed using the standard protocol during bolus administration of intravenous contrast. Multiplanar CT image reconstructions and MIPs were obtained to evaluate the vascular anatomy. RADIATION DOSE REDUCTION: This exam was performed according to the departmental dose-optimization program which includes automated exposure control, adjustment of the mA and/or kV according to patient size and/or use of iterative reconstruction technique. CONTRAST:  57m OMNIPAQUE IOHEXOL 350 MG/ML SOLN COMPARISON:  None Available. FINDINGS: Cardiovascular: There is adequate opacification of the pulmonary arterial tree. There are multiple synechia and eccentric filling defects within the right middle and lower lobar segmental pulmonary arteries in keeping with chronic pulmonary embolism. No intraluminal filling defect identified to suggest acute pulmonary emboli. The central pulmonary arteries are mildly enlarged in keeping with changes of pulmonary arterial hypertension. Global cardiac size within normal limits. No pericardial effusion. The thoracic aorta is unremarkable. There is an intraluminal filling defect within the right superior pulmonary vein which may represent intraluminal thrombus or intravascular extension of tumor.  Mediastinum/Nodes: Visualized thyroid is unremarkable. No pathologic thoracic adenopathy. The esophagus is unremarkable. Lungs/Pleura: There are innumerable pulmonary masses seen bilaterally most in keeping with widespread pulmonary metastatic disease. Dominant mass within the left upper lobe measures 7.9 cm in greatest dimension. There is superimposed tree-in-bud nodularity throughout the right lung, consolidation within the right middle lobe, and bronchial wall thickening and bronchiolectasis best appreciated within the right lower lobe. These findings can be seen the setting of superimposed acute multilobar infection or, less likely, endobronchial extension of disease. No pneumothorax or pleural effusion. Layering debris is seen within the distal trachea. Upper Abdomen: The liver contour is nodular in keeping with underlying cirrhosis. No acute abnormality. Musculoskeletal: No acute bone abnormality. No lytic or blastic bone lesion. Review of the MIP images confirms the above findings. IMPRESSION: 1. 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 No acute pulmonary emboli identified. 2. Innumerable pulmonary masses bilaterally most in keeping with widespread pulmonary metastatic disease. 3. Superimposed tree-in-bud nodularity throughout the right lung, consolidation within the right middle lobe, and bronchial wall thickening and bronchiolectasis best appreciated within the right lower lobe. These findings can be seen the setting  of superimposed acute multilobar infection or, less likely, endobronchial extension of disease. 4. Intraluminal filling defect within the right superior pulmonary vein which may represent intraluminal thrombus or intravascular extension of tumor. 5. Morphologic changes in keeping with underlying pulmonary arterial hypertension. 6. Cirrhosis. Electronically Signed   By: Fidela Salisbury M.D.   On: 07/17/2022 01:16   DG Chest 2  View  Result Date: 07/16/2022 CLINICAL DATA:  Shortness of breath EXAM: CHEST - 2 VIEW COMPARISON:  04/21/2014 FINDINGS: Stable cardiomediastinal silhouette. Numerous bilateral nodular opacities measuring up to 2.8 cm on the right. Large subpleural mass measuring approximately 9.4 cm in the left midthorax. Hazy airspace opacity in the right lower lung. No pleural effusion or pneumothorax. No acute osseous abnormality. IMPRESSION: Large left mid lung subpleural mass. Numerous bilateral pulmonary nodules. Findings are concerning for metastases. Hazy opacity in the right lower lobe is nonspecific and may be neoplastic or infectious. These findings would be better evaluated with CT chest with IV contrast. Electronically Signed   By: Placido Sou M.D.   On: 07/16/2022 23:27    Assessment/Plan Present on Admission:  Influenza A/postobstructive pneumonia Acute respiratory failure on 3 L -Admit to progressive care -Pneumonia order set initiated -Blood cultures x 2 collected -Zosyn, vancomycin and fluids initiated -Tamiflu p.o. 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

## 2022-07-17 NOTE — Progress Notes (Signed)
ANTICOAGULATION CONSULT NOTE  Pharmacy Consult for Heparin Indication: pulmonary embolus  No Known Allergies  Patient Measurements: Height: '6\' 3"'$  (190.5 cm) Weight: 91.9 kg (202 lb 11.2 oz) IBW/kg (Calculated) : 84.5  Vital Signs: Temp: 101.6 F (38.7 C) (12/28 1007) Temp Source: Rectal (12/28 1007) BP: 125/66 (12/28 0930) Pulse Rate: 133 (12/28 0930)  Labs: Recent Labs    07/16/22 2300 07/16/22 2312 07/17/22 0050 07/17/22 0530 07/17/22 0645 07/17/22 0900 07/17/22 0951  HGB 11.7* 12.6*  --  10.6* 12.6* 10.3*  --   HCT 34.7* 37.0*  --  31.0* 37.0* 30.3*  --   PLT 595*  --   --  499*  --  451*  --   HEPARINUNFRC  --   --   --   --   --   --  <0.10*  CREATININE 0.96 0.80  --  0.84  --  0.71  --   TROPONINIHS 21*  --  23*  --   --   --   --      Estimated Creatinine Clearance: 145.2 mL/min (by C-G formula based on SCr of 0.71 mg/dL).   Medical History: Past Medical History:  Diagnosis Date   Epistaxis    Tooth infection     Assessment: 68 YOM presenting with SOB found to have chronic PE. Hx of DVT in July of 2023. Pharmacy consulted to dose heparin. No anticoagulation PTA.  Heparin level undetectable on heparin 1600 units/hr. No signs of bleeding. CBC stable.  Goal of Therapy:  Heparin level 0.3-0.7 units/ml Monitor platelets by anticoagulation protocol: Yes   Plan:  Heparin 2700 units IV bolus once Increase heparin to 1800 units/hr Check heparin level in 6 hours.  F/u plans to switch to [place], PharmD Clinical Pharmacist 07/17/2022,10:19 AM

## 2022-07-17 NOTE — Progress Notes (Signed)
Brief progress note: -Patient was admitted earlier today. -As per H&P done on admission: " This is a 41 year old male with no significant past medical history.  He was in the ER 01/1722 for a right lower extremity calf mass/growth.  He was diagnosed ultrasound/MRI done revealed extremity mass plus a DVT.  MRI reading concern for likely cancerous or musculoskeletal lesion.  As [facility] does not have a specialist that would typically be involved in cancerous muscular lesions.  The patient and studies was discussed with Dr. Jefferson Fuel and orthopedics at [facility]. MRI was sent over electronically, patient discharged with hardcopy.  The patient was discharged with a consultation to orthopedics at [facility].  He was discharged on Eliquis.  In the interim patient has not followed up, he has not been taking his Eliquis.   3 days ago he developed trouble breathing.  It is severe enough that he is unable to sleep as he wakes up gasping for air.  Per patient he is now scared to go to sleep.  He has a cough that is productive of dark green yellow-green mucus.  He has had poor appetite for the past couple of months, no energy and has lost approximately 40 pounds.  He denies fevers.   He is a heavy drinker, consuming dark liquor almost 5 days weekly for the last couple of years.  He presents to the ER because of his shortness of breath.   In the ER images and chest x-ray/CTA chest shows postobstructive pneumonia, chronic pulmonary embolisms, and innumerable pulmonary masses consistent with widespread metastatic disease.  Patient WBC 34, Na 128, and influenza positive.  Additionally, patient hypoxic in the mid 80s on room air.  He is on 3 L oxygen".  07/17/2022: Patient seen alongside patient's nurse.  Patient is very ill looking.  Patient is in respiratory distress.  Patient is tachypneic.  Discussed with ICU.  ICU team will see patient and assume care.

## 2022-07-17 NOTE — Code Documentation (Signed)
PREPARING FOR INTUBATION AT THIS TIME

## 2022-07-17 NOTE — Progress Notes (Addendum)
Received a call from bedside RN regarding tachycardia with HR in the 140's.  Presented at bedside.  The patient is somnolent but arouses to voices.  Follows commands.  Personally reviewed CT chest PE.  Has chronic PE and multiple lung masses which likely contribute to his tachycardia.  Hypoxic and requiring 4L  to maintain O2 sat above 90%.  2.5 mg IV Lopressor administered.  ABG ordered to further assess.

## 2022-07-17 NOTE — ED Notes (Signed)
RT called and notified of new ABG orders

## 2022-07-17 NOTE — Progress Notes (Addendum)
eLink Physician-Brief Progress Note Patient Name: [name] DOB: [birth year] MRN: [id number]   Date of Service  07/17/2022  HPI/Events of Note  Patient's brother [name] requesting update on his clinical status  eICU Interventions  Called brother and it went to voicemail, I left a message to call me back at [phone]. ADDENDUM Patient's brother called back and I updated him.        Kerry Kass Cerenity Goshorn 07/17/2022, 9:35 PM

## 2022-07-17 NOTE — Sepsis Progress Note (Signed)
Elink monitoring for the code sepsis protocol.  

## 2022-07-17 NOTE — Procedures (Signed)
Intubation Procedure Note  [name]  [id number]  [birth year]  Date:07/17/22  Time:3:36 PM   Provider Performing:Brent Mela Perham    Procedure: Intubation (31500)  Indication(s) Respiratory Failure  Consent Unable to obtain consent due to emergent nature of procedure.   Anesthesia Etomidate, Versed, Fentanyl, and Rocuronium   Time Out Verified patient identification, verified procedure, site/side was marked, verified correct patient position, special equipment/implants available, medications/allergies/relevant history reviewed, required imaging and test results available.   Sterile Technique Usual hand hygeine, masks, and gloves were used   Procedure Description Patient positioned in bed supine.  Sedation given as noted above.  Patient was intubated with endotracheal tube using Glidescope.  View was Grade 1 full glottis .  Number of attempts was 1.  Colorimetric CO2 detector was consistent with tracheal placement.   Complications/Tolerance None; patient tolerated the procedure well. Chest X-ray is ordered to verify placement.   EBL Minimal   Specimen(s) None  Roselie Awkward, MD [place] [facility] Pager: [phone] Cell: [phone] After 7:00 pm call Elink  [phone]

## 2022-07-18 ENCOUNTER — Inpatient Hospital Stay (HOSPITAL_COMMUNITY): Payer: BC Managed Care – PPO

## 2022-07-18 ENCOUNTER — Other Ambulatory Visit (HOSPITAL_COMMUNITY): Payer: BC Managed Care – PPO

## 2022-07-18 DIAGNOSIS — R9431 Abnormal electrocardiogram [ECG] [EKG]: Secondary | ICD-10-CM

## 2022-07-18 DIAGNOSIS — J9601 Acute respiratory failure with hypoxia: Secondary | ICD-10-CM | POA: Diagnosis not present

## 2022-07-18 DIAGNOSIS — I301 Infective pericarditis: Secondary | ICD-10-CM | POA: Diagnosis not present

## 2022-07-18 DIAGNOSIS — G9341 Metabolic encephalopathy: Secondary | ICD-10-CM | POA: Diagnosis not present

## 2022-07-18 DIAGNOSIS — A419 Sepsis, unspecified organism: Secondary | ICD-10-CM | POA: Diagnosis not present

## 2022-07-18 DIAGNOSIS — Z1152 Encounter for screening for COVID-19: Secondary | ICD-10-CM | POA: Diagnosis not present

## 2022-07-18 DIAGNOSIS — Z515 Encounter for palliative care: Secondary | ICD-10-CM | POA: Diagnosis not present

## 2022-07-18 DIAGNOSIS — R6521 Severe sepsis with septic shock: Secondary | ICD-10-CM

## 2022-07-18 DIAGNOSIS — I2782 Chronic pulmonary embolism: Secondary | ICD-10-CM | POA: Diagnosis not present

## 2022-07-18 DIAGNOSIS — J189 Pneumonia, unspecified organism: Secondary | ICD-10-CM | POA: Diagnosis not present

## 2022-07-18 LAB — MAGNESIUM
Magnesium: 1.5 mg/dL — ABNORMAL LOW (ref 1.7–2.4)
Magnesium: 2.3 mg/dL (ref 1.7–2.4)

## 2022-07-18 LAB — HEMOGLOBIN A1C
Hgb A1c MFr Bld: 4.6 % — ABNORMAL LOW (ref 4.8–5.6)
Mean Plasma Glucose: 85 mg/dL

## 2022-07-18 LAB — CBC
HCT: 28.3 % — ABNORMAL LOW (ref 39.0–52.0)
Hemoglobin: 10 g/dL — ABNORMAL LOW (ref 13.0–17.0)
MCH: 37.9 pg — ABNORMAL HIGH (ref 26.0–34.0)
MCHC: 35.3 g/dL (ref 30.0–36.0)
MCV: 107.2 fL — ABNORMAL HIGH (ref 80.0–100.0)
Platelets: 480 10*3/uL — ABNORMAL HIGH (ref 150–400)
RBC: 2.64 MIL/uL — ABNORMAL LOW (ref 4.22–5.81)
RDW: 13.3 % (ref 11.5–15.5)
WBC: 51.1 10*3/uL (ref 4.0–10.5)
nRBC: 0 % (ref 0.0–0.2)

## 2022-07-18 LAB — GLUCOSE, CAPILLARY
Glucose-Capillary: 143 mg/dL — ABNORMAL HIGH (ref 70–99)
Glucose-Capillary: 146 mg/dL — ABNORMAL HIGH (ref 70–99)
Glucose-Capillary: 153 mg/dL — ABNORMAL HIGH (ref 70–99)
Glucose-Capillary: 172 mg/dL — ABNORMAL HIGH (ref 70–99)
Glucose-Capillary: 187 mg/dL — ABNORMAL HIGH (ref 70–99)
Glucose-Capillary: 189 mg/dL — ABNORMAL HIGH (ref 70–99)
Glucose-Capillary: 88 mg/dL (ref 70–99)

## 2022-07-18 LAB — BASIC METABOLIC PANEL
Anion gap: 7 (ref 5–15)
BUN: 15 mg/dL (ref 6–20)
CO2: 25 mmol/L (ref 22–32)
Calcium: 10.2 mg/dL (ref 8.9–10.3)
Chloride: 102 mmol/L (ref 98–111)
Creatinine, Ser: 0.66 mg/dL (ref 0.61–1.24)
GFR, Estimated: >60 mL/min (ref >60)
Glucose, Bld: 147 mg/dL — ABNORMAL HIGH (ref 70–99)
Potassium: 3.7 mmol/L (ref 3.5–5.1)
Sodium: 134 mmol/L — ABNORMAL LOW (ref 135–145)

## 2022-07-18 LAB — TROPONIN I (HIGH SENSITIVITY)
Troponin I (High Sensitivity): 16 ng/L (ref <18)
Troponin I (High Sensitivity): 9 ng/L (ref <18)

## 2022-07-18 LAB — ECHOCARDIOGRAM COMPLETE
AR max vel: 3.64 cm2
AV Area VTI: 3.39 cm2
AV Area mean vel: 3.41 cm2
AV Mean grad: 3 mmHg
AV Peak grad: 5.2 mmHg
Ao pk vel: 1.14 m/s
Area-P 1/2: 2.63 cm2
Calc EF: 47.3 %
Height: 75 in
S' Lateral: 4.4 cm
Single Plane A2C EF: 44.8 %
Single Plane A4C EF: 52.7 %
Weight: 3216.95 oz

## 2022-07-18 LAB — TRIGLYCERIDES: Triglycerides: 109 mg/dL (ref <150)

## 2022-07-18 LAB — C-REACTIVE PROTEIN: CRP: 9.1 mg/dL — ABNORMAL HIGH (ref <1.0)

## 2022-07-18 LAB — PHOSPHORUS
Phosphorus: 2.7 mg/dL (ref 2.5–4.6)
Phosphorus: 1.7 mg/dL — ABNORMAL LOW (ref 2.5–4.6)

## 2022-07-18 LAB — HEPARIN LEVEL (UNFRACTIONATED)
Heparin Unfractionated: 0.12 IU/mL — ABNORMAL LOW (ref 0.30–0.70)
Heparin Unfractionated: 0.3 IU/mL (ref 0.30–0.70)

## 2022-07-18 LAB — SEDIMENTATION RATE: Sed Rate: 114 mm/hr — ABNORMAL HIGH (ref 0–16)

## 2022-07-18 MED ORDER — ASPIRIN 81 MG PO CHEW
81.0000 mg | CHEWABLE_TABLET | Freq: Every day | ORAL | Status: DC
  Administered 2022-07-19: 81 mg
  Filled 2022-07-18: qty 1

## 2022-07-18 MED ORDER — ASPIRIN 81 MG PO CHEW: 81.0000 mg | CHEWABLE_TABLET | Freq: Every day | ORAL | Status: DC

## 2022-07-18 MED ORDER — COLCHICINE 0.6 MG PO TABS
0.6000 mg | ORAL_TABLET | Freq: Two times a day (BID) | ORAL | Status: DC
  Filled 2022-07-18: qty 1

## 2022-07-18 MED ORDER — ENOXAPARIN SODIUM 100 MG/ML IJ SOSY
90.0000 mg | PREFILLED_SYRINGE | Freq: Two times a day (BID) | INTRAMUSCULAR | Status: DC
  Administered 2022-07-18 – 2022-07-21 (×8): 90 mg via SUBCUTANEOUS
  Filled 2022-07-18: qty 1
  Filled 2022-07-18 (×2): qty 0.9
  Filled 2022-07-18: qty 1
  Filled 2022-07-18 (×2): qty 0.9
  Filled 2022-07-18 (×2): qty 1

## 2022-07-18 MED ORDER — SODIUM CHLORIDE 0.9 % IV SOLN: 3.0000 g | Freq: Four times a day (QID) | INTRAVENOUS | Status: DC

## 2022-07-18 MED ORDER — THIAMINE MONONITRATE 100 MG PO TABS
100.0000 mg | ORAL_TABLET | Freq: Every day | ORAL | Status: DC
  Administered 2022-07-18 – 2022-07-19 (×2): 100 mg
  Filled 2022-07-18 (×2): qty 1

## 2022-07-18 MED ORDER — IOHEXOL 9 MG/ML PO SOLN
500.0000 mL | ORAL | Status: AC
  Administered 2022-07-18 (×2): 500 mL via ORAL

## 2022-07-18 MED ORDER — OSELTAMIVIR PHOSPHATE 6 MG/ML PO SUSR
75.0000 mg | Freq: Two times a day (BID) | ORAL | Status: DC
  Administered 2022-07-18 – 2022-07-19 (×4): 75 mg
  Filled 2022-07-18 (×5): qty 12.5

## 2022-07-18 MED ORDER — ASPIRIN 81 MG PO TBEC: 81.0000 mg | DELAYED_RELEASE_TABLET | Freq: Every day | ORAL | Status: DC

## 2022-07-18 MED ORDER — THIAMINE HCL 100 MG/ML IJ SOLN: 100.0000 mg | Freq: Every day | INTRAMUSCULAR | Status: DC

## 2022-07-18 MED ORDER — POTASSIUM CHLORIDE 20 MEQ PO PACK
40.0000 meq | PACK | Freq: Once | ORAL | Status: AC
  Administered 2022-07-18: 40 meq
  Filled 2022-07-18: qty 2

## 2022-07-18 MED ORDER — POLYETHYLENE GLYCOL 3350 17 G PO PACK: 17.0000 g | PACK | Freq: Every day | ORAL | Status: DC | PRN

## 2022-07-18 MED ORDER — MAGNESIUM SULFATE 4 GM/100ML IV SOLN
4.0000 g | Freq: Once | INTRAVENOUS | Status: AC
  Administered 2022-07-18: 4 g via INTRAVENOUS
  Filled 2022-07-18: qty 100

## 2022-07-18 MED ORDER — HEPARIN BOLUS VIA INFUSION
3000.0000 [IU] | Freq: Once | INTRAVENOUS | Status: AC
  Administered 2022-07-18: 3000 [IU] via INTRAVENOUS
  Filled 2022-07-18: qty 3000

## 2022-07-18 MED ORDER — ASPIRIN 325 MG PO TABS
325.0000 mg | ORAL_TABLET | Freq: Once | ORAL | Status: AC
  Administered 2022-07-18: 325 mg via ORAL
  Filled 2022-07-18: qty 1

## 2022-07-18 MED ORDER — ADULT MULTIVITAMIN W/MINERALS CH
1.0000 | ORAL_TABLET | Freq: Every day | ORAL | Status: DC
  Administered 2022-07-18 – 2022-07-19 (×2): 1
  Filled 2022-07-18 (×2): qty 1

## 2022-07-18 MED ORDER — FOLIC ACID 1 MG PO TABS
1.0000 mg | ORAL_TABLET | Freq: Every day | ORAL | Status: DC
  Administered 2022-07-18 – 2022-07-19 (×2): 1 mg
  Filled 2022-07-18 (×2): qty 1

## 2022-07-18 MED ORDER — COLCHICINE 0.6 MG PO TABS
0.6000 mg | ORAL_TABLET | Freq: Two times a day (BID) | ORAL | Status: DC
  Administered 2022-07-18 – 2022-07-19 (×4): 0.6 mg
  Filled 2022-07-18 (×5): qty 1

## 2022-07-18 MED ORDER — LORAZEPAM 2 MG/ML IJ SOLN: 1.0000 mg | INTRAMUSCULAR | Status: DC | PRN

## 2022-07-18 MED ORDER — VITAL 1.5 CAL PO LIQD
1000.0000 mL | ORAL | Status: DC
  Administered 2022-07-18 – 2022-07-19 (×2): 1000 mL
  Filled 2022-07-18 (×2): qty 1000

## 2022-07-18 MED ORDER — SODIUM CHLORIDE 0.9 % IV SOLN
3.0000 g | Freq: Four times a day (QID) | INTRAVENOUS | Status: AC
  Administered 2022-07-18 – 2022-07-22 (×18): 3 g via INTRAVENOUS
  Filled 2022-07-18 (×18): qty 8

## 2022-07-18 MED ORDER — LORAZEPAM 1 MG PO TABS: 1.0000 mg | ORAL_TABLET | ORAL | Status: DC | PRN

## 2022-07-18 NOTE — Progress Notes (Addendum)
   NAME:  [name], MRN:  [id number], DOB:  Apr 20, 1981, LOS: 1 ADMISSION DATE:  07/16/2022, CONSULTATION DATE: July 17, 2022 REFERRING MD: Dr. Francine Graven, CHIEF COMPLAINT: Dyspnea  History of Present Illness:  41 year old male with known right leg muscle mass and DVT presented to the emergency department on July 17, 2022 early in the morning complaining of shortness of breath and cough.  Was found to have influenza and pneumonia.  Has been treated with Tamiflu and broad-spectrum antibiotics for concern for bacterial superinfection.  Has been receiving oxygen, breathing treatments and has had some wheezing.  Developed progressive shortness of breath, mental status changes and became minimally responsive.  Pulmonary and critical care medicine was consulted for worsening respiratory failure and change in mental status.  On arrival the patient was unresponsive and unable to provide history.  History was obtained by talking to caregivers and reviewing the record.  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 Liver cirrhosis  Significant Hospital Events: Including procedures, antibiotic start and stop dates in addition to other pertinent events   July 17, 2022 CT angiogram chest images independently reviewed showing multiple large masses bilaterally, and filtrate, tree-in-bud abnormality and consolidation in the right middle lobe and right lower lobe, chronic thromboembolism noted, no changes consistent with acute thromboembolism, pulmonary vein filling defect worrisome for intravascular tumor, cirrhosis July 17, 2022 intubated, transferred to intensive care unit  Interim History / Subjective:  No events. On vent.  Objective   Blood pressure 111/62, pulse 77, temperature (!) 97.5 F (36.4 C), temperature source Bladder, resp. rate 17, height '6\' 3"'$  (1.905 m),  weight 91.2 kg, SpO2 100 %.    Vent Mode: PRVC FiO2 (%):  [60 %-100 %] 60 % Set Rate:  [16 bmp-24 bmp] 24 bmp Vt Set:  [450 mL-670 mL] 670 mL PEEP:  [5 cmH20] 5 cmH20 Plateau Pressure:  [21 cmH20] 21 cmH20   Intake/Output Summary (Last 24 hours) at 07/18/2022 2563 Last data filed at 07/18/2022 0900 Gross per 24 hour  Intake 1517.67 ml  Output 1470 ml  Net 47.67 ml    Filed Weights   07/16/22 2213 07/17/22 1832 07/18/22 0424  Weight: 91.9 kg 90.7 kg 91.2 kg    Examination: Ill appearing man diaphoretic on vent Wheezing worse on R Tumor in leg noted Triggers vent  WBC up question steroid effect Cr okay  Resolved Hospital Problem list     Assessment & Plan:  Acute respiratory failure with hypoxemia Influenza pneumonia ?Strep pneumo pneumonia vs. 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 Audie Box, no WMA, looks more c/w either chronic RV dysfunction or pericarditis  Even with biopsy and treatment I worry his cancer is terminal.  Brother to come discuss next steps  Abx to unasyn  AC to lovenox  Continue vent support  Pressors are weaning, dc stress steroids  Grim prognosis  Thiamine/folate/MVN  Best Practice (right click and "Reselect all SmartList Selections" daily)   Diet/type: tubefeeds DVT prophylaxis: lovenox GI prophylaxis: H2B Lines: N/A Foley:  N/A Code Status:  full code Last date of multidisciplinary goals of care discussion [today]  31 min cc time Erskine Emery MD PCCM

## 2022-07-18 NOTE — Progress Notes (Signed)
Reviewed imaging with brother and ex wife at bedside.  Family thinks Daemien would want treatment.  Will see if we can wean from vent over next day or two and set him up for biopsy early next week if he is agreeable.  Erskine Emery MD PCCM

## 2022-07-18 NOTE — Consult Note (Signed)
Cardiology Consultation:  Patient ID: [name] MRN: [id number]; DOB: [birth year]  Admit date: 07/16/2022 Date of Consult: 07/18/2022  Primary Care Provider: Patient, No Pcp Per Primary Cardiologist: None  Primary Electrophysiologist:  None   Patient Profile:  [name] is a 41 y.o. 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  History of Present Illness:  Mr. [name] is currently admitted to the ICU with acute hypoxic respiratory failure secondary to influenza A and pneumonia.  He has metastatic cancer which has not been fully diagnosed.  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  Currently intubated in the ICU with septic shock secondary to influenza A and pneumonia.  Currently on pressors as well as heparin for known chronic pulmonary emboli.  Cardiology was consulted this morning due to abnormal EKG.  His EKG demonstrates diffuse ST elevation.  There are no reciprocal ST depressions that I can see.  A stat echocardiogram was performed this morning.  That demonstrates LVEF 55% with no regional wall motion abnormalities.  CTA of the chest was reviewed.  This shows no evidence of coronary calcium.  Highly suspect this is related to possible pericarditis.  Laboratory data notable for normal serum creatinine 0.66.  Magnesium is low.  Would replace this.  Significant leukocytosis at 51,000.  Hemoglobin 10.  Platelets 480.  High-sensitivity troponin is pending.  The patient is currently intubated and critically ill.  He cannot provide any history.  His brother was at the bedside.  He seems to be unaware of his diagnosis of cancer.  Heart Pathway Score:       Past Medical History: Past Medical History:  Diagnosis Date   Epistaxis    Tooth infection      Past Surgical History: Past Surgical History:  Procedure Laterality Date   arm surgery       Home Medications:  Prior to Admission medications   Medication Sig Start Date End Date Taking? Authorizing Provider  APIXABAN (ELIQUIS) VTE STARTER PACK (10MG AND 5MG) Take as directed on package: start with two-44m tablets twice daily for 7 days. On day 8, switch to one-56mtablet twice daily. 02/03/22   GoSherwood GamblerMD    Inpatient Medications: Scheduled Meds:  [START ON 07/19/2022] aspirin EC  81 mg Oral Daily   budesonide (PULMICORT) nebulizer solution  0.25 mg Nebulization BID   Chlorhexidine Gluconate Cloth  6 each Topical Daily   famotidine  20 mg Per Tube BID   feeding supplement (PROSource TF20)  60 mL Per Tube Daily   feeding supplement (VITAL HIGH PROTEIN)  1,000 mL Per Tube Q24H   fentaNYL (SUBLIMAZE) injection  50 mcg Intravenous Once   folic acid  1 mg Oral Daily   insulin aspart  0-9 Units Subcutaneous Q4H   ipratropium  0.5 mg Nebulization Q8H   levalbuterol  0.63 mg Nebulization Q8H   methylPREDNISolone (SOLU-MEDROL) injection  100 mg Intravenous Q12H   multivitamin with minerals  1 tablet Oral Daily   mouth rinse  15 mL Mouth Rinse Q2H   oseltamivir  75 mg Oral BID   thiamine  100 mg Oral Daily   Or   thiamine  100 mg Intravenous Daily   Continuous Infusions:  sodium chloride Stopped (07/17/22 0600)   sodium chloride  azithromycin Stopped (07/17/22 1805)   cefTRIAXone (ROCEPHIN)  IV Stopped (07/17/22 1653)   fentaNYL infusion INTRAVENOUS 150 mcg/hr (07/18/22 0600)   heparin 2,350 Units/hr (07/18/22 0211)   norepinephrine (LEVOPHED) Adult infusion 2 mcg/min (07/18/22 0600)   propofol (DIPRIVAN) infusion 40 mcg/kg/min (07/18/22 0649)   PRN Meds: acetaminophen, docusate sodium, fentaNYL, ipratropium, levalbuterol, LORazepam **OR** LORazepam, mouth rinse, polyethylene glycol  Allergies:    No Known Allergies  Social History:   Social History    Socioeconomic History   Marital status: Single    Spouse name: Not on file   Number of children: Not on file   Years of education: Not on file   Highest education level: Not on file  Occupational History   Not on file  Tobacco Use   Smoking status: Never   Smokeless tobacco: Never  Substance and Sexual Activity   Alcohol use: Yes    Comment: 5th liquor daily   Drug use: No   Sexual activity: Not on file  Other Topics Concern   Not on file  Social History Narrative   Not on file   Social Determinants of Health   Financial Resource Strain: Not on file  Food Insecurity: Not on file  Transportation Needs: Not on file  Physical Activity: Not on file  Stress: Not on file  Social Connections: Not on file  Intimate Partner Violence: Not on file     Family History:    Family History  Problem Relation Age of Onset   Heart failure Neg Hx    Heart attack Neg Hx    Fainting Neg Hx      ROS:  All other ROS reviewed and negative. Pertinent positives noted in the HPI.     Physical Exam/Data:   Vitals:   07/18/22 0600 07/18/22 0615 07/18/22 0630 07/18/22 0645  BP: 108/74 108/73 107/72 (!) 117/102  Pulse: 68 69 69 71  Resp: (!) 24 (!) 24 (!) 24 (!) 24  Temp:      TempSrc:      SpO2: 99% 99% 99% 99%  Weight:      Height:        Intake/Output Summary (Last 24 hours) at 07/18/2022 0805 Last data filed at 07/18/2022 0700 Gross per 24 hour  Intake 1087.27 ml  Output 1470 ml  Net -382.73 ml       07/18/2022    4:24 AM 07/17/2022    6:32 PM 07/16/2022   10:13 PM  Last 3 Weights  Weight (lbs) 201 lb 1 oz 199 lb 15.3 oz 202 lb 11.2 oz  Weight (kg) 91.2 kg 90.7 kg 91.944 kg    Body mass index is 25.13 kg/m.  General: Intubated and sedated on the vent Head: Atraumatic, normal size  Eyes: PEERLA, EOMI  Neck: Supple, no JVD Endocrine: No thryomegaly Cardiac: Normal S1, S2; RRR; no murmurs, rubs, or gallops Lungs: Diminished sounds bilaterally, diffuse  rhonchi Abd: Soft, nontender, no hepatomegaly  Ext: Right lower extremity with mass, 2+ pitting edema Skin: Warm and dry, no rashes   Neuro: Intubated and sedated on the vent  EKG:  The EKG was personally reviewed and demonstrates: Sinus rhythm 70 to 80 bpm Telemetry:  Telemetry was personally reviewed and demonstrates: Sinus rhythm heart rate 66, diffuse ST elevation consistent with likely pericarditis  Relevant CV Studies: TTE 07/18/2022  1. Left ventricular ejection fraction, by estimation, is 50 to 55%. The  left ventricle has low normal function. The left ventricle has no regional  wall motion abnormalities. Left ventricular diastolic parameters were  normal.   2. Right ventricular systolic function is low normal. The right  ventricular size is normal. There is normal pulmonary artery systolic  pressure.   3. The mitral valve is normal in structure. No evidence of mitral valve  regurgitation. No evidence of mitral stenosis.   4. The aortic valve is tricuspid. Aortic valve regurgitation is not  visualized. No aortic stenosis is present.   5. The inferior vena cava is normal in size with greater than 50%  respiratory variability, suggesting right atrial pressure of 3 mmHg.   Laboratory Data: High Sensitivity Troponin:   Recent Labs  Lab 07/16/22 2300 07/17/22 0050  TROPONINIHS 21* 23*     Cardiac EnzymesNo results for input(s): "TROPONINI" in the last 168 hours. No results for input(s): "TROPIPOC" in the last 168 hours.  Chemistry Recent Labs  Lab 07/16/22 2300 07/16/22 2312 07/17/22 0530 07/17/22 0645 07/17/22 0900 07/17/22 1604 07/17/22 1610 07/17/22 1805  NA 129* 128* 128*   < > 129* 136  --  134*  K 4.0 4.0 3.3*   < > 3.3* 3.6  --  3.9  CL 92* 92* 96*  --  97*  --   --   --   CO2 26  --  22  --  24  --   --   --   GLUCOSE 129* 130* 147*  --  118*  --   --   --   BUN _0 --  10  --   --   --   CREATININE 0.96 0.80 0.84  --  0.71  --  0.61  --   CALCIUM  11.1*  --  9.7  --  9.7  --   --   --   GFRNONAA >60  --  >60  --  >60  --  >60  --   ANIONGAP 11  --  10  --  8  --   --   --    < > = values in this interval not displayed.    Recent Labs  Lab 07/17/22 0530 07/17/22 0900  PROT 6.9  --   ALBUMIN 2.4* 2.3*  AST 38  --   ALT 15  --   ALKPHOS 79  --   BILITOT 2.1*  --    Hematology Recent Labs  Lab 07/17/22 0900 07/17/22 1604 07/17/22 1610 07/17/22 1805 07/18/22 0647  WBC 37.0*  --  39.4*  --  51.1*  RBC 2.78*  --  2.68*  --  2.64*  HGB 10.3*   < > 9.8* 10.2* 10.0*  HCT 30.3*   < > 30.8* 30.0* 28.3*  MCV 109.0*  --  114.9*  --  107.2*  MCH 37.1*  --  36.6*  --  37.9*  MCHC 34.0  --  31.8  --  35.3  RDW 13.2  --  13.3  --  13.3  PLT 451*  --  427*  --  480*   < > = values in this interval not displayed.   BNPNo results for input(s): "BNP", "PROBNP" in the last 168 hours.  DDimer No results for input(s): "DDIMER" in the last 168 hours.  Radiology/Studies:  DG Chest Portable 1 View  Result Date: 07/17/2022 CLINICAL DATA:  Intubation. Multiple pulmonary masses/nodules on recent chest CT suspicious for metastatic disease. EXAM: PORTABLE CHEST 1 VIEW COMPARISON:  Radiographs 07/17/2022 and 07/16/2022. CT 07/17/2022. Right lower leg MRI 02/03/2022.  FINDINGS: 1528 hours. Interval intubation. Tip of the endotracheal tube is in the mid trachea. Nasogastric tube projects below the diaphragm, tip not visualized. The heart size and mediastinal contours are grossly stable. As seen on recent studies, there are multiple pulmonary nodules bilaterally with a large mass peripherally in the left upper lobe. There are patchy airspace opacities throughout the right lung which appears slightly worse. The right costophrenic angle is incompletely visualized, but no large pleural effusion or pneumothorax is identified. The bones appear unremarkable. IMPRESSION: 1. Satisfactory position of the endotracheal and nasogastric tubes. 2. Bilateral pulmonary  nodules and left upper lobe mass most consistent with metastatic disease. 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 3. Slight worsening of patchy airspace opacities throughout the right lung suspicious for superimposed pneumonia or hemorrhage. Electronically Signed   By: Richardean Sale M.D.   On: 07/17/2022 15:44   DG Chest Portable 1 View  Result Date: 07/17/2022 CLINICAL DATA:  Shortness of breath. EXAM: PORTABLE CHEST 1 VIEW COMPARISON:  Chest radiograph 1 day prior, same day CTA chest FINDINGS: The cardiomediastinal silhouette is stable. Multiple pulmonary masses are again seen, the largest measuring up to 9.8 cm in the left lung. Right middle lobe consolidation is also unchanged. There is no new or worsening focal airspace disease compared to the radiograph from 1 day prior. There is no pleural effusion or pneumothorax There is no acute osseous abnormality. IMPRESSION: Multiple pulmonary masses and right middle lobe consolidation are unchanged. No significant interval change since the radiograph from 1 day prior or same day CTA chest. Electronically Signed   By: Valetta Mole M.D.   On: 07/17/2022 13:09   CT Angio Chest PE W and/or Wo Contrast  Result Date: 07/17/2022 CLINICAL DATA:  Pulmonary embolism. EXAM: CT ANGIOGRAPHY CHEST WITH CONTRAST TECHNIQUE: Multidetector CT imaging of the chest was performed using the standard protocol during bolus administration of intravenous contrast. Multiplanar CT image reconstructions and MIPs were obtained to evaluate the vascular anatomy. RADIATION DOSE REDUCTION: This exam was performed according to the departmental dose-optimization program which includes automated exposure control, adjustment of the mA and/or kV according to patient size and/or use of iterative reconstruction technique. CONTRAST:  39m OMNIPAQUE IOHEXOL 350 MG/ML SOLN COMPARISON:  None Available. FINDINGS: Cardiovascular:  There is adequate opacification of the pulmonary arterial tree. There are multiple synechia and eccentric filling defects within the right middle and lower lobar segmental pulmonary arteries in keeping with chronic pulmonary embolism. No intraluminal filling defect identified to suggest acute pulmonary emboli. The central pulmonary arteries are mildly enlarged in keeping with changes of pulmonary arterial hypertension. Global cardiac size within normal limits. No pericardial effusion. The thoracic aorta is unremarkable. There is an intraluminal filling defect within the right superior pulmonary vein which may represent intraluminal thrombus or intravascular extension of tumor. Mediastinum/Nodes: Visualized thyroid is unremarkable. No pathologic thoracic adenopathy. The esophagus is unremarkable. Lungs/Pleura: There are innumerable pulmonary masses seen bilaterally most in keeping with widespread pulmonary metastatic disease. Dominant mass within the left upper lobe measures 7.9 cm in greatest dimension. There is superimposed tree-in-bud nodularity throughout the right lung, consolidation within the right middle lobe, and bronchial wall thickening and bronchiolectasis best appreciated within the right lower lobe. These findings can be seen the setting of superimposed acute multilobar infection or, less likely, endobronchial extension of disease. No pneumothorax or pleural effusion. Layering debris is seen within the distal trachea. Upper Abdomen:  The liver contour is nodular in keeping with underlying cirrhosis. No acute abnormality. Musculoskeletal: No acute bone abnormality. No lytic or blastic bone lesion. Review of the MIP images confirms the above findings. IMPRESSION: 1. 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 No acute pulmonary emboli identified. 2. Innumerable pulmonary masses bilaterally most in keeping with widespread pulmonary metastatic disease. 3.  Superimposed tree-in-bud nodularity throughout the right lung, consolidation within the right middle lobe, and bronchial wall thickening and bronchiolectasis best appreciated within the right lower lobe. These findings can be seen the setting of superimposed acute multilobar infection or, less likely, endobronchial extension of disease. 4. Intraluminal filling defect within the right superior pulmonary vein which may represent intraluminal thrombus or intravascular extension of tumor. 5. Morphologic changes in keeping with underlying pulmonary arterial hypertension. 6. Cirrhosis. Electronically Signed   By: Fidela Salisbury M.D.   On: 07/17/2022 01:16   DG Chest 2 View  Result Date: 07/16/2022 CLINICAL DATA:  Shortness of breath EXAM: CHEST - 2 VIEW COMPARISON:  04/21/2014 FINDINGS: Stable cardiomediastinal silhouette. Numerous bilateral nodular opacities measuring up to 2.8 cm on the right. Large subpleural mass measuring approximately 9.4 cm in the left midthorax. Hazy airspace opacity in the right lower lung. No pleural effusion or pneumothorax. No acute osseous abnormality. IMPRESSION: Large left mid lung subpleural mass. Numerous bilateral pulmonary nodules. Findings are concerning for metastases. Hazy opacity in the right lower lobe is nonspecific and may be neoplastic or infectious. These findings would be better evaluated with CT chest with IV contrast. Electronically Signed   By: Placido Sou M.D.   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  He is  currently critically ill and septic shock on pressor support.  He has a known history of widely metastatic cancer that has not been worked up fully.  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  Has a history of pulmonary emboli with evidence of chronic thromboembolic pulmonary hypertension seen on chest CT. -This morning EKG was noted to have diffuse ST elevation.  A stat echocardiogram was obtained which shows normal LV function and no wall motion abnormality. -Chest CT shows no evidence of coronary calcium. -Overall this is likely consistent with acute pericarditis in the setting of influenza and critical illness.  He cannot indicate if he is having chest discomfort as he is critically ill on the ventilator.  He has no wall motion abnormality to suggest an acute infarct is happening here.  There are no reciprocal ST depressions to delineate a specific epicardial coronary artery. -He is currently on high-dose stress steroids.  Will continue this.  Have added colchicine 0.6 mg twice daily.  No indication for invasive angiography at this time. -Okay to continue aspirin.  Given cirrhosis would be cautious with statin.  Also unclear what his long-term benefit will be for aggressive medical therapy.  No beta-blocker indicated. -We will check ESR and CRP to guide pericarditis treatment. -Troponins are pending.  Will trend these.  Suspect they will be elevated in the setting of critical illness but again his EKG  is inconsistent with ST elevation myocardial infarction. -Will continue heparin for his chronic PEs.   -Cardiology will follow along.  It appears his prognosis is quite poor.    For questions or updates, please contact [place] Please consult [email or web address] for contact info under    Signed, [place] T. Audie Box, MD, [place]  07/18/2022 8:05 AM

## 2022-07-18 NOTE — Progress Notes (Signed)
ANTICOAGULATION CONSULT NOTE  Pharmacy Consult for Heparin>enox Indication: pulmonary embolus Brief A/P: Heparin level subtherapeutic Increase Heparin rate  No Known Allergies  Patient Measurements: Height: '6\' 3"'$  (190.5 cm) Weight: 91.2 kg (201 lb 1 oz) IBW/kg (Calculated) : 84.5  Vital Signs: Temp: 97.5 F (36.4 C) (12/29 0834) Temp Source: Bladder (12/29 0834) BP: 111/62 (12/29 0800) Pulse Rate: 77 (12/29 0834)  Labs: Recent Labs    07/16/22 2300 07/16/22 2312 07/17/22 0050 07/17/22 0530 07/17/22 0900 07/17/22 0951 07/17/22 1610 07/17/22 1805 07/18/22 0100 07/18/22 0647  HGB 11.7*   < >  --    < > 10.3*   < > 9.8* 10.2*  --  10.0*  HCT 34.7*   < >  --    < > 30.3*   < > 30.8* 30.0*  --  28.3*  PLT 595*  --   --    < > 451*  --  427*  --   --  480*  HEPARINUNFRC  --   --   --   --   --    < > <0.10*  --  0.12* 0.30  CREATININE 0.96   < >  --    < > 0.71  --  0.61  --   --  0.66  TROPONINIHS 21*  --  23*  --   --   --   --   --   --   --    < > = values in this interval not displayed.     Estimated Creatinine Clearance: 145.2 mL/min (by C-G formula based on SCr of 0.66 mg/dL).  Assessment: 9 YOM presenting with SOB found to have chronic PE. Hx of DVT in July of 2023. Now c/f CTEPH. Pharmacy consulted to transition from heparin gtt to lovenox for therapeutic anticoagulation. No anticoagulation PTA.   Goal of Therapy:  Heparin level 0.3-0.7 units/ml Monitor platelets by anticoagulation protocol: Yes   Plan:  Stop heparin gtt and give first lovenox dose at time of heparin discontinuation -enoxaparin '1mg'$ /kg q12h ('90mg'$  SQ q12h) -CBC daily, Scr  -f/u s/sx bleeding  Carolin Guernsey 07/18/2022,9:18 AM

## 2022-07-18 NOTE — Progress Notes (Signed)
eLink Physician-Brief Progress Note Patient Name: [name] DOB: 01/16/1981 MRN: [id number]   Date of Service  07/18/2022  HPI/Events of Note  EKG changes compatible with STEMI, patient is intubated / sedated with multiple serious co-morbidities, including influenza / bacterial pneumonia, stage 4 cancer, and shock.  eICU Interventions  Heparin + aspirin, echocardiogram, trend Troponin, stat cardiology consultation.        Kerry Kass Khalif Stender 07/18/2022, 6:36 AM

## 2022-07-18 NOTE — Progress Notes (Signed)
  Echocardiogram 2D Echocardiogram has been performed.  [name] 07/18/2022, 8:09 AM

## 2022-07-18 NOTE — Progress Notes (Addendum)
ANTICOAGULATION CONSULT NOTE  Pharmacy Consult for Heparin Indication: pulmonary embolus Brief A/P: Heparin level subtherapeutic Increase Heparin rate  No Known Allergies  Patient Measurements: Height: '6\' 3"'$  (190.5 cm) Weight: 90.7 kg (199 lb 15.3 oz) IBW/kg (Calculated) : 84.5  Vital Signs: Temp: 98.2 F (36.8 C) (12/29 0000) Temp Source: Bladder (12/29 0000) BP: 103/70 (12/29 0100) Pulse Rate: 81 (12/29 0100)  Labs: Recent Labs    07/16/22 2300 07/16/22 2312 07/17/22 0050 07/17/22 0530 07/17/22 0645 07/17/22 0900 07/17/22 0951 07/17/22 1604 07/17/22 1610 07/17/22 1805 07/18/22 0100  HGB 11.7*   < >  --  10.6*   < > 10.3*  --  10.2* 9.8* 10.2*  --   HCT 34.7*   < >  --  31.0*   < > 30.3*  --  30.0* 30.8* 30.0*  --   PLT 595*  --   --  499*  --  451*  --   --  427*  --   --   HEPARINUNFRC  --   --   --   --   --   --  <0.10*  --  <0.10*  --  0.12*  CREATININE 0.96   < >  --  0.84  --  0.71  --   --  0.61  --   --   TROPONINIHS 21*  --  23*  --   --   --   --   --   --   --   --    < > = values in this interval not displayed.     Estimated Creatinine Clearance: 145.2 mL/min (by C-G formula based on SCr of 0.61 mg/dL).  Assessment: 41 y.o. male with PE for heparin Goal of Therapy:  Heparin level 0.3-0.7 units/ml Monitor platelets by anticoagulation protocol: Yes   Plan:  Heparin 3000 units IV bolus, then increase heparin 2350 units/hr Check heparin level in 6 hours.     Caryl Pina 07/18/2022,2:01 AM

## 2022-07-18 NOTE — Progress Notes (Signed)
Initial Nutrition Assessment  DOCUMENTATION CODES:  Non-severe (moderate) malnutrition in context of chronic illness  INTERVENTION:  Continue tube feeding via OGT. Adjust to to the following: Vital 1.5 at 70 ml/h (1680 ml per day) Prosource TF20 59m 1x/d Provides 2600 kcal, 133 gm protein, 1284 ml free water daily Continue MVI, thiamine and folic acid for hx of alcohol use  NUTRITION DIAGNOSIS:  Moderate Malnutrition (in the context of chronic illness) related to poor appetite as evidenced by mild fat depletion, mild muscle depletion.  GOAL:  Patient will meet greater than or equal to 90% of their needs  MONITOR:  Vent status, Labs, TF tolerance, I & O's  REASON FOR ASSESSMENT:  Consult Enteral/tube feeding initiation and management  ASSESSMENT:  Pt with hx of leg mass (likely malignant but pt did not follow-up after dx), DVT, and alcohol abuse presented to ED with several days of SOB and cough. Febrile and hypoxic in ED. Imaging showed cirrhosis, pneumonia and innumerable pulmonary masses consistent with widespread metastatic disease. Pt also found to be Flu+.  12/28 - intubation  Patient is currently intubated on ventilator support. Family in room at the time of assessment state that pt has always had a good appetite and until recently maintained his weight at ~225 lbs. No recent recorded weights, but do note that pt has lost weight over the last ~2 years. In ED, pt reported that he has had poor appetite for the past couple of months, no energy and has lost approximately 40 pounds. Mild muscle and fat deficits also noted on exam.   Pt currently with Vital High Protein infusing from TF protocol, will adjust to better meet needs.   MV: 15.7 L/min Temp (24hrs), Avg:97.9 F (36.6 C), Min:96.6 F (35.9 C), Max:98.8 F (37.1 C)  Propofol: 22.1 ml/hr (583kcal/d)   Intake/Output Summary (Last 24 hours) at 07/18/2022 1431 Last data filed at 07/18/2022 1200 Gross per 24 hour   Intake 2293.46 ml  Output 1750 ml  Net 543.46 ml  Net IO Since Admission: 2,623.46 mL [07/18/22 1431]  Nutritionally Relevant Medications: Scheduled Meds:  colchicine  0.6 mg Oral BID   famotidine  20 mg Per Tube BID   PROSource TF20  60 mL Per Tube Daily   VITAL HIGH PROTEIN  1,000 mL Per Tube QR60A  folic acid  1 mg Oral Daily   insulin aspart  0-9 Units Subcutaneous Q4H   methylPREDNISolone injection  100 mg Intravenous Q12H   multivitamin with minerals  1 tablet Oral Daily   thiamine  100 mg Oral Daily   Continuous Infusions:  magnesium sulfate bolus IVPB     norepinephrine (LEVOPHED) Adult infusion 2 mcg/min (07/18/22 0600)   propofol (DIPRIVAN) infusion 40 mcg/kg/min (07/18/22 0649)   PRN Meds: docusate sodium, polyethylene glycol  Labs Reviewed: Na 134 Mg 1.5 CBG ranges from 75-187 mg/dL over the last 24 hours  NUTRITION - FOCUSED PHYSICAL EXAM: Flowsheet Row Most Recent Value  Orbital Region Mild depletion  Upper Arm Region Mild depletion  Thoracic and Lumbar Region Mild depletion  Buccal Region Mild depletion  Temple Region Mild depletion  Clavicle Bone Region Moderate depletion  Clavicle and Acromion Bone Region Mild depletion  Scapular Bone Region No depletion  Dorsal Hand No depletion  Patellar Region No depletion  Anterior Thigh Region No depletion  Posterior Calf Region No depletion  Edema (RD Assessment) None  Hair Reviewed  Eyes Reviewed  Mouth Reviewed  Skin Reviewed  Nails Reviewed   Diet  Order:   Diet Order     None       EDUCATION NEEDS:  Not appropriate for education at this time  Skin:  Skin Assessment: Reviewed RN Assessment  Last BM:  prior to admission  Height:  Ht Readings from Last 1 Encounters:  07/17/22 '6\' 3"'$  (1.905 m)    Weight:  Wt Readings from Last 1 Encounters:  07/18/22 91.2 kg    Ideal Body Weight:  89.1 kg  BMI:  Body mass index is 25.13 kg/m.  Estimated Nutritional Needs:  Kcal:  2500-2700  kcal/d Protein:  125-140g/d Fluid:  2.5-2.7L/d    Ranell Patrick, RD, LDN Clinical Dietitian RD pager # available in [facility]  After hours/weekend pager # available in [facility]

## 2022-07-18 NOTE — Progress Notes (Signed)
An USGPIV (ultrasound guided PIV) has been placed for short-term vasopressor infusion. A correctly placed ivWatch must be used when administering Vasopressors. Should this treatment be needed beyond 72 hours, central line access should be obtained.  It will be the responsibility of the bedside nurse to follow best practice to prevent extravasations.   ?

## 2022-07-18 NOTE — Progress Notes (Signed)
Rt and RN transported pt to CT and back to 2M13 without any complications.

## 2022-07-19 ENCOUNTER — Encounter (HOSPITAL_COMMUNITY): Payer: Self-pay | Admitting: Pulmonary Disease

## 2022-07-19 DIAGNOSIS — E440 Moderate protein-calorie malnutrition: Secondary | ICD-10-CM | POA: Insufficient documentation

## 2022-07-19 DIAGNOSIS — J189 Pneumonia, unspecified organism: Secondary | ICD-10-CM | POA: Diagnosis not present

## 2022-07-19 LAB — CBC
HCT: 30.8 % — ABNORMAL LOW (ref 39.0–52.0)
Hemoglobin: 10.2 g/dL — ABNORMAL LOW (ref 13.0–17.0)
MCH: 36.4 pg — ABNORMAL HIGH (ref 26.0–34.0)
MCHC: 33.1 g/dL (ref 30.0–36.0)
MCV: 110 fL — ABNORMAL HIGH (ref 80.0–100.0)
Platelets: 580 10*3/uL — ABNORMAL HIGH (ref 150–400)
RBC: 2.8 MIL/uL — ABNORMAL LOW (ref 4.22–5.81)
RDW: 13.6 % (ref 11.5–15.5)
WBC: 51.9 10*3/uL (ref 4.0–10.5)
nRBC: 0 % (ref 0.0–0.2)

## 2022-07-19 LAB — GLUCOSE, CAPILLARY
Glucose-Capillary: 107 mg/dL — ABNORMAL HIGH (ref 70–99)
Glucose-Capillary: 117 mg/dL — ABNORMAL HIGH (ref 70–99)
Glucose-Capillary: 144 mg/dL — ABNORMAL HIGH (ref 70–99)
Glucose-Capillary: 144 mg/dL — ABNORMAL HIGH (ref 70–99)
Glucose-Capillary: 152 mg/dL — ABNORMAL HIGH (ref 70–99)
Glucose-Capillary: 86 mg/dL (ref 70–99)

## 2022-07-19 LAB — MAGNESIUM: Magnesium: 2.1 mg/dL (ref 1.7–2.4)

## 2022-07-19 LAB — PHOSPHORUS: Phosphorus: 3.1 mg/dL (ref 2.5–4.6)

## 2022-07-19 NOTE — Progress Notes (Signed)
An USGPIV (ultrasound guided PIV) has been placed for short-term vasopressor infusion. A correctly placed ivWatch must be used when administering Vasopressors. Should this treatment be needed beyond 72 hours, central line access should be obtained.  It will be the responsibility of the bedside nurse to follow best practice to prevent extravasations.   ?

## 2022-07-19 NOTE — Procedures (Signed)
Extubation Procedure Note  Patient Details:   Name: Mahamed Zalewski DOB: 07-23-1980 MRN: [id number]   Airway Documentation:    Vent end date: 07/19/22 Vent end time: 0945   Evaluation  O2 sats: stable throughout Complications: No apparent complications Patient did tolerate procedure well. Bilateral Breath Sounds: Clear, Diminished   Yes  Alvera Singh 07/19/2022, 10:07 AM

## 2022-07-19 NOTE — Evaluation (Signed)
Physical Therapy Evaluation Patient Details Name: [name] MRN: [id number] DOB: March 26, 1981 Today's Date: 07/19/2022  History of Present Illness  41 y.o. male presents to [facility] hospital on 07/17/2022 with SOB and cough, found to have flu and PNA. Pt developed AMS and required intubation on 10/28, extubated 10/30. PMH includes RLE mass and DVT, epistaxis, and tooth infection.  Clinical Impression  Pt admitted with/for PNA and flu requiring intubation.  Pt is not at baseline function needing mod assist of 1-2 persons for basic mobility and gait.Marland Kitchen  Pt currently limited functionally due to the problems listed. ( See problems list.)   Pt will benefit from PT to maximize function and safety in order to get ready for next venue listed below.        Recommendations for follow up therapy are one component of a multi-disciplinary discharge planning process, led by the attending physician.  Recommendations may be updated based on patient status, additional functional criteria and insurance authorization.  Follow Up Recommendations Acute inpatient rehab (3hours/day)      Assistance Recommended at Discharge Intermittent Supervision/Assistance  Patient can return home with the following  A little help with walking and/or transfers;A little help with bathing/dressing/bathroom;Assistance with cooking/housework;Direct supervision/assist for medications management;Assist for transportation;Help with stairs or ramp for entrance    Equipment Recommendations Rolling walker (2 wheels);BSC/3in1 (TBD before d/c home)  Recommendations for Other Services  Rehab consult    Functional Status Assessment Patient has had a recent decline in their functional status and demonstrates the ability to make significant improvements in function in a reasonable and predictable amount of time.     Precautions / Restrictions Precautions Precautions: Fall      Mobility  Bed Mobility Overal bed mobility: Needs  Assistance Bed Mobility: Sidelying to Sit, Rolling Rolling: Mod assist Sidelying to sit: Mod assist       General bed mobility comments: assist at trunk and R LE to roll and transition up via L UE.  Use of padding to help pt pivot    Transfers Overall transfer level: Needs assistance   Transfers: Sit to/from Stand Sit to Stand: Mod assist           General transfer comment: assist forward and with boost    Ambulation/Gait Ambulation/Gait assistance: Mod assist Gait Distance (Feet): 50 Feet (x2 with 4 min sittin rest in between) Assistive device: Rolling walker (2 wheels) Gait Pattern/deviations: Step-to pattern, Step-through pattern, Decreased step length - right, Decreased step length - left, Decreased stride length   Gait velocity interpretation: <1.31 ft/sec, indicative of household ambulator   General Gait Details: unsteady, slow with short step length, worse on the R.  cues for posture and proximity to the RW, pt can attain submaximal upright stance, but frequently and  quickly droops/flexes forward parallel to the floor, needing to stop regroup with facilitation to a more upright stance  Stairs            Wheelchair Mobility    Modified Rankin (Stroke Patients Only)       Balance Overall balance assessment: Needs assistance Sitting-balance support: Bilateral upper extremity supported, Feet supported Sitting balance-Leahy Scale: Poor Sitting balance - Comments: reliant on UE's or external support.  Quick to fatigue   Standing balance support: Bilateral upper extremity supported Standing balance-Leahy Scale: Poor Standing balance comment: reliant on RW and external support  Pertinent Vitals/Pain Pain Assessment Pain Assessment: Faces Faces Pain Scale: No hurt Pain Intervention(s): Monitored during session    Home Living Family/patient expects to be discharged to:: Private residence Living Arrangements:  Non-relatives/Friends (pt and roommate) Available Help at Discharge: Family;Friend(s) (roommate, 2 dtrs and their moms) Type of Home: Apartment Home Access: Level entry       Home Layout: One level Home Equipment: None      Prior Function Prior Level of Function : Independent/Modified Independent                     Hand Dominance        Extremity/Trunk Assessment   Upper Extremity Assessment Upper Extremity Assessment: Generalized weakness;Overall [facility] for tasks assessed    Lower Extremity Assessment Lower Extremity Assessment: Generalized weakness;Overall [facility] for tasks assessed    Cervical / Trunk Assessment Cervical / Trunk Assessment:  (trunk generally weak)  Communication   Communication: No difficulties  Cognition Arousal/Alertness: Awake/alert Behavior During Therapy: Flat affect Overall Cognitive Status: Impaired/Different from baseline Area of Impairment: Attention, Awareness, Orientation                 Orientation Level: Time, Situation Current Attention Level: Sustained       Awareness: Intellectual            General Comments General comments (skin integrity, edema, etc.): vss, PLETH was noisy so no good reading on SpO2    Exercises     Assessment/Plan    PT Assessment Patient needs continued PT services  PT Problem List Decreased strength;Decreased activity tolerance;Decreased balance;Decreased mobility;Decreased coordination;Decreased cognition;Decreased knowledge of use of DME;Cardiopulmonary status limiting activity       PT Treatment Interventions DME instruction;Gait training;Stair training;Functional mobility training;Therapeutic activities;Balance training;Patient/family education    PT Goals (Current goals can be found in the Care Plan section)  Acute Rehab PT Goals PT Goal Formulation: Patient unable to participate in goal setting Time For Goal Achievement: 08/02/22 Potential to Achieve Goals: Good    Frequency  Min 3X/week     Co-evaluation               AM-PAC PT "6 Clicks" Mobility  Outcome Measure Help needed turning from your back to your side while in a flat bed without using bedrails?: A Lot Help needed moving from lying on your back to sitting on the side of a flat bed without using bedrails?: A Lot Help needed moving to and from a bed to a chair (including a wheelchair)?: A Lot Help needed standing up from a chair using your arms (e.g., wheelchair or bedside chair)?: A Lot Help needed to walk in hospital room?: Total Help needed climbing 3-5 steps with a railing? : Total 6 Click Score: 10    End of Session Equipment Utilized During Treatment: Oxygen Activity Tolerance: Patient limited by fatigue Patient left: in chair;with call bell/phone within reach;with chair alarm set Nurse Communication: Mobility status PT Visit Diagnosis: Other abnormalities of gait and mobility (R26.89);Muscle weakness (generalized) (M62.81);Unsteadiness on feet (R26.81);Difficulty in walking, not elsewhere classified (R26.2)    Time: 8786-7672 PT Time Calculation (min) (ACUTE ONLY): 30 min   Charges:   PT Evaluation $PT Eval Moderate Complexity: 1 Mod PT Treatments $Gait Training: 8-22 mins        07/19/2022  Ginger Carne., PT Acute Rehabilitation Services [phone]  (office)  Tessie Fass Gauge Winski 07/19/2022, 6:53 PM

## 2022-07-19 NOTE — Plan of Care (Signed)
No changes in yesterday's recommendations. Continue management for pericarditis. Can reach out closer to discharge to help with follow-up.

## 2022-07-19 NOTE — Progress Notes (Signed)
   NAME:  [name], MRN:  [id number], DOB:  1981-02-09, LOS: 2 ADMISSION DATE:  07/16/2022, CONSULTATION DATE: July 17, 2022 REFERRING MD: Dr. Francine Graven, CHIEF COMPLAINT: Dyspnea  History of Present Illness:  41 year old male with known right leg muscle mass and DVT presented to the emergency department on July 17, 2022 early in the morning complaining of shortness of breath and cough.  Was found to have influenza and pneumonia.  Has been treated with Tamiflu and broad-spectrum antibiotics for concern for bacterial superinfection.  Has been receiving oxygen, breathing treatments and has had some wheezing.  Developed progressive shortness of breath, mental status changes and became minimally responsive.  Pulmonary and critical care medicine was consulted for worsening respiratory failure and change in mental status.  On arrival the patient was unresponsive and unable to provide history.  History was obtained by talking to caregivers and reviewing the record.  Pertinent  Medical History  DVT diagnosed right leg July 2023 Right leg muscle mass diagnosed July 2023, referred to [facility] orthopedics, never followed up. Liver cirrhosis  Significant Hospital Events: Including procedures, antibiotic start and stop dates in addition to other pertinent events   July 17, 2022 CT angiogram chest images independently reviewed showing multiple large masses bilaterally, and filtrate, tree-in-bud abnormality and consolidation in the right middle lobe and right lower lobe, chronic thromboembolism noted, no changes consistent with acute thromboembolism, pulmonary vein filling defect worrisome for intravascular tumor, cirrhosis July 17, 2022 intubated, transferred to intensive care unit  Interim History / Subjective:  No events. On vent. Waking up.  Objective   Blood pressure 120/64, pulse (!) 105, temperature 98.1 F (36.7 C), resp. rate 20, height '6\' 3"'$  (1.905 m), weight 91.2 kg,  SpO2 98 %.    Vent Mode: CPAP;PSV FiO2 (%):  [40 %-60 %] 40 % Set Rate:  [24 bmp] 24 bmp Vt Set:  [670 mL] 670 mL PEEP:  [5 cmH20] 5 cmH20 Pressure Support:  [10 cmH20] 10 cmH20 Plateau Pressure:  [21 cmH20] 21 cmH20   Intake/Output Summary (Last 24 hours) at 07/19/2022 9381 Last data filed at 07/19/2022 0900 Gross per 24 hour  Intake 3461.1 ml  Output 1385 ml  Net 2076.1 ml    Filed Weights   07/16/22 2213 07/17/22 1832 07/18/22 0424  Weight: 91.9 kg 90.7 kg 91.2 kg    Examination: No distress Wheezing improved Small secretions Following commands/tracking Stable R leg tumor  Stable high WBC  Resolved Hospital Problem list     Assessment & Plan:  Acute respiratory failure with hypoxemia Influenza pneumonia ?Strep pneumo pneumonia vs. 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 Audie Box, no WMA, looks more c/w either chronic RV dysfunction or pericarditis  Unasyn Full dose lovenox Vent bundle, see how does with SAT/SBT Need to discuss if he is willing to pursue biopsy Brother at bedside updated  Best Practice (right click and "Reselect all SmartList Selections" daily)   Diet/type: tubefeeds DVT prophylaxis: full dose lovenox GI prophylaxis: H2B Lines: N/A Foley:  N/A Code Status:  full code Last date of multidisciplinary goals of care discussion [today]  32 min cc time Erskine Emery MD PCCM

## 2022-07-20 DIAGNOSIS — J189 Pneumonia, unspecified organism: Secondary | ICD-10-CM | POA: Diagnosis not present

## 2022-07-20 LAB — GLUCOSE, CAPILLARY
Glucose-Capillary: 103 mg/dL — ABNORMAL HIGH (ref 70–99)
Glucose-Capillary: 113 mg/dL — ABNORMAL HIGH (ref 70–99)
Glucose-Capillary: 80 mg/dL (ref 70–99)
Glucose-Capillary: 96 mg/dL (ref 70–99)

## 2022-07-20 LAB — CBC
HCT: 29.2 % — ABNORMAL LOW (ref 39.0–52.0)
Hemoglobin: 9.4 g/dL — ABNORMAL LOW (ref 13.0–17.0)
MCH: 36.4 pg — ABNORMAL HIGH (ref 26.0–34.0)
MCHC: 32.2 g/dL (ref 30.0–36.0)
MCV: 113.2 fL — ABNORMAL HIGH (ref 80.0–100.0)
Platelets: 425 10*3/uL — ABNORMAL HIGH (ref 150–400)
RBC: 2.58 MIL/uL — ABNORMAL LOW (ref 4.22–5.81)
RDW: 13.6 % (ref 11.5–15.5)
WBC: 21.7 10*3/uL — ABNORMAL HIGH (ref 4.0–10.5)
nRBC: 0.1 % (ref 0.0–0.2)

## 2022-07-20 MED ORDER — ADULT MULTIVITAMIN W/MINERALS CH
1.0000 | ORAL_TABLET | Freq: Every day | ORAL | Status: DC
  Administered 2022-07-20 – 2022-08-08 (×20): 1 via ORAL
  Filled 2022-07-20 (×20): qty 1

## 2022-07-20 MED ORDER — FAMOTIDINE 20 MG PO TABS
20.0000 mg | ORAL_TABLET | Freq: Two times a day (BID) | ORAL | Status: DC
  Administered 2022-07-20 – 2022-07-31 (×24): 20 mg via ORAL
  Filled 2022-07-20 (×26): qty 1

## 2022-07-20 MED ORDER — COLCHICINE 0.6 MG PO TABS
0.6000 mg | ORAL_TABLET | Freq: Two times a day (BID) | ORAL | Status: DC
  Administered 2022-07-20 – 2022-08-08 (×38): 0.6 mg via ORAL
  Filled 2022-07-20 (×40): qty 1

## 2022-07-20 MED ORDER — THIAMINE HCL 100 MG/ML IJ SOLN
100.0000 mg | Freq: Every day | INTRAMUSCULAR | Status: DC
  Filled 2022-07-20 (×2): qty 2

## 2022-07-20 MED ORDER — OSELTAMIVIR PHOSPHATE 6 MG/ML PO SUSR
75.0000 mg | Freq: Two times a day (BID) | ORAL | Status: AC
  Administered 2022-07-20 – 2022-07-21 (×3): 75 mg via ORAL
  Filled 2022-07-20 (×3): qty 12.5

## 2022-07-20 MED ORDER — THIAMINE MONONITRATE 100 MG PO TABS
100.0000 mg | ORAL_TABLET | Freq: Every day | ORAL | Status: DC
  Administered 2022-07-20 – 2022-08-08 (×20): 100 mg via ORAL
  Filled 2022-07-20 (×20): qty 1

## 2022-07-20 MED ORDER — FOLIC ACID 1 MG PO TABS
1.0000 mg | ORAL_TABLET | Freq: Every day | ORAL | Status: DC
  Administered 2022-07-20 – 2022-08-08 (×20): 1 mg via ORAL
  Filled 2022-07-20 (×20): qty 1

## 2022-07-20 MED ORDER — ASPIRIN 81 MG PO CHEW
81.0000 mg | CHEWABLE_TABLET | Freq: Every day | ORAL | Status: DC
  Administered 2022-07-20 – 2022-08-08 (×20): 81 mg via ORAL
  Filled 2022-07-20 (×20): qty 1

## 2022-07-20 MED ORDER — INSULIN ASPART 100 UNIT/ML IJ SOLN
0.0000 [IU] | Freq: Three times a day (TID) | INTRAMUSCULAR | Status: DC
  Administered 2022-07-23: 1 [IU] via SUBCUTANEOUS
  Administered 2022-07-23: 2 [IU] via SUBCUTANEOUS
  Administered 2022-07-24: 1 [IU] via SUBCUTANEOUS
  Administered 2022-07-24 (×2): 2 [IU] via SUBCUTANEOUS
  Administered 2022-07-25: 1 [IU] via SUBCUTANEOUS
  Administered 2022-07-25: 3 [IU] via SUBCUTANEOUS
  Administered 2022-07-25: 2 [IU] via SUBCUTANEOUS
  Administered 2022-07-26: 1 [IU] via SUBCUTANEOUS
  Administered 2022-07-26: 3 [IU] via SUBCUTANEOUS
  Administered 2022-07-26: 2 [IU] via SUBCUTANEOUS
  Administered 2022-07-27: 1 [IU] via SUBCUTANEOUS
  Administered 2022-07-27: 5 [IU] via SUBCUTANEOUS
  Administered 2022-07-27: 2 [IU] via SUBCUTANEOUS
  Administered 2022-07-28: 1 [IU] via SUBCUTANEOUS
  Administered 2022-07-28 (×2): 2 [IU] via SUBCUTANEOUS
  Administered 2022-07-29 – 2022-07-31 (×3): 1 [IU] via SUBCUTANEOUS
  Administered 2022-07-31: 2 [IU] via SUBCUTANEOUS
  Administered 2022-07-31 – 2022-08-02 (×4): 1 [IU] via SUBCUTANEOUS
  Administered 2022-08-02: 2 [IU] via SUBCUTANEOUS
  Administered 2022-08-03: 1 [IU] via SUBCUTANEOUS
  Administered 2022-08-03 – 2022-08-05 (×3): 2 [IU] via SUBCUTANEOUS
  Administered 2022-08-06: 1 [IU] via SUBCUTANEOUS

## 2022-07-20 NOTE — Progress Notes (Signed)
Inpatient Rehab Admissions Coordinator Note:   Per therapy patient was screened for CIR candidacy by Kiylah Loyer Danford Bad, CCC-SLP. At this time, pt appears to be a potential candidate for CIR. I will place an order for rehab consult for full assessment, per our protocol.  Please contact me any with questions..  Would appreciate an OT evaluation.     Gayland Curry, [place], [place] Admissions Coordinator [phone] 07/20/22 4:57 PM

## 2022-07-20 NOTE — Progress Notes (Signed)
ANTICOAGULATION CONSULT NOTE  Pharmacy Consult for Heparin>enox Indication: pulmonary embolus Brief A/P: Heparin level subtherapeutic Increase Heparin rate  No Known Allergies  Patient Measurements: Height: '6\' 3"'$  (190.5 cm) Weight: 91.2 kg (201 lb 1 oz) IBW/kg (Calculated) : 84.5  Vital Signs: Temp: 98 F (36.7 C) (12/31 0349) Temp Source: Oral (12/31 0349) BP: 130/70 (12/31 0349) Pulse Rate: 106 (12/31 0349)  Labs: Recent Labs    07/17/22 1610 07/17/22 1805 07/18/22 0100 07/18/22 0647 07/18/22 0839 07/19/22 0641 07/20/22 0057  HGB 9.8*   < >  --  10.0*  --  10.2* 9.4*  HCT 30.8*   < >  --  28.3*  --  30.8* 29.2*  PLT 427*  --   --  480*  --  580* 425*  HEPARINUNFRC <0.10*  --  0.12* 0.30  --   --   --   CREATININE 0.61  --   --  0.66  --   --   --   TROPONINIHS  --   --   --  16 9  --   --    < > = values in this interval not displayed.     Estimated Creatinine Clearance: 145.2 mL/min (by C-G formula based on SCr of 0.66 mg/dL).  Assessment: 69 YOM presenting with SOB found to have chronic PE. Hx of DVT in July of 2023. Now c/f CTEPH. Pharmacy consulted to transition from heparin gtt to lovenox for therapeutic anticoagulation. No anticoagulation PTA. Hgb 9.4 slightly trending down, PLT stable. Scr at baseline.   Goal of Therapy:  Heparin level 0.3-0.7 units/ml Monitor platelets by anticoagulation protocol: Yes   Plan:  Continue enoxaparin '1mg'$ /kg q12h ('90mg'$  SQ q12h) -CBC daily, Scr 1/1 AM -f/u s/sx bleeding  Eliseo Gum, PharmD PGY1 Pharmacy Resident   07/20/2022  11:25 AM

## 2022-07-20 NOTE — Progress Notes (Addendum)
PROGRESS NOTE    [name]  JQZ:[id number] DOB: 02-03-1981 DOA: 07/16/2022 PCP: Patient, No Pcp Per   Brief Narrative:    41 year old male with known right leg muscle mass and DVT presented to the emergency department on July 17, 2022 early in the morning complaining of shortness of breath and cough. Was found to have influenza and pneumonia. Has been treated with Tamiflu and broad-spectrum antibiotics for concern for bacterial superinfection. Has been receiving oxygen, breathing treatments and has had some wheezing. Developed progressive shortness of breath, mental status changes and became minimally responsive. Pulmonary and critical care medicine was consulted for worsening respiratory failure and change in mental status.  CT angiogram on 12/28 demonstrated multiple large masses bilaterally with chronic thromboembolism.  He was subsequently intubated and transferred to ICU.  He was noted to have diffuse ST elevations in 12/29 consistent with acute pericarditis and has been seen by cardiology with initiation of high-dose stress steroids and is now on colchicine.  Plan to wean oxygen further and PT evaluation with recommendation for acute inpatient rehabilitation noted and patient will require placement.  He appears to be tolerating diet.  Anticipate discharge once rehab facility bed available.  Assessment & Plan:   Principal Problem:   Postobstructive pneumonia Active Problems:   Metastasis to lung (HCC)   Hyponatremia   Chronic pulmonary embolism (HCC)   Liver cirrhosis (HCC)   DVT (deep venous thrombosis) (HCC)   Leg mass, right   Respiratory failure (HCC)   Malnutrition of moderate degree  Assessment and Plan:  Acute hypoxemic respiratory failure secondary to influenza and strep pneumo pneumonia-improving -Tamiflu ongoing with isolation precautions -Continues on Unasyn with stop date 1/3 -Work on weaning off oxygen, currently on 2 L nasal cannula  Acute pericarditis  secondary to above -Continues on colchicine -Appreciate ongoing cardiology recommendations  Acute metabolic encephalopathy secondary to above-resolved  Widely metastatic malignancy-unknown primary, 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 -Possible need for biopsy in the near future, will need to discuss if he is willing to proceed -Consulted palliative for goals of care  Liver cirrhosis  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  -Continue full dose Lovenox  DVT prophylaxis: Full dose Lovenox Code Status: Full Family Communication: None at bedside Disposition Plan:  Status is: Inpatient Remains inpatient appropriate because: Need for ongoing IV medications   Consultants:  Cardiology PCCM Palliative  Procedures:  Intubation 12/20  Antimicrobials:  Anti-infectives (From admission, onward)    Start     Dose/Rate Route Frequency Ordered Stop   07/18/22 1700  Ampicillin-Sulbactam (UNASYN) 3 g in sodium chloride 0.9 % 100 mL IVPB        3 g 200 mL/hr over 30 Minutes Intravenous Every 6 hours 07/18/22 1125 07/23/22 0459   07/18/22 1100  Ampicillin-Sulbactam (UNASYN) 3 g in sodium chloride 0.9 % 100 mL IVPB  Status:  Discontinued        3 g 200 mL/hr over 30 Minutes Intravenous Every 6 hours 07/18/22 0922 07/18/22 1125   07/18/22 1000  oseltamivir (TAMIFLU) 6 MG/ML suspension 75 mg        75 mg Per Tube 2 times daily 07/18/22 0910 07/22/22 0959   07/17/22 1800  vancomycin (VANCOREADY) IVPB 1250 mg/250 mL  Status:  Discontinued        1,250 mg 166.7 mL/hr over 90 Minutes Intravenous Every 8 hours 07/17/22 1018 07/17/22 1527   07/17/22 1530  cefTRIAXone (ROCEPHIN) 2 g in sodium chloride 0.9 % 100  mL IVPB  Status:  Discontinued        2 g 200 mL/hr over 30 Minutes Intravenous Every 24 hours 07/17/22 1526 07/18/22 0922   07/17/22 1530  azithromycin (ZITHROMAX) 500 mg in sodium chloride 0.9 % 250 mL IVPB  Status:  Discontinued        500  mg 250 mL/hr over 60 Minutes Intravenous Every 24 hours 07/17/22 1526 07/18/22 0922   07/17/22 1200  vancomycin (VANCOREADY) IVPB 1500 mg/300 mL  Status:  Discontinued        1,500 mg 150 mL/hr over 120 Minutes Intravenous 2 times daily 07/17/22 0315 07/17/22 1018   07/17/22 1000  cefTRIAXone (ROCEPHIN) 2 g in sodium chloride 0.9 % 100 mL IVPB  Status:  Discontinued        2 g 200 mL/hr over 30 Minutes Intravenous Every 24 hours 07/17/22 0250 07/17/22 0311   07/17/22 0800  piperacillin-tazobactam (ZOSYN) IVPB 3.375 g  Status:  Discontinued        3.375 g 12.5 mL/hr over 240 Minutes Intravenous Every 8 hours 07/17/22 0315 07/17/22 1526   07/17/22 0400  azithromycin (ZITHROMAX) 500 mg in sodium chloride 0.9 % 250 mL IVPB  Status:  Discontinued        500 mg 250 mL/hr over 60 Minutes Intravenous Daily 07/17/22 0250 07/17/22 0311   07/17/22 0400  oseltamivir (TAMIFLU) capsule 75 mg  Status:  Discontinued        75 mg Oral 2 times daily 07/17/22 0329 07/18/22 0910   07/17/22 0145  vancomycin (VANCOREADY) IVPB 1500 mg/300 mL        1,500 mg 150 mL/hr over 120 Minutes Intravenous  Once 07/17/22 0114 07/17/22 0731   07/17/22 0115  piperacillin-tazobactam (ZOSYN) IVPB 3.375 g        3.375 g 100 mL/hr over 30 Minutes Intravenous  Once 07/17/22 0112 07/17/22 0222       Subjective: Patient seen and evaluated today with no new acute complaints or concerns. No acute concerns or events noted overnight.  He is doing well and has had breakfast this morning.   Objective: Vitals:   07/20/22 0000 07/20/22 0034 07/20/22 0349 07/20/22 0738  BP: 122/81 123/79 130/70   Pulse: 90 91 (!) 106   Resp: (!) '23 20 20   '$ Temp:  98.1 F (36.7 C) 98 F (36.7 C)   TempSrc:  Oral Oral   SpO2: 100% 99% 99% 95%  Weight:      Height:        Intake/Output Summary (Last 24 hours) at 07/20/2022 0826 Last data filed at 07/20/2022 0400 Gross per 24 hour  Intake 401.38 ml  Output 525 ml  Net -123.62 ml   Filed  Weights   07/16/22 2213 07/17/22 1832 07/18/22 0424  Weight: 91.9 kg 90.7 kg 91.2 kg    Examination:  General exam: Appears calm and comfortable  Respiratory system: Clear to auscultation. Respiratory effort normal.  2 L nasal cannula Cardiovascular system: S1 & S2 heard, RRR.  Gastrointestinal system: Abdomen is soft Central nervous system: Alert and awake Extremities: Significant mass and edema to right lower extremity Skin: No significant lesions noted Psychiatry: Flat affect.    Data Reviewed: I have personally reviewed following labs and imaging studies  CBC: Recent Labs  Lab 07/16/22 2300 07/16/22 2312 07/17/22 0530 07/17/22 0645 07/17/22 0900 07/17/22 1604 07/17/22 1610 07/17/22 1805 07/18/22 0647 07/19/22 0641 07/20/22 0057  WBC 34.3*  --  35.4*  --  37.0*  --  39.4*  --  51.1* 51.9* 21.7*  NEUTROABS 29.5*  --  30.4*  --   --   --   --   --   --   --   --   HGB 11.7*   < > 10.6*   < > 10.3*   < > 9.8* 10.2* 10.0* 10.2* 9.4*  HCT 34.7*   < > 31.0*   < > 30.3*   < > 30.8* 30.0* 28.3* 30.8* 29.2*  MCV 109.1*  --  109.5*  --  109.0*  --  114.9*  --  107.2* 110.0* 113.2*  PLT 595*  --  499*  --  451*  --  427*  --  480* 580* 425*   < > = values in this interval not displayed.   Basic Metabolic Panel: Recent Labs  Lab 07/16/22 2300 07/16/22 2312 07/16/22 2312 07/17/22 0530 07/17/22 0645 07/17/22 0900 07/17/22 1604 07/17/22 1610 07/17/22 1805 07/18/22 0647 07/18/22 1709 07/19/22 0641  NA 129* 128*  --  128* 127* 129* 136  --  134* 134*  --   --   K 4.0 4.0  --  3.3* 3.4* 3.3* 3.6  --  3.9 3.7  --   --   CL 92* 92*  --  96*  --  97*  --   --   --  102  --   --   CO2 26  --   --  22  --  24  --   --   --  25  --   --   GLUCOSE 129* 130*  --  147*  --  118*  --   --   --  147*  --   --   BUN 14 14  --  12  --  10  --   --   --  15  --   --   CREATININE 0.96 0.80  --  0.84  --  0.71  --  0.61  --  0.66  --   --   CALCIUM 11.1*  --   --  9.7  --  9.7  --    --   --  10.2  --   --   MG  --   --    < > 1.3*  --  1.3*  --  1.5*  --  1.5* 2.3 2.1  PHOS  --   --    < > 2.4*  --  2.2*  --  3.6  --  2.7 1.7* 3.1   < > = values in this interval not displayed.   GFR: Estimated Creatinine Clearance: 145.2 mL/min (by C-G formula based on SCr of 0.66 mg/dL). Liver Function Tests: Recent Labs  Lab 07/17/22 0530 07/17/22 0900  AST 38  --   ALT 15  --   ALKPHOS 79  --   BILITOT 2.1*  --   PROT 6.9  --   ALBUMIN 2.4* 2.3*   No results for input(s): "LIPASE", "AMYLASE" in the last 168 hours. No results for input(s): "AMMONIA" in the last 168 hours. Coagulation Profile: No results for input(s): "INR", "PROTIME" in the last 168 hours. Cardiac Enzymes: No results for input(s): "CKTOTAL", "CKMB", "CKMBINDEX", "TROPONINI" in the last 168 hours. BNP (last 3 results) No results for input(s): "PROBNP" in the last 8760 hours. HbA1C: Recent Labs    07/17/22 1610  HGBA1C 4.6*   CBG: Recent Labs  Lab 07/19/22 1109 07/19/22 1620 07/19/22 2018 07/19/22  2359 07/20/22 0621  GLUCAP 117* 86 107* 96 80   Lipid Profile: Recent Labs    07/18/22 0647  TRIG 109   Thyroid Function Tests: No results for input(s): "TSH", "T4TOTAL", "FREET4", "T3FREE", "THYROIDAB" in the last 72 hours. Anemia Panel: No results for input(s): "VITAMINB12", "FOLATE", "FERRITIN", "TIBC", "IRON", "RETICCTPCT" in the last 72 hours. Sepsis Labs: Recent Labs  Lab 07/17/22 0050 07/17/22 0900  LATICACIDVEN 1.8 1.1    Recent Results (from the past 240 hour(s))  Resp panel by RT-PCR (RSV, Flu A&B, Covid) Anterior Nasal Swab     Status: Abnormal   Collection Time: 07/17/22 12:50 AM   Specimen: Anterior Nasal Swab  Result Value Ref Range Status   SARS Coronavirus 2 by RT PCR NEGATIVE NEGATIVE Final    Comment: (NOTE) SARS-CoV-2 target nucleic acids are NOT DETECTED.  The SARS-CoV-2 RNA is generally detectable in upper respiratory specimens during the acute phase of  infection. The lowest concentration of SARS-CoV-2 viral copies this assay can detect is 138 copies/mL. A negative result does not preclude SARS-Cov-2 infection and should not be used as the sole basis for treatment or other patient management decisions. A negative result may occur with  improper specimen collection/handling, submission of specimen other than nasopharyngeal swab, presence of viral mutation(s) within the areas targeted by this assay, and inadequate number of viral copies(<138 copies/mL). A negative result must be combined with clinical observations, patient history, and epidemiological information. The expected result is Negative.  Fact Sheet for Patients:  EntrepreneurPulse.com.au  Fact Sheet for Healthcare Providers:  IncredibleEmployment.be  This test is no t yet approved or cleared by the [place] FDA and  has been authorized for detection and/or diagnosis of SARS-CoV-2 by FDA under an Emergency Use Authorization (EUA). This EUA will remain  in effect (meaning this test can be used) for the duration of the COVID-19 declaration under Section 564(b)(1) of the Act, 21 U.S.C.section 360bbb-3(b)(1), unless the authorization is terminated  or revoked sooner.       Influenza A by PCR POSITIVE (A) NEGATIVE Final   Influenza B by PCR NEGATIVE NEGATIVE Final    Comment: (NOTE) The Xpert Xpress SARS-CoV-2/FLU/RSV plus assay is intended as an aid in the diagnosis of influenza from Nasopharyngeal swab specimens and should not be used as a sole basis for treatment. Nasal washings and aspirates are unacceptable for Xpert Xpress SARS-CoV-2/FLU/RSV testing.  Fact Sheet for Patients: EntrepreneurPulse.com.au  Fact Sheet for Healthcare Providers: IncredibleEmployment.be  This test is not yet approved or cleared by the [place] FDA and has been authorized for detection and/or diagnosis of  SARS-CoV-2 by FDA under an Emergency Use Authorization (EUA). This EUA will remain in effect (meaning this test can be used) for the duration of the COVID-19 declaration under Section 564(b)(1) of the Act, 21 U.S.C. section 360bbb-3(b)(1), unless the authorization is terminated or revoked.     Resp Syncytial Virus by PCR NEGATIVE NEGATIVE Final    Comment: (NOTE) Fact Sheet for Patients: EntrepreneurPulse.com.au  Fact Sheet for Healthcare Providers: IncredibleEmployment.be  This test is not yet approved or cleared by the [place] FDA and has been authorized for detection and/or diagnosis of SARS-CoV-2 by FDA under an Emergency Use Authorization (EUA). This EUA will remain in effect (meaning this test can be used) for the duration of the COVID-19 declaration under Section 564(b)(1) of the Act, 21 U.S.C. section 360bbb-3(b)(1), unless the authorization is terminated or revoked.  Performed at [facility], [place]  [address], [place], [place] [address]   Blood culture (routine x 2)     Status: None (Preliminary result)   Collection Time: 07/17/22 12:50 AM   Specimen: BLOOD LEFT FOREARM  Result Value Ref Range Status   Specimen Description BLOOD LEFT FOREARM  Final   Special Requests   Final    BOTTLES DRAWN AEROBIC ONLY Blood Culture results may not be optimal due to an inadequate volume of blood received in culture bottles   Culture   Final    NO GROWTH 3 DAYS Performed at [facility], [place] [address], [place], [place] [address]    Report Status PENDING  Incomplete  Blood culture (routine x 2)     Status: None (Preliminary result)   Collection Time: 07/17/22 12:50 AM   Specimen: BLOOD RIGHT FOREARM  Result Value Ref Range Status   Specimen Description BLOOD RIGHT FOREARM  Final   Special Requests   Final    BOTTLES DRAWN AEROBIC AND ANAEROBIC Blood Culture adequate volume   Culture   Final    NO GROWTH 3 DAYS Performed at  [facility], [place] [address], [place], [place] [address]    Report Status PENDING  Incomplete  MRSA Next Gen by PCR, Nasal     Status: None   Collection Time: 07/17/22  6:50 PM   Specimen: Nasal Mucosa; Nasal Swab  Result Value Ref Range Status   MRSA by PCR Next Gen NOT DETECTED NOT DETECTED Final    Comment: (NOTE) The GeneXpert MRSA Assay (FDA approved for NASAL specimens only), is one component of a comprehensive MRSA colonization surveillance program. It is not intended to diagnose MRSA infection nor to guide or monitor treatment for MRSA infections. Test performance is not FDA approved in patients less than 40 years old. Performed at [facility], [place] [address], [place], [place] [address]          Radiology Studies: CT ABDOMEN PELVIS WO CONTRAST  Result Date: 07/18/2022 CLINICAL DATA:  Innumerable bilateral pulmonary masses compatible with widespread pulmonary metastatic disease on the chest CTA obtained yesterday. The current study is to evaluate for a possible primary source and additional metastatic disease. EXAM: CT ABDOMEN AND PELVIS WITHOUT CONTRAST TECHNIQUE: Multidetector CT imaging of the abdomen and pelvis was performed following the standard protocol without IV contrast. RADIATION DOSE REDUCTION: This exam was performed according to the departmental dose-optimization program which includes automated exposure control, adjustment of the mA and/or kV according to patient size and/or use of iterative reconstruction technique. COMPARISON:  None Available. FINDINGS: Lower chest: A large number of bilateral pulmonary masses and nodules are again demonstrated. Interval dense left lower lobe consolidation. Slightly more confluent tree in bud opacities and patchy opacities associated with cylindrical bronchiectasis at the right lung base. Streak artifacts from the patient's arms with no definite pleural fluid seen. Hepatobiliary: Previously noted changes of cirrhosis of  the liver. Pancreas: Unremarkable. No pancreatic ductal dilatation or surrounding inflammatory changes. Spleen: Normal in size without focal abnormality. Adrenals/Urinary Tract: Foley catheter in the urinary bladder with associated air in the bladder. Mild-to-moderate diffuse bladder wall thickening. Unremarkable adrenal glands, kidneys and ureters. Stomach/Bowel: Nasogastric tube in the stomach. There is some oral contrast in the stomach as well as well as in normal caliber small bowel loops. Minimal sigmoid colon diverticulosis. Otherwise, unremarkable colon and normal-appearing appendix. Vascular/Lymphatic: No significant vascular findings are present. No enlarged abdominal or pelvic lymph nodes. Reproductive: Unremarkable prostate gland. Other: Small to  moderate-sized umbilical hernia containing fat. Musculoskeletal: Minimal lumbar spine degenerative changes. Bilateral L5 pars interarticularis defects with associated minimal grade 1 anterolisthesis at the L5-S1 level. No evidence of bony metastatic disease. IMPRESSION: 1. Interval dense left lower lobe consolidation, compatible with dense atelectasis or pneumonia. 2. Slightly more confluent tree in bud opacities and patchy opacities associated with cylindrical bronchiectasis at the right lung base compatible with a mildly progressive infectious/inflammatory process. 3. Extensive bilateral pulmonary metastases. 4. No evidence of primary malignancy or metastatic disease in the abdomen or pelvis. 5. Stable changes of cirrhosis of the liver. 6. Small to moderate-sized umbilical hernia containing fat. 7. 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 Electronically Signed   By: Claudie Revering M.D.   On: 07/18/2022 23:24        Scheduled Meds:  aspirin  81 mg Per Tube Daily   budesonide (PULMICORT) nebulizer solution  0.25 mg Nebulization BID   colchicine  0.6 mg Per Tube BID   enoxaparin (LOVENOX) injection   90 mg Subcutaneous Q12H   famotidine  20 mg Per Tube BID   folic acid  1 mg Per Tube Daily   insulin aspart  0-9 Units Subcutaneous TID AC & HS   multivitamin with minerals  1 tablet Per Tube Daily   oseltamivir  75 mg Per Tube BID   thiamine  100 mg Per Tube Daily   Or   thiamine  100 mg Intravenous Daily   Continuous Infusions:  sodium chloride 250 mL (07/20/22 0555)   ampicillin-sulbactam (UNASYN) IV 3 g (07/20/22 0559)     LOS: 3 days    Time spent: 35 minutes    Enis Leatherwood Darleen Crocker, DO [facility]  If 7PM-7AM, please contact night-coverage [email or web address] 07/20/2022, 8:26 AM

## 2022-07-21 DIAGNOSIS — J189 Pneumonia, unspecified organism: Secondary | ICD-10-CM | POA: Diagnosis not present

## 2022-07-21 LAB — CBC
HCT: 29.6 % — ABNORMAL LOW (ref 39.0–52.0)
Hemoglobin: 9.4 g/dL — ABNORMAL LOW (ref 13.0–17.0)
MCH: 36.4 pg — ABNORMAL HIGH (ref 26.0–34.0)
MCHC: 31.8 g/dL (ref 30.0–36.0)
MCV: 114.7 fL — ABNORMAL HIGH (ref 80.0–100.0)
Platelets: 394 10*3/uL (ref 150–400)
RBC: 2.58 MIL/uL — ABNORMAL LOW (ref 4.22–5.81)
RDW: 13.5 % (ref 11.5–15.5)
WBC: 19.5 10*3/uL — ABNORMAL HIGH (ref 4.0–10.5)
nRBC: 0 % (ref 0.0–0.2)

## 2022-07-21 LAB — GLUCOSE, CAPILLARY
Glucose-Capillary: 93 mg/dL (ref 70–99)
Glucose-Capillary: 112 mg/dL — ABNORMAL HIGH (ref 70–99)
Glucose-Capillary: 102 mg/dL — ABNORMAL HIGH (ref 70–99)
Glucose-Capillary: 113 mg/dL — ABNORMAL HIGH (ref 70–99)

## 2022-07-21 LAB — BASIC METABOLIC PANEL
Anion gap: 9 (ref 5–15)
BUN: 10 mg/dL (ref 6–20)
CO2: 24 mmol/L (ref 22–32)
Calcium: 9.4 mg/dL (ref 8.9–10.3)
Chloride: 103 mmol/L (ref 98–111)
Creatinine, Ser: 0.74 mg/dL (ref 0.61–1.24)
GFR, Estimated: >60 mL/min (ref >60)
Glucose, Bld: 110 mg/dL — ABNORMAL HIGH (ref 70–99)
Potassium: 2.9 mmol/L — ABNORMAL LOW (ref 3.5–5.1)
Sodium: 136 mmol/L (ref 135–145)

## 2022-07-21 LAB — LEGIONELLA PNEUMOPHILA SEROGP 1 UR AG: L. pneumophila Serogp 1 Ur Ag: NEGATIVE

## 2022-07-21 LAB — MAGNESIUM: Magnesium: 1.3 mg/dL — ABNORMAL LOW (ref 1.7–2.4)

## 2022-07-21 MED ORDER — POTASSIUM CHLORIDE CRYS ER 20 MEQ PO TBCR
40.0000 meq | EXTENDED_RELEASE_TABLET | ORAL | Status: AC
  Administered 2022-07-21 (×3): 40 meq via ORAL
  Filled 2022-07-21 (×3): qty 2

## 2022-07-21 MED ORDER — MAGNESIUM SULFATE 2 GM/50ML IV SOLN
2.0000 g | Freq: Once | INTRAVENOUS | Status: AC
  Administered 2022-07-21: 2 g via INTRAVENOUS
  Filled 2022-07-21: qty 50

## 2022-07-21 NOTE — Progress Notes (Addendum)
PROGRESS NOTE    [name]  NFA:[id number] DOB: 24-Jan-1981 DOA: 07/16/2022 PCP: Patient, No Pcp Per   Brief Narrative:    42 year old male with known right leg muscle mass and DVT presented to the emergency department on July 17, 2022 early in the morning complaining of shortness of breath and cough. Was found to have influenza and pneumonia. Has been treated with Tamiflu and broad-spectrum antibiotics for concern for bacterial superinfection. Has been receiving oxygen, breathing treatments and has had some wheezing. Developed progressive shortness of breath, mental status changes and became minimally responsive. Pulmonary and critical care medicine was consulted for worsening respiratory failure and change in mental status.  CT angiogram on 12/28 demonstrated multiple large masses bilaterally with chronic thromboembolism.  He was subsequently intubated and transferred to ICU.  He was noted to have diffuse ST elevations in 12/29 consistent with acute pericarditis and has been seen by cardiology with initiation of high-dose stress steroids and is now on colchicine.  Plan to wean oxygen further and PT evaluation with recommendation for acute inpatient rehabilitation noted and patient will require placement.  He appears to be tolerating diet.  ? Getting biopsy done in hospital?  Anticipate discharge once rehab facility bed available.  Assessment & Plan:   Principal Problem:   Postobstructive pneumonia Active Problems:   Metastasis to lung (HCC)   Hyponatremia   Chronic pulmonary embolism (HCC)   Liver cirrhosis (HCC)   DVT (deep venous thrombosis) (HCC)   Leg mass, right   Respiratory failure (HCC)   Malnutrition of moderate degree  Assessment and Plan:  Acute hypoxemic respiratory failure secondary to influenza A  and strep pneumo pneumonia-improving -Tamiflu ongoing with isolation precautions -Continues on Unasyn with stop date 1/3 -weaned to RA -continue pulm toilet  Acute  pericarditis secondary to above -Continues on colchicine -will need to reach back out to cards with recommendations once d/c closer  Acute metabolic encephalopathy secondary to above-resolved  Widely metastatic malignancy-unknown primary, 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 No acute concerns or events noted overnight.  He is doing well and has had breakfast this morning.   Objective: Vitals:   07/21/22 0046 07/21/22 0424 07/21/22 0804 07/21/22 0806  BP: 134/79 (!) 133/95  135/89  Pulse: 100 99 (!) 106 (!) 105  Resp: 20 20 (!) 24 (!) 22  Temp: 98.2 F (36.8 C) 98.5 F (36.9 C)  99 F (37.2 C)  TempSrc: Oral Oral  Oral  SpO2: 96% 98% 98% 94%  Weight:      Height:        Intake/Output Summary (Last 24 hours) at 07/21/2022 1221 Last data filed at 07/21/2022 0426 Gross per 24 hour  Intake 240 ml  Output 1000 ml  Net -760 ml   Filed Weights   07/16/22 2213 07/17/22 1832 07/18/22  0424  Weight: 91.9 kg 90.7 kg 91.2 kg    Examination:  General exam: Appears calm and comfortable  Respiratory system: Clear to auscultation. Respiratory effort normal. Off O2 Cardiovascular system: S1 & S2 heard, RRR.  Gastrointestinal  system: Abdomen is soft Central nervous system: Alert and awake Extremities: Significant mass and edema to right lower extremity Skin: No significant lesions noted Psychiatry: Flat affect.    Data Reviewed: I have personally reviewed following labs and imaging studies  CBC: Recent Labs  Lab 07/16/22 2300 07/16/22 2312 07/17/22 0530 07/17/22 0645 07/17/22 1610 07/17/22 1805 07/18/22 0647 07/19/22 0641 07/20/22 0057 07/21/22 0108  WBC 34.3*  --  35.4*   < > 39.4*  --  51.1* 51.9* 21.7* 19.5*  NEUTROABS 29.5*  --  30.4*  --   --   --   --   --   --   --   HGB 11.7*   < > 10.6*   < > 9.8* 10.2* 10.0* 10.2* 9.4* 9.4*  HCT 34.7*   < > 31.0*   < > 30.8* 30.0* 28.3* 30.8* 29.2* 29.6*  MCV 109.1*  --  109.5*   < > 114.9*  --  107.2* 110.0* 113.2* 114.7*  PLT 595*  --  499*   < > 427*  --  480* 580* 425* 394   < > = values in this interval not displayed.   Basic Metabolic Panel: Recent Labs  Lab 07/16/22 2300 07/16/22 2312 07/16/22 2312 07/17/22 0530 07/17/22 0645 07/17/22 0900 07/17/22 1604 07/17/22 1610 07/17/22 1805 07/18/22 0647 07/18/22 1709 07/19/22 0641 07/21/22 0108  NA 129* 128*  --  128*   < > 129* 136  --  134* 134*  --   --  136  K 4.0 4.0  --  3.3*   < > 3.3* 3.6  --  3.9 3.7  --   --  2.9*  CL 92* 92*  --  96*  --  97*  --   --   --  102  --   --  103  CO2 26  --   --  22  --  24  --   --   --  25  --   --  24  GLUCOSE 129* 130*  --  147*  --  118*  --   --   --  147*  --   --  110*  BUN 14 14  --  12  --  10  --   --   --  15  --   --  10  CREATININE 0.96 0.80  --  0.84  --  0.71  --  0.61  --  0.66  --   --  0.74  CALCIUM 11.1*  --   --  9.7  --  9.7  --   --   --  10.2  --   --  9.4  MG  --   --    < > 1.3*  --  1.3*  --  1.5*  --  1.5* 2.3 2.1 1.3*  PHOS  --   --    < > 2.4*  --  2.2*  --  3.6  --  2.7 1.7* 3.1  --    < > = values in this interval not displayed.   GFR: Estimated Creatinine Clearance: 145.2 mL/min (by C-G formula based on SCr of 0.74  mg/dL). Liver Function Tests: Recent Labs  Lab 07/17/22 0530 07/17/22  0900  AST 38  --   ALT 15  --   ALKPHOS 79  --   BILITOT 2.1*  --   PROT 6.9  --   ALBUMIN 2.4* 2.3*   No results for input(s): "LIPASE", "AMYLASE" in the last 168 hours. No results for input(s): "AMMONIA" in the last 168 hours. Coagulation Profile: No results for input(s): "INR", "PROTIME" in the last 168 hours. Cardiac Enzymes: No results for input(s): "CKTOTAL", "CKMB", "CKMBINDEX", "TROPONINI" in the last 168 hours. BNP (last 3 results) No results for input(s): "PROBNP" in the last 8760 hours. HbA1C: No results for input(s): "HGBA1C" in the last 72 hours.  CBG: Recent Labs  Lab 07/19/22 2359 07/20/22 0621 07/20/22 1315 07/20/22 2118 07/21/22 0638  GLUCAP 96 80 103* 113* 93   Lipid Profile: No results for input(s): "CHOL", "HDL", "LDLCALC", "TRIG", "CHOLHDL", "LDLDIRECT" in the last 72 hours.  Thyroid Function Tests: No results for input(s): "TSH", "T4TOTAL", "FREET4", "T3FREE", "THYROIDAB" in the last 72 hours. Anemia Panel: No results for input(s): "VITAMINB12", "FOLATE", "FERRITIN", "TIBC", "IRON", "RETICCTPCT" in the last 72 hours. Sepsis Labs: Recent Labs  Lab 07/17/22 0050 07/17/22 0900  LATICACIDVEN 1.8 1.1    Recent Results (from the past 240 hour(s))  Resp panel by RT-PCR (RSV, Flu A&B, Covid) Anterior Nasal Swab     Status: Abnormal   Collection Time: 07/17/22 12:50 AM   Specimen: Anterior Nasal Swab  Result Value Ref Range Status   SARS Coronavirus 2 by RT PCR NEGATIVE NEGATIVE Final    Comment: (NOTE) SARS-CoV-2 target nucleic acids are NOT DETECTED.  The SARS-CoV-2 RNA is generally detectable in upper respiratory specimens during the acute phase of infection. The lowest concentration of SARS-CoV-2 viral copies this assay can detect is 138 copies/mL. A negative result does not preclude SARS-Cov-2 infection and should not be used as the sole basis for treatment or other  patient management decisions. A negative result may occur with  improper specimen collection/handling, submission of specimen other than nasopharyngeal swab, presence of viral mutation(s) within the areas targeted by this assay, and inadequate number of viral copies(<138 copies/mL). A negative result must be combined with clinical observations, patient history, and epidemiological information. The expected result is Negative.  Fact Sheet for Patients:  EntrepreneurPulse.com.au  Fact Sheet for Healthcare Providers:  IncredibleEmployment.be  This test is no t yet approved or cleared by the [place] FDA and  has been authorized for detection and/or diagnosis of SARS-CoV-2 by FDA under an Emergency Use Authorization (EUA). This EUA will remain  in effect (meaning this test can be used) for the duration of the COVID-19 declaration under Section 564(b)(1) of the Act, 21 U.S.C.section 360bbb-3(b)(1), unless the authorization is terminated  or revoked sooner.       Influenza A by PCR POSITIVE (A) NEGATIVE Final   Influenza B by PCR NEGATIVE NEGATIVE Final    Comment: (NOTE) The Xpert Xpress SARS-CoV-2/FLU/RSV plus assay is intended as an aid in the diagnosis of influenza from Nasopharyngeal swab specimens and should not be used as a sole basis for treatment. Nasal washings and aspirates are unacceptable for Xpert Xpress SARS-CoV-2/FLU/RSV testing.  Fact Sheet for Patients: EntrepreneurPulse.com.au  Fact Sheet for Healthcare Providers: IncredibleEmployment.be  This test is not yet approved or cleared by the [place] FDA and has been authorized for detection and/or diagnosis of SARS-CoV-2 by FDA under an Emergency Use Authorization (EUA). This EUA will remain in effect (meaning this test can be used) for the duration of  the COVID-19 declaration under Section 564(b)(1) of the Act, 21 U.S.C. section  360bbb-3(b)(1), unless the authorization is terminated or revoked.     Resp Syncytial Virus by PCR NEGATIVE NEGATIVE Final    Comment: (NOTE) Fact Sheet for Patients: EntrepreneurPulse.com.au  Fact Sheet for Healthcare Providers: IncredibleEmployment.be  This test is not yet approved or cleared by the [place] FDA and has been authorized for detection and/or diagnosis of SARS-CoV-2 by FDA under an Emergency Use Authorization (EUA). This EUA will remain in effect (meaning this test can be used) for the duration of the COVID-19 declaration under Section 564(b)(1) of the Act, 21 U.S.C. section 360bbb-3(b)(1), unless the authorization is terminated or revoked.  Performed at [facility], [place] [address], [place], [place] [address]   Blood culture (routine x 2)     Status: None (Preliminary result)   Collection Time: 07/17/22 12:50 AM   Specimen: BLOOD LEFT FOREARM  Result Value Ref Range Status   Specimen Description BLOOD LEFT FOREARM  Final   Special Requests   Final    BOTTLES DRAWN AEROBIC ONLY Blood Culture results may not be optimal due to an inadequate volume of blood received in culture bottles   Culture   Final    NO GROWTH 4 DAYS Performed at [facility], [place] [address] [place], [place] [address]    Report Status PENDING  Incomplete  Blood culture (routine x 2)     Status: None (Preliminary result)   Collection Time: 07/17/22 12:50 AM   Specimen: BLOOD RIGHT FOREARM  Result Value Ref Range Status   Specimen Description BLOOD RIGHT FOREARM  Final   Special Requests   Final    BOTTLES DRAWN AEROBIC AND ANAEROBIC Blood Culture adequate volume   Culture   Final    NO GROWTH 4 DAYS Performed at [facility], [place] [address] [place], [place] [address]    Report Status PENDING  Incomplete  MRSA Next Gen by PCR, Nasal     Status: None   Collection Time: 07/17/22  6:50 PM   Specimen: Nasal Mucosa; Nasal Swab   Result Value Ref Range Status   MRSA by PCR Next Gen NOT DETECTED NOT DETECTED Final    Comment: (NOTE) The GeneXpert MRSA Assay (FDA approved for NASAL specimens only), is one component of a comprehensive MRSA colonization surveillance program. It is not intended to diagnose MRSA infection nor to guide or monitor treatment for MRSA infections. Test performance is not FDA approved in patients less than 37 years old. Performed at [facility], [place] [address], [place], [place] [address]          Radiology Studies: No results found.      Scheduled Meds:  aspirin  81 mg Oral Daily   budesonide (PULMICORT) nebulizer solution  0.25 mg Nebulization BID   colchicine  0.6 mg Oral BID   enoxaparin (LOVENOX) injection  90 mg Subcutaneous Q12H   famotidine  20 mg Oral BID   folic acid  1 mg Oral Daily   insulin aspart  0-9 Units Subcutaneous TID AC & HS   multivitamin with minerals  1 tablet Oral Daily   potassium chloride  40 mEq Oral Q4H   thiamine  100 mg Oral Daily   Or   thiamine  100 mg Intravenous Daily   Continuous Infusions:  sodium chloride 250 mL (07/20/22 0555)   ampicillin-sulbactam (UNASYN) IV 3 g (07/21/22 1121)   magnesium sulfate bolus  IVPB 2 g (07/21/22 1129)     LOS: 4 days    Time spent: 35 minutes    Geradine Girt, DO [facility]  If 7PM-7AM, please contact night-coverage [email or web address] 07/21/2022, 12:21 PM

## 2022-07-21 NOTE — TOC Progression Note (Signed)
Transition of Care [facility]) - Progression Note    Patient Details  Name: [name] MRN: [id number] Date of Birth: 01-26-1981  Transition of Care [facility]) CM/SW [place], [place] Phone Number: 07/21/2022, 9:34 AM  Clinical Narrative:     CSW spoke with patient's sister, inquired about resources and insurance for the patient. CSW provided family with [facility] DSS contact information.   Thurmond Butts, MSW, LCSW Clinical Social Worker                                                     Social Determinants of Health (SDOH) Interventions SDOH Screenings   Tobacco Use: Low Risk  (07/19/2022)    Readmission Risk Interventions     No data to display

## 2022-07-22 ENCOUNTER — Inpatient Hospital Stay (HOSPITAL_COMMUNITY): Payer: BC Managed Care – PPO

## 2022-07-22 DIAGNOSIS — J189 Pneumonia, unspecified organism: Secondary | ICD-10-CM | POA: Diagnosis not present

## 2022-07-22 LAB — CBC
HCT: 31.4 % — ABNORMAL LOW (ref 39.0–52.0)
Hemoglobin: 10.5 g/dL — ABNORMAL LOW (ref 13.0–17.0)
MCH: 37.1 pg — ABNORMAL HIGH (ref 26.0–34.0)
MCHC: 33.4 g/dL (ref 30.0–36.0)
MCV: 111 fL — ABNORMAL HIGH (ref 80.0–100.0)
Platelets: 389 10*3/uL (ref 150–400)
RBC: 2.83 MIL/uL — ABNORMAL LOW (ref 4.22–5.81)
RDW: 14.2 % (ref 11.5–15.5)
WBC: 20.9 10*3/uL — ABNORMAL HIGH (ref 4.0–10.5)
nRBC: 0 % (ref 0.0–0.2)

## 2022-07-22 LAB — BASIC METABOLIC PANEL
Anion gap: 8 (ref 5–15)
BUN: 7 mg/dL (ref 6–20)
CO2: 23 mmol/L (ref 22–32)
Calcium: 9.5 mg/dL (ref 8.9–10.3)
Chloride: 103 mmol/L (ref 98–111)
Creatinine, Ser: 0.68 mg/dL (ref 0.61–1.24)
GFR, Estimated: >60 mL/min (ref >60)
Glucose, Bld: 101 mg/dL — ABNORMAL HIGH (ref 70–99)
Potassium: 4 mmol/L (ref 3.5–5.1)
Sodium: 134 mmol/L — ABNORMAL LOW (ref 135–145)

## 2022-07-22 LAB — GLUCOSE, CAPILLARY
Glucose-Capillary: 214 mg/dL — ABNORMAL HIGH (ref 70–99)
Glucose-Capillary: 88 mg/dL (ref 70–99)
Glucose-Capillary: 89 mg/dL (ref 70–99)
Glucose-Capillary: 132 mg/dL — ABNORMAL HIGH (ref 70–99)
Glucose-Capillary: 148 mg/dL — ABNORMAL HIGH (ref 70–99)

## 2022-07-22 LAB — CULTURE, BLOOD (ROUTINE X 2)
Culture: NO GROWTH
Culture: NO GROWTH
Special Requests: ADEQUATE

## 2022-07-22 LAB — MAGNESIUM: Magnesium: 1.6 mg/dL — ABNORMAL LOW (ref 1.7–2.4)

## 2022-07-22 MED ORDER — LIDOCAINE HCL (PF) 1 % IJ SOLN
INTRAMUSCULAR | Status: AC
  Filled 2022-07-22: qty 30

## 2022-07-22 MED ORDER — IPRATROPIUM-ALBUTEROL 0.5-2.5 (3) MG/3ML IN SOLN: 3.0000 mL | RESPIRATORY_TRACT | Status: DC | PRN

## 2022-07-22 MED ORDER — MIDAZOLAM HCL 2 MG/2ML IJ SOLN
INTRAMUSCULAR | Status: AC | PRN
  Administered 2022-07-22: 1 mg via INTRAVENOUS

## 2022-07-22 MED ORDER — FENTANYL CITRATE (PF) 100 MCG/2ML IJ SOLN
INTRAMUSCULAR | Status: AC
  Filled 2022-07-22: qty 2

## 2022-07-22 MED ORDER — MAGNESIUM SULFATE 2 GM/50ML IV SOLN
2.0000 g | Freq: Once | INTRAVENOUS | Status: AC
  Administered 2022-07-22: 2 g via INTRAVENOUS
  Filled 2022-07-22: qty 50

## 2022-07-22 MED ORDER — LIDOCAINE HCL (PF) 1 % IJ SOLN
2.0000 mL | Freq: Once | INTRAMUSCULAR | Status: AC
  Administered 2022-07-22: 2 mL via INTRADERMAL

## 2022-07-22 MED ORDER — ENOXAPARIN SODIUM 100 MG/ML IJ SOSY
90.0000 mg | PREFILLED_SYRINGE | Freq: Two times a day (BID) | INTRAMUSCULAR | Status: DC
  Administered 2022-07-23 – 2022-07-30 (×15): 90 mg via SUBCUTANEOUS
  Filled 2022-07-22 (×15): qty 1

## 2022-07-22 MED ORDER — MIDAZOLAM HCL 2 MG/2ML IJ SOLN
INTRAMUSCULAR | Status: AC
  Filled 2022-07-22: qty 2

## 2022-07-22 MED ORDER — FENTANYL CITRATE (PF) 100 MCG/2ML IJ SOLN
INTRAMUSCULAR | Status: AC | PRN
  Administered 2022-07-22: 50 ug via INTRAVENOUS
  Administered 2022-07-22: 25 ug via INTRAVENOUS

## 2022-07-22 NOTE — Plan of Care (Signed)
  Problem: Clinical Measurements: Goal: Respiratory complications will improve Outcome: Progressing   

## 2022-07-22 NOTE — Progress Notes (Signed)
PROGRESS NOTE    [name]  LZJ:[id number] DOB: Apr 06, 1981 DOA: 07/16/2022 PCP: Patient, No Pcp Per   Brief Narrative:    42 year old male with known right leg muscle mass and DVT presented to the emergency department on July 17, 2022 early in the morning complaining of shortness of breath and cough. Was found to have influenza and pneumonia. Has been treated with Tamiflu and broad-spectrum antibiotics for concern for bacterial superinfection. Has been receiving oxygen, breathing treatments and has had some wheezing. Developed progressive shortness of breath, mental status changes and became minimally responsive. Pulmonary and critical care medicine was consulted for worsening respiratory failure and change in mental status.  CT angiogram on 12/28 demonstrated multiple large masses bilaterally with chronic thromboembolism.  He was subsequently intubated and transferred to ICU.  He was noted to have diffuse ST elevations in 12/29 consistent with acute pericarditis and has been seen by cardiology with initiation of high-dose stress steroids and is now on colchicine.  Plan to wean oxygen further and PT evaluation with recommendation for acute inpatient rehabilitation noted and patient will require placement.  He appears to be tolerating diet.  ? Getting biopsy done in hospital?  Anticipate discharge once rehab facility bed available.  Assessment & Plan:   Principal Problem:   Postobstructive pneumonia Active Problems:   Metastasis to lung (HCC)   Hyponatremia   Chronic pulmonary embolism (HCC)   Liver cirrhosis (HCC)   DVT (deep venous thrombosis) (HCC)   Leg mass, right   Respiratory failure (HCC)   Malnutrition of moderate degree  Assessment and Plan:  Acute hypoxemic respiratory failure secondary to influenza A  and strep pneumo pneumonia-improving -Tamiflu ongoing with isolation precautions -Continues on Unasyn with stop date 1/3 -weaned to RA -continue pulm toilet -PRN  nebs  Acute pericarditis secondary to above -Continues on colchicine x 1 month -outpatient cards follow up  Acute metabolic encephalopathy secondary to above-resolved  Widely metastatic malignancy-unknown primary, 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  -Continue full dose Lovenox- change to NOAC once plan for biopsy complete and no need for further surgeries   DVT prophylaxis: Full dose Lovenox Code Status: Full Family Communication:  at bedside 1/1 Disposition Plan:  Status is: Inpatient Remains inpatient appropriate because: Need for ongoing IV medications   Consultants:  Cardiology PCCM IR CIR  Procedures:  Intubation 12/20     Subjective: hungry  Objective: Vitals:   07/22/22 0012 07/22/22 0358 07/22/22 0500 07/22/22 0756  BP: 135/86 124/75  134/83  Pulse: 100 100    Resp: '20 18  19  '$ Temp: 99.1 F (37.3 C) 98.9 F (37.2 C)  99.6 F (37.6 C)  TempSrc: Oral Oral  Oral  SpO2: 99% 100%  96%  Weight:   91.3 kg   Height:        Intake/Output Summary (Last 24 hours) at 07/22/2022 1209 Last data filed at 07/22/2022 0359 Gross per 24 hour  Intake 874.58 ml  Output 2500 ml  Net -1625.42 ml   Filed Weights   07/17/22 1832 07/18/22 0424 07/22/22 0500  Weight: 90.7 kg 91.2 kg 91.3 kg    Examination:   General: Appearance:  Overweight male in no acute distress     Lungs:     respirations unlabored  Heart:    Tachycardic. Normal rhythm. No murmurs, rubs, or gallops.   MS:   All extremities are intact.   Neurologic:   Awake, alert       Data Reviewed: I have  personally reviewed following labs and imaging studies  CBC: Recent Labs  Lab 07/16/22 2300 07/16/22 2312 07/17/22 0530 07/17/22 0645 07/18/22 0647 07/19/22 0641 07/20/22 0057 07/21/22 0108 07/22/22 0130  WBC 34.3*  --  35.4*   < > 51.1* 51.9* 21.7* 19.5* 20.9*  NEUTROABS 29.5*  --  30.4*  --   --   --   --   --   --   HGB 11.7*   < > 10.6*   < > 10.0* 10.2* 9.4* 9.4* 10.5*  HCT 34.7*   < > 31.0*   < > 28.3* 30.8* 29.2* 29.6* 31.4*  MCV 109.1*  --  109.5*   < > 107.2* 110.0* 113.2* 114.7* 111.0*  PLT 595*  --  499*   < > 480* 580* 425* 394 389   < > = values in this interval not displayed.   Basic Metabolic Panel: Recent Labs  Lab 07/17/22 0530 07/17/22 0645 07/17/22 0900 07/17/22 1604 07/17/22 1610 07/17/22 1805 07/18/22 0647 07/18/22 1709 07/19/22 0641 07/21/22 0108 07/22/22 0130  NA 128*   < > 129* 136  --  134* 134*  --   --  136 134*  K 3.3*   < > 3.3* 3.6  --  3.9 3.7  --   --  2.9* 4.0  CL 96*  --  97*  --   --   --  102  --   --  103 103  CO2 22  --  24  --   --   --  25  --   --  24 23  GLUCOSE 147*  --  118*  --   --   --  147*  --   --  110* 101*  BUN 12  --  10  --   --   --  15  --   --  10 7  CREATININE 0.84  --  0.71  --  0.61  --  0.66  --   --  0.74 0.68  CALCIUM 9.7  --  9.7  --   --   --  10.2  --   --  9.4 9.5  MG 1.3*  --  1.3*  --  1.5*  --  1.5* 2.3 2.1 1.3* 1.6*  PHOS 2.4*  --  2.2*  --  3.6  --  2.7 1.7* 3.1  --   --    < > = values in this interval not displayed.   GFR: Estimated Creatinine Clearance: 145.2 mL/min (by C-G formula based on SCr of 0.68 mg/dL). Liver Function Tests: Recent Labs  Lab 07/17/22 0530 07/17/22 0900  AST 38  --   ALT 15  --   ALKPHOS 79  --   BILITOT 2.1*  --   PROT 6.9  --   ALBUMIN 2.4* 2.3*   No results for input(s): "LIPASE", "AMYLASE" in the last 168 hours. No results for input(s): "AMMONIA" in the last 168 hours. Coagulation Profile: No results for input(s): "INR", "PROTIME" in the last 168  hours. Cardiac Enzymes: No results for input(s): "CKTOTAL", "CKMB", "CKMBINDEX", "TROPONINI" in the last 168 hours. BNP (last 3 results)  No results for input(s): "PROBNP" in the last 8760 hours. HbA1C: No results for input(s): "HGBA1C" in the last 72 hours.  CBG: Recent Labs  Lab 07/21/22 0638 07/21/22 1258 07/21/22 1621 07/21/22 2248 07/22/22 0614  GLUCAP 93 112* 102* 113* 88   Lipid Profile: No results for input(s): "CHOL", "HDL", "LDLCALC", "TRIG", "CHOLHDL", "LDLDIRECT" in the last 72 hours.  Thyroid Function Tests: No results for input(s): "TSH", "T4TOTAL", "FREET4", "T3FREE", "THYROIDAB" in the last 72 hours. Anemia Panel: No results for input(s): "VITAMINB12", "FOLATE", "FERRITIN", "TIBC", "IRON", "RETICCTPCT" in the last 72 hours. Sepsis Labs: Recent Labs  Lab 07/17/22 0050 07/17/22 0900  LATICACIDVEN 1.8 1.1    Recent Results (from the past 240 hour(s))  Resp panel by RT-PCR (RSV, Flu A&B, Covid) Anterior Nasal Swab     Status: Abnormal   Collection Time: 07/17/22 12:50 AM   Specimen: Anterior Nasal Swab  Result Value Ref Range Status   SARS Coronavirus 2 by RT PCR NEGATIVE NEGATIVE Final    Comment: (NOTE) SARS-CoV-2 target nucleic acids are NOT DETECTED.  The SARS-CoV-2 RNA is generally detectable in upper respiratory specimens during the acute phase of infection. The lowest concentration of SARS-CoV-2 viral copies this assay can detect is 138 copies/mL. A negative result does not preclude SARS-Cov-2 infection and should not be used as the sole basis for treatment or other patient management decisions. A negative result may occur with  improper specimen collection/handling, submission of specimen other than nasopharyngeal swab, presence of viral mutation(s) within the areas targeted by this assay, and inadequate number of viral copies(<138 copies/mL). A negative result must be combined with clinical observations, patient history, and  epidemiological information. The expected result is Negative.  Fact Sheet for Patients:  EntrepreneurPulse.com.au  Fact Sheet for Healthcare Providers:  IncredibleEmployment.be  This test is no t yet approved or cleared by the [place] FDA and  has been authorized for detection and/or diagnosis of SARS-CoV-2 by FDA under an Emergency Use Authorization (EUA). This EUA will remain  in effect (meaning this test can be used) for the duration of the COVID-19 declaration under Section 564(b)(1) of the Act, 21 U.S.C.section 360bbb-3(b)(1), unless the authorization is terminated  or revoked sooner.       Influenza A by PCR POSITIVE (A) NEGATIVE Final   Influenza B by PCR NEGATIVE NEGATIVE Final    Comment: (NOTE) The Xpert Xpress SARS-CoV-2/FLU/RSV plus assay is intended as an aid in the diagnosis of influenza from Nasopharyngeal swab specimens and should not be used as a sole basis for treatment. Nasal washings and aspirates are unacceptable for Xpert Xpress SARS-CoV-2/FLU/RSV testing.  Fact Sheet for Patients: EntrepreneurPulse.com.au  Fact Sheet for Healthcare Providers: IncredibleEmployment.be  This test is not yet approved or cleared by the [place] FDA and has been authorized for detection and/or diagnosis of SARS-CoV-2 by FDA under an Emergency Use Authorization (EUA). This EUA will remain in effect (meaning this test can be used) for the duration of the COVID-19 declaration under Section 564(b)(1) of the Act, 21 U.S.C. section 360bbb-3(b)(1), unless the authorization is terminated or revoked.     Resp Syncytial Virus by PCR NEGATIVE NEGATIVE Final    Comment: (NOTE) Fact Sheet for Patients: EntrepreneurPulse.com.au  Fact Sheet for Healthcare Providers: IncredibleEmployment.be  This test is not yet approved or cleared by the [place] FDA and has  been authorized for detection and/or diagnosis of SARS-CoV-2 by FDA under an Emergency Use Authorization (EUA). This EUA will remain in effect (meaning this  test can be used) for the duration of the COVID-19 declaration under Section 564(b)(1) of the Act, 21 U.S.C. section 360bbb-3(b)(1), unless the authorization is terminated or revoked.  Performed at [facility], [place] [address], [place], [place] [address]   Blood culture (routine x 2)     Status: None   Collection Time: 07/17/22 12:50 AM   Specimen: BLOOD LEFT FOREARM  Result Value Ref Range Status   Specimen Description BLOOD LEFT FOREARM  Final   Special Requests   Final    BOTTLES DRAWN AEROBIC ONLY Blood Culture results may not be optimal due to an inadequate volume of blood received in culture bottles   Culture   Final    NO GROWTH 5 DAYS Performed at [facility], [place] [address], [place], [place] [address]    Report Status 07/22/2022 FINAL  Final  Blood culture (routine x 2)     Status: None   Collection Time: 07/17/22 12:50 AM   Specimen: BLOOD RIGHT FOREARM  Result Value Ref Range Status   Specimen Description BLOOD RIGHT FOREARM  Final   Special Requests   Final    BOTTLES DRAWN AEROBIC AND ANAEROBIC Blood Culture adequate volume   Culture   Final    NO GROWTH 5 DAYS Performed at [facility], [place] [address], [place], [place] [address]    Report Status 07/22/2022 FINAL  Final  MRSA Next Gen by PCR, Nasal     Status: None   Collection Time: 07/17/22  6:50 PM   Specimen: Nasal Mucosa; Nasal Swab  Result Value Ref Range Status   MRSA by PCR Next Gen NOT DETECTED NOT DETECTED Final    Comment: (NOTE) The GeneXpert MRSA Assay (FDA approved for NASAL specimens only), is one component of a comprehensive MRSA colonization surveillance program. It is not intended to diagnose MRSA infection nor to guide or monitor treatment for MRSA infections. Test performance is not FDA approved in patients less than  80 years old. Performed at [facility], [place] [address] [place],  [address]          Radiology Studies: No results found.      Scheduled Meds:  aspirin  81 mg Oral Daily   budesonide (PULMICORT) nebulizer solution  0.25 mg Nebulization BID   colchicine  0.6 mg Oral BID   [START ON 07/23/2022] enoxaparin (LOVENOX) injection  90 mg Subcutaneous Q12H   famotidine  20 mg Oral BID   folic acid  1 mg Oral Daily   insulin aspart  0-9 Units Subcutaneous TID AC & HS   multivitamin with minerals  1 tablet Oral Daily   thiamine  100 mg Oral Daily   Or   thiamine  100 mg Intravenous Daily   Continuous Infusions:  sodium chloride 250 mL (07/20/22 0555)   ampicillin-sulbactam (UNASYN) IV 3 g (07/22/22 0536)   magnesium sulfate bolus IVPB       LOS: 5 days    Time spent: 35 minutes    Geradine Girt, DO [facility]  If 7PM-7AM, please contact night-coverage [email or web address] 07/22/2022, 12:09 PM

## 2022-07-22 NOTE — Progress Notes (Signed)
Progress Note  Patient Name: [name] Date of Encounter: 07/22/2022  Primary Cardiologist:   None   Subjective   Denies SOB or chest pain.  Inpatient Medications    Scheduled Meds:  aspirin  81 mg Oral Daily   budesonide (PULMICORT) nebulizer solution  0.25 mg Nebulization BID   colchicine  0.6 mg Oral BID   [START ON 07/23/2022] enoxaparin (LOVENOX) injection  90 mg Subcutaneous Q12H   famotidine  20 mg Oral BID   folic acid  1 mg Oral Daily   insulin aspart  0-9 Units Subcutaneous TID AC & HS   multivitamin with minerals  1 tablet Oral Daily   thiamine  100 mg Oral Daily   Or   thiamine  100 mg Intravenous Daily   Continuous Infusions:  sodium chloride 250 mL (07/20/22 0555)   ampicillin-sulbactam (UNASYN) IV 3 g (07/22/22 0536)   PRN Meds: acetaminophen, ipratropium-albuterol, mouth rinse, polyethylene glycol   Vital Signs    Vitals:   07/22/22 0012 07/22/22 0358 07/22/22 0500 07/22/22 0756  BP: 135/86 124/75  134/83  Pulse: 100 100    Resp: '20 18  19  '$ Temp: 99.1 F (37.3 C) 98.9 F (37.2 C)  99.6 F (37.6 C)  TempSrc: Oral Oral  Oral  SpO2: 99% 100%  96%  Weight:   91.3 kg   Height:        Intake/Output Summary (Last 24 hours) at 07/22/2022 1129 Last data filed at 07/22/2022 0359 Gross per 24 hour  Intake 874.58 ml  Output 2500 ml  Net -1625.42 ml   Filed Weights   07/17/22 1832 07/18/22 0424 07/22/22 0500  Weight: 90.7 kg 91.2 kg 91.3 kg    Telemetry    NSR - Personally Reviewed  ECG    NA - Personally Reviewed  Physical Exam   GEN: No acute distress.   Neck: No  JVD Cardiac: RRR, no murmurs, rubs, or gallops.  Respiratory: Clear  to auscultation bilaterally. GI: Soft, nontender, non-distended  MS: No  edema; No deformity. Neuro:  Nonfocal  Psych: Normal affect   Labs    Chemistry Recent Labs  Lab 07/17/22 0530 07/17/22 0645 07/17/22 0900 07/17/22 1604 07/18/22 0647 07/21/22 0108 07/22/22 0130  NA 128*   < > 129*    < > 134* 136 134*  K 3.3*   < > 3.3*   < > 3.7 2.9* 4.0  CL 96*  --  97*  --  102 103 103  CO2 22  --  24  --  '25 24 23  '$ GLUCOSE 147*  --  118*  --  147* 110* 101*  BUN 12  --  10  --  '15 10 7  '$ CREATININE 0.84  --  0.71   < > 0.66 0.74 0.68  CALCIUM 9.7  --  9.7  --  10.2 9.4 9.5  PROT 6.9  --   --   --   --   --   --   ALBUMIN 2.4*  --  2.3*  --   --   --   --   AST 38  --   --   --   --   --   --   ALT 15  --   --   --   --   --   --   ALKPHOS 79  --   --   --   --   --   --   BILITOT  2.1*  --   --   --   --   --   --   GFRNONAA >60  --  >60   < > >60 >60 >60  ANIONGAP 10  --  8  --  '7 9 8   '$ < > = values in this interval not displayed.     Hematology Recent Labs  Lab 07/20/22 0057 07/21/22 0108 07/22/22 0130  WBC 21.7* 19.5* 20.9*  RBC 2.58* 2.58* 2.83*  HGB 9.4* 9.4* 10.5*  HCT 29.2* 29.6* 31.4*  MCV 113.2* 114.7* 111.0*  MCH 36.4* 36.4* 37.1*  MCHC 32.2 31.8 33.4  RDW 13.6 13.5 14.2  PLT 425* 394 389    Cardiac EnzymesNo results for input(s): "TROPONINI" in the last 168 hours. No results for input(s): "TROPIPOC" in the last 168 hours.   BNPNo results for input(s): "BNP", "PROBNP" in the last 168 hours.   DDimer No results for input(s): "DDIMER" in the last 168 hours.   Radiology    No results found.  Cardiac Studies   Echo:    1. Left ventricular ejection fraction, by estimation, is 50 to 55%. The  left ventricle has low normal function. The left ventricle has no regional  wall motion abnormalities. Left ventricular diastolic parameters were  normal.   2. Right ventricular systolic function is low normal. The right  ventricular size is normal. There is normal pulmonary artery systolic  pressure.   3. The mitral valve is normal in structure. No evidence of mitral valve  regurgitation. No evidence of mitral stenosis.   4. The aortic valve is tricuspid. Aortic valve regurgitation is not  visualized. No aortic stenosis is present.   5. The inferior vena  cava is normal in size with greater than 50%  respiratory variability, suggesting right atrial pressure of 3 mmHg.   Patient Profile     42 y.o. 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    Assessment & Plan    Pericarditis:   Low normal EF as above.    My suggestion would be at least one month of colchicine for at least one month with most authors suggesting 3 months.  I do not think inflammatory markers will be helpful to follow in this situation given the other comorbid issues (clot and tumors).  We can reassess at one month at discharge discuss further therapies.  Given the absence of chest pain I would not suggest NSAIDs.    For questions or updates, please contact [place] Please consult [email or web address] for contact info under Cardiology/STEMI.   Signed, Minus Breeding, MD  07/22/2022, 11:29 AM

## 2022-07-22 NOTE — Progress Notes (Signed)
Physical Therapy Treatment Patient Details Name: [name] MRN: [id number] DOB: 08-09-1980 Today's Date: 07/22/2022   History of Present Illness 42 y.o. male presents to [facility] hospital on 07/17/2022 with SOB and cough, found to have flu and PNA. Pt developed AMS and required intubation on 10/28, extubated 10/30. PMH includes RLE mass and DVT, epistaxis, and tooth infection.    PT Comments    Patient progressing well towards PT goals. Session focused on progressive ambulation and functional mobility. Requires Min guard assist for transfers and gait training today with use of RW for support. Able to walk short distances without DME in the room with mild unsteadiness but no overt LOB. HR up to 128 bpm with activity with mild 2/4 DOE. Encouraged increasing activity and walking to the bathroom daily with staff. Noted to have large amount of swelling in distal RLE from calf and into foot which pt reports happens every few months and then goes away. Encouraged elevation and ankle pumps. Will continue to follow.    Recommendations for follow up therapy are one component of a multi-disciplinary discharge planning process, led by the attending physician.  Recommendations may be updated based on patient status, additional functional criteria and insurance authorization.  Follow Up Recommendations  Acute inpatient rehab (3hours/day)     Assistance Recommended at Discharge Intermittent Supervision/Assistance  Patient can return home with the following A little help with walking and/or transfers;A little help with bathing/dressing/bathroom;Assistance with cooking/housework;Direct supervision/assist for medications management;Assist for transportation;Help with stairs or ramp for entrance   Equipment Recommendations  Rolling walker (2 wheels)    Recommendations for Other Services       Precautions / Restrictions Precautions Precautions: Fall;Other (comment) Precaution Comments: swelling in  RLE Restrictions Weight Bearing Restrictions: No     Mobility  Bed Mobility Overal bed mobility: Needs Assistance Bed Mobility: Supine to Sit   Sidelying to sit: Supervision, HOB elevated       General bed mobility comments: No assist needed to get to EOB or return to supine.    Transfers Overall transfer level: Needs assistance Equipment used: Rolling walker (2 wheels), None Transfers: Sit to/from Stand Sit to Stand: Min guard, Supervision           General transfer comment: Min guard initially progressing to supervision to stand from EOB x2 and from toilet x1.    Ambulation/Gait Ambulation/Gait assistance: Min guard Gait Distance (Feet): 100 Feet Assistive device: Rolling walker (2 wheels), None Gait Pattern/deviations: Step-through pattern, Decreased stride length, Decreased weight shift to right, Trunk flexed Gait velocity: decreased Gait velocity interpretation: <1.31 ft/sec, indicative of household ambulator   General Gait Details: Slow, mildly unsteady gait with shortened step length on right, cues for RW proximity and upright posture. Swelling in distal RLE and into foot. HR up to 128 bpm. Mild 2/4 DOE noted.   Stairs             Wheelchair Mobility    Modified Rankin (Stroke Patients Only)       Balance Overall balance assessment: Needs assistance Sitting-balance support: Feet supported, No upper extremity supported Sitting balance-Leahy Scale: Good     Standing balance support: During functional activity Standing balance-Leahy Scale: Fair Standing balance comment: Able to stand statically without UE support and walk short distances without DME, prefers UE support for longet hallway distances.                            Cognition  Arousal/Alertness: Awake/alert Behavior During Therapy: Flat affect Overall Cognitive Status: Impaired/Different from baseline Area of Impairment: Awareness                            Awareness: Emergent            Exercises      General Comments General comments (skin integrity, edema, etc.): HR up to 128 bpm with activity.      Pertinent Vitals/Pain Pain Assessment Pain Assessment: Faces Faces Pain Scale: No hurt    Home Living                          Prior Function            PT Goals (current goals can now be found in the care plan section) Progress towards PT goals: Progressing toward goals    Frequency    Min 3X/week      PT Plan Current plan remains appropriate    Co-evaluation              AM-PAC PT "6 Clicks" Mobility   Outcome Measure  Help needed turning from your back to your side while in a flat bed without using bedrails?: A Little Help needed moving from lying on your back to sitting on the side of a flat bed without using bedrails?: A Little Help needed moving to and from a bed to a chair (including a wheelchair)?: A Little Help needed standing up from a chair using your arms (e.g., wheelchair or bedside chair)?: A Little Help needed to walk in hospital room?: A Little Help needed climbing 3-5 steps with a railing? : A Lot 6 Click Score: 17    End of Session Equipment Utilized During Treatment: Gait belt Activity Tolerance: Patient tolerated treatment well Patient left: in bed;with call bell/phone within reach Nurse Communication: Mobility status PT Visit Diagnosis: Other abnormalities of gait and mobility (R26.89);Muscle weakness (generalized) (M62.81);Unsteadiness on feet (R26.81);Difficulty in walking, not elsewhere classified (R26.2)     Time: 4270-6237 PT Time Calculation (min) (ACUTE ONLY): 34 min  Charges:  $Gait Training: 8-22 mins $Therapeutic Activity: 8-22 mins                     Marisa Severin, PT, DPT Acute Rehabilitation Services Secure chat preferred Office [place] 07/22/2022, 10:31 AM

## 2022-07-22 NOTE — Progress Notes (Signed)
Inpatient Rehab Admissions Coordinator:    I met with Pt. To discuss potential CIR admit. He is  interested, states he has family in [place] who could help him. Currently, pt. Not medically ready for CIR, as work up for malignancy is ongoing. Will await biopsy results and treatment plan.  Clemens Catholic, [place], [place] Admissions Coordinator  [phone] ([place]) [phone] (office)

## 2022-07-22 NOTE — Progress Notes (Signed)
Nutrition Follow-up  DOCUMENTATION CODES:   Non-severe (moderate) malnutrition in context of chronic illness  INTERVENTION:  - Advance diet as tolerated.   NUTRITION DIAGNOSIS:   Moderate Malnutrition (in the context of chronic illness) related to poor appetite as evidenced by mild fat depletion, mild muscle depletion.  GOAL:   Patient will meet greater than or equal to 90% of their needs  MONITOR:   Vent status, Labs, TF tolerance, I & O's  REASON FOR ASSESSMENT:   Consult Enteral/tube feeding initiation and management  ASSESSMENT:   Pt with hx of leg mass (likely malignant but pt did not follow-up after dx), DVT, and alcohol abuse presented to ED with several days of SOB and cough. Febrile and hypoxic in ED. Imaging showed cirrhosis, pneumonia and innumerable pulmonary masses consistent with widespread metastatic disease. Pt also found to be Flu+.  Meds reviewed: pepcid, folic acid, MVI, sliding scale insulin, MVI. Labs reviewed: Na low, Mg Low.   The pt is currently NPO. The pt has been extubated and diet advanced since last assessment. The pt reports that he did really well with his meals yesterday. He states that he ate most of his meals yesterday. RD will continue to monitor for diet advancement.   Diet Order:   Diet Order             Diet NPO time specified Except for: Sips with Meds  Diet effective midnight                   EDUCATION NEEDS:   Not appropriate for education at this time  Skin:  Skin Assessment: Reviewed RN Assessment  Last BM:  07/21/22  Height:   Ht Readings from Last 1 Encounters:  07/17/22 '6\' 3"'$  (1.905 m)    Weight:   Wt Readings from Last 1 Encounters:  07/22/22 91.3 kg    Ideal Body Weight:  89.1 kg  BMI:  Body mass index is 25.16 kg/m.  Estimated Nutritional Needs:   Kcal:  2500-2700 kcal/d  Protein:  125-140g/d  Fluid:  2.5-2.7L/d  Thalia Bloodgood RD, LDN, CNSC.

## 2022-07-22 NOTE — Plan of Care (Signed)
  Problem: Nutritional: Goal: Maintenance of adequate nutrition will improve Outcome: Progressing   Problem: Coping: Goal: Ability to adjust to condition or change in health will improve Outcome: Not Progressing   Problem: Health Behavior/Discharge Planning: Goal: Ability to manage health-related needs will improve Outcome: Not Progressing

## 2022-07-22 NOTE — Procedures (Signed)
Interventional Radiology Procedure:   Indications: Metastatic disease and needs tissue diagnosis  Procedure: US guided core biopsy of right calf mass  Findings: Soft tissue masses in right calf.  Core biopsies obtained.    Complications: No immediate complications noted.     EBL: Minimal  Plan: Return to inpatient floor   Marchel Foote R. Anselm Pancoast, MD  Pager: [phone]

## 2022-07-22 NOTE — Consult Note (Signed)
Chief Complaint: Patient was seen in consultation today for leg mass at the request of Dr. Ina Homes  Referring Physician(s): Ina Homes, MD  Supervising Physician: Markus Daft  Patient Status: [facility] - In-pt  History of Present Illness: [name] is a 42 y.o. male with known right leg mass and DVT, lost to follow up.  He was admitted 07/17/22 with pneumonia and flu and developed altered mental status.  CT imaging demonstrated multiple large lung masses and chronic thromboembolism.  He has was previously intubated during hospitalization, but is now extubated and not requiring supplemental O2.  His mentation is back to baseline.  He is undergoing workup for the numerous masses concerning for metastatic disease.  IR asked for biopsy.  Patient reports feeling well today and expresses readiness to proceed with workup.  He reports no pain in his body, no SOB, dizziness, headache, N/V/D, changes in urinary or bowel habits.  He states he does feel a little stiff when he first gets out of bed and moving but that this improves with movement.  Past Medical History:  Diagnosis Date   Epistaxis    Tooth infection     Past Surgical History:  Procedure Laterality Date   arm surgery      Allergies: Patient has no known allergies.  Medications: Prior to Admission medications   Medication Sig Start Date End Date Taking? Authorizing Provider  acetaminophen (TYLENOL) 500 MG tablet Take 1,000 mg by mouth every 6 (six) hours as needed for moderate pain.   Yes [provider]  ibuprofen (ADVIL) 200 MG tablet Take 200 mg by mouth every 6 (six) hours as needed for mild pain.   Yes [provider]  APIXABAN Arne [place]) VTE STARTER PACK ('10MG'$  AND '5MG'$ ) Take as directed on package: start with two-'5mg'$  tablets twice daily for 7 days. On day 8, switch to one-'5mg'$  tablet twice daily. Patient not taking: Reported on 07/18/2022 02/03/22   Sherwood Gambler, MD     Family History  Problem  Relation Age of Onset   Heart failure Neg Hx    Heart attack Neg Hx    Fainting Neg Hx     Social History   Socioeconomic History   Marital status: Single    Spouse name: Not on file   Number of children: Not on file   Years of education: Not on file   Highest education level: Not on file  Occupational History   Not on file  Tobacco Use   Smoking status: Never   Smokeless tobacco: Never  Substance and Sexual Activity   Alcohol use: Yes    Comment: 5th liquor daily   Drug use: No   Sexual activity: Not on file  Other Topics Concern   Not on file  Social History Narrative   Not on file   Social Determinants of Health   Financial Resource Strain: Not on file  Food Insecurity: Not on file  Transportation Needs: Not on file  Physical Activity: Not on file  Stress: Not on file  Social Connections: Not on file    Review of Systems: A 12 point ROS discussed and pertinent positives are indicated in the HPI above.  All other systems are negative.  Vital Signs: BP 134/83 (BP Location: Left Arm)   Pulse 100   Temp 99.6 F (37.6 C) (Oral)   Resp 19   Ht '6\' 3"'$  (1.905 m)   Wt 201 lb 4.8 oz (91.3 kg)   SpO2 96%   BMI  25.16 kg/m   Physical Exam Constitutional:      General: He is not in acute distress.    Appearance: He is well-developed.  HENT:     Head: Normocephalic and atraumatic.     Mouth/Throat:     Mouth: Mucous membranes are moist.     Pharynx: Oropharynx is clear.  Cardiovascular:     Comments: 3+ R pedal edema limiting palpation of distal pulses Pulses in other extremities are strong and equal Pulmonary:     Effort: Pulmonary effort is normal.     Breath sounds: Normal breath sounds.  Abdominal:     Palpations: Abdomen is soft.     Tenderness: There is no abdominal tenderness.  Musculoskeletal:     Right lower leg: Edema present.     Comments: Extensive enlargement of right calf, warm to the touch, consistent with known area of mass and DVT.  Region  of mass is soft and reportedly non-tender.  A single small patch of redness of the distal medial skin.   LLE: WNL  Skin:    General: Skin is warm and dry.  Neurological:     General: No focal deficit present.     Mental Status: He is alert and oriented to person, place, and time.  Psychiatric:        Mood and Affect: Mood normal.        Behavior: Behavior normal.     Imaging: CT ABDOMEN PELVIS WO CONTRAST  Result Date: 07/18/2022 CLINICAL DATA:  Innumerable bilateral pulmonary masses compatible with widespread pulmonary metastatic disease on the chest CTA obtained yesterday. The current study is to evaluate for a possible primary source and additional metastatic disease. EXAM: CT ABDOMEN AND PELVIS WITHOUT CONTRAST TECHNIQUE: Multidetector CT imaging of the abdomen and pelvis was performed following the standard protocol without IV contrast. RADIATION DOSE REDUCTION: This exam was performed according to the departmental dose-optimization program which includes automated exposure control, adjustment of the mA and/or kV according to patient size and/or use of iterative reconstruction technique. COMPARISON:  None Available. FINDINGS: Lower chest: A large number of bilateral pulmonary masses and nodules are again demonstrated. Interval dense left lower lobe consolidation. Slightly more confluent tree in bud opacities and patchy opacities associated with cylindrical bronchiectasis at the right lung base. Streak artifacts from the patient's arms with no definite pleural fluid seen. Hepatobiliary: Previously noted changes of cirrhosis of the liver. Pancreas: Unremarkable. No pancreatic ductal dilatation or surrounding inflammatory changes. Spleen: Normal in size without focal abnormality. Adrenals/Urinary Tract: Foley catheter in the urinary bladder with associated air in the bladder. Mild-to-moderate diffuse bladder wall thickening. Unremarkable adrenal glands, kidneys and ureters. Stomach/Bowel:  Nasogastric tube in the stomach. There is some oral contrast in the stomach as well as well as in normal caliber small bowel loops. Minimal sigmoid colon diverticulosis. Otherwise, unremarkable colon and normal-appearing appendix. Vascular/Lymphatic: No significant vascular findings are present. No enlarged abdominal or pelvic lymph nodes. Reproductive: Unremarkable prostate gland. Other: Small to moderate-sized umbilical hernia containing fat. Musculoskeletal: Minimal lumbar spine degenerative changes. Bilateral L5 pars interarticularis defects with associated minimal grade 1 anterolisthesis at the L5-S1 level. No evidence of bony metastatic disease. IMPRESSION: 1. Interval dense left lower lobe consolidation, compatible with dense atelectasis or pneumonia. 2. Slightly more confluent tree in bud opacities and patchy opacities associated with cylindrical bronchiectasis at the right lung base compatible with a mildly progressive infectious/inflammatory process. 3. Extensive bilateral pulmonary metastases. 4. No evidence of primary malignancy or metastatic disease  in the abdomen or pelvis. 5. Stable changes of cirrhosis of the liver. 6. Small to moderate-sized umbilical hernia containing fat. 7. 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 Electronically Signed   By: Claudie Revering M.D.   On: 07/18/2022 23:24   ECHOCARDIOGRAM COMPLETE  Result Date: 07/18/2022    ECHOCARDIOGRAM REPORT   Patient Name:   Jemario Poitras Date of Exam: 07/18/2022 Medical Rec #:  [id number]     Height:       75.0 in Accession #:    [id number]    Weight:       201.1 lb Date of Birth:  1981-03-09     BSA:          2.199 m Patient Age:    48 years      BP:           117/102 mmHg Patient Gender: M             HR:           77 bpm. Exam Location:  Inpatient Procedure: 2D Echo, Cardiac Doppler and Color Doppler                         STAT ECHO Reported to: Dr Skeet Latch on 07/18/2022  8:02:00 AM. Indications:    Abnormal ECG  History:        Patient has no prior history of Echocardiogram examinations.                 Signs/Symptoms:Shortness of Breath. Hx DVT. ETOH abuse.  Sonographer:    Clayton Lefort RDCS (AE) Referring Phys: Cassie Freer O'NEAL  Sonographer Comments: Echo performed with patient supine and on artificial respirator. Dr. Gwynneth Macleod O'Neal at bedside during echo. IMPRESSIONS  1. Left ventricular ejection fraction, by estimation, is 50 to 55%. The left ventricle has low normal function. The left ventricle has no regional wall motion abnormalities. Left ventricular diastolic parameters were normal.  2. Right ventricular systolic function is low normal. The right ventricular size is normal. There is normal pulmonary artery systolic pressure.  3. The mitral valve is normal in structure. No evidence of mitral valve regurgitation. No evidence of mitral stenosis.  4. The aortic valve is tricuspid. Aortic valve regurgitation is not visualized. No aortic stenosis is present.  5. The inferior vena cava is normal in size with greater than 50% respiratory variability, suggesting right atrial pressure of 3 mmHg. FINDINGS  Left Ventricle: Left ventricular ejection fraction, by estimation, is 50 to 55%. The left ventricle has low normal function. The left ventricle has no regional wall motion abnormalities. The left ventricular internal cavity size was normal in size. There is no left ventricular hypertrophy. Left ventricular diastolic parameters were normal. Right Ventricle: The right ventricular size is normal. No increase in right ventricular wall thickness. Right ventricular systolic function is low normal. There is normal pulmonary artery systolic pressure. The tricuspid regurgitant velocity is 1.14 m/s,  and with an assumed right atrial pressure of 15 mmHg, the estimated right ventricular systolic pressure is 87.5 mmHg. Left Atrium: Left atrial size was normal in size. Right Atrium: Right atrial  size was normal in size. Pericardium: There is no evidence of pericardial effusion. Mitral Valve: The mitral valve is normal in structure. No evidence of mitral valve regurgitation. No evidence of mitral valve stenosis. Tricuspid Valve: The tricuspid valve is normal in structure. Tricuspid valve regurgitation is trivial. No  evidence of tricuspid stenosis. Aortic Valve: The aortic valve is tricuspid. Aortic valve regurgitation is not visualized. No aortic stenosis is present. Aortic valve mean gradient measures 3.0 mmHg. Aortic valve peak gradient measures 5.2 mmHg. Aortic valve area, by VTI measures 3.39 cm. Pulmonic Valve: The pulmonic valve was normal in structure. Pulmonic valve regurgitation is not visualized. No evidence of pulmonic stenosis. Aorta: The aortic root is normal in size and structure. Venous: IVC assessment for right atrial pressure unable to be performed due to mechanical ventilation. The inferior vena cava is normal in size with greater than 50% respiratory variability, suggesting right atrial pressure of 3 mmHg. IAS/Shunts: No atrial level shunt detected by color flow Doppler.  LEFT VENTRICLE PLAX 2D LVIDd:         5.80 cm      Diastology LVIDs:         4.40 cm      LV e' medial:    10.70 cm/s LV PW:         0.90 cm      LV E/e' medial:  7.2 LV IVS:        0.80 cm      LV e' lateral:   16.50 cm/s LVOT diam:     2.30 cm      LV E/e' lateral: 4.7 LV SV:         73 LV SV Index:   33 LVOT Area:     4.15 cm  LV Volumes (MOD) LV vol d, MOD A2C: 104.0 ml LV vol d, MOD A4C: 119.0 ml LV vol s, MOD A2C: 57.4 ml LV vol s, MOD A4C: 56.3 ml LV SV MOD A2C:     46.6 ml LV SV MOD A4C:     119.0 ml LV SV MOD BP:      52.7 ml RIGHT VENTRICLE             IVC RV Basal diam:  2.50 cm     IVC diam: 2.80 cm RV S prime:     12.30 cm/s TAPSE (M-mode): 1.9 cm LEFT ATRIUM             Index        RIGHT ATRIUM           Index LA diam:        3.40 cm 1.55 cm/m   RA Area:     12.30 cm LA Vol (A2C):   28.9 ml 13.14 ml/m   RA Volume:   28.00 ml  12.73 ml/m LA Vol (A4C):   32.9 ml 14.96 ml/m LA Biplane Vol: 30.8 ml 14.00 ml/m  AORTIC VALVE AV Area (Vmax):    3.64 cm AV Area (Vmean):   3.41 cm AV Area (VTI):     3.39 cm AV Vmax:           114.00 cm/s AV Vmean:          75.400 cm/s AV VTI:            0.216 m AV Peak Grad:      5.2 mmHg AV Mean Grad:      3.0 mmHg LVOT Vmax:         99.80 cm/s LVOT Vmean:        61.900 cm/s LVOT VTI:          0.176 m LVOT/AV VTI ratio: 0.81  AORTA Ao Root diam: 3.40 cm Ao Asc diam:  3.40 cm MITRAL VALVE  TRICUSPID VALVE MV Area (PHT): 2.63 cm    TR Peak grad:   5.2 mmHg MV Decel Time: 288 msec    TR Vmax:        114.00 cm/s MV E velocity: 77.10 cm/s MV A velocity: 38.90 cm/s  SHUNTS MV E/A ratio:  1.98        Systemic VTI:  0.18 m                            Systemic Diam: 2.30 cm Skeet Latch MD Electronically signed by Skeet Latch MD Signature Date/Time: 07/18/2022/8:16:15 AM    Final    DG Chest Portable 1 View  Result Date: 07/17/2022 CLINICAL DATA:  Intubation. Multiple pulmonary masses/nodules on recent chest CT suspicious for metastatic disease. EXAM: PORTABLE CHEST 1 VIEW COMPARISON:  Radiographs 07/17/2022 and 07/16/2022. CT 07/17/2022. Right lower leg MRI 02/03/2022. FINDINGS: 1528 hours. Interval intubation. Tip of the endotracheal tube is in the mid trachea. Nasogastric tube projects below the diaphragm, tip not visualized. The heart size and mediastinal contours are grossly stable. As seen on recent studies, there are multiple pulmonary nodules bilaterally with a large mass peripherally in the left upper lobe. There are patchy airspace opacities throughout the right lung which appears slightly worse. The right costophrenic angle is incompletely visualized, but no large pleural effusion or pneumothorax is identified. The bones appear unremarkable. IMPRESSION: 1. Satisfactory position of the endotracheal and nasogastric tubes. 2. Bilateral pulmonary nodules and  left upper lobe mass most consistent with metastatic disease. 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 3. Slight worsening of patchy airspace opacities throughout the right lung suspicious for superimposed pneumonia or hemorrhage. Electronically Signed   By: Richardean Sale M.D.   On: 07/17/2022 15:44   DG Chest Portable 1 View  Result Date: 07/17/2022 CLINICAL DATA:  Shortness of breath. EXAM: PORTABLE CHEST 1 VIEW COMPARISON:  Chest radiograph 1 day prior, same day CTA chest FINDINGS: The cardiomediastinal silhouette is stable. Multiple pulmonary masses are again seen, the largest measuring up to 9.8 cm in the left lung. Right middle lobe consolidation is also unchanged. There is no new or worsening focal airspace disease compared to the radiograph from 1 day prior. There is no pleural effusion or pneumothorax There is no acute osseous abnormality. IMPRESSION: Multiple pulmonary masses and right middle lobe consolidation are unchanged. No significant interval change since the radiograph from 1 day prior or same day CTA chest. Electronically Signed   By: Valetta Mole M.D.   On: 07/17/2022 13:09   CT Angio Chest PE W and/or Wo Contrast  Result Date: 07/17/2022 CLINICAL DATA:  Pulmonary embolism. EXAM: CT ANGIOGRAPHY CHEST WITH CONTRAST TECHNIQUE: Multidetector CT imaging of the chest was performed using the standard protocol during bolus administration of intravenous contrast. Multiplanar CT image reconstructions and MIPs were obtained to evaluate the vascular anatomy. RADIATION DOSE REDUCTION: This exam was performed according to the departmental dose-optimization program which includes automated exposure control, adjustment of the mA and/or kV according to patient size and/or use of iterative reconstruction technique. CONTRAST:  31m OMNIPAQUE IOHEXOL 350 MG/ML SOLN COMPARISON:  None Available. FINDINGS: Cardiovascular: There is  adequate opacification of the pulmonary arterial tree. There are multiple synechia and eccentric filling defects within the right middle and lower lobar segmental pulmonary arteries in keeping with chronic pulmonary embolism. No intraluminal filling defect identified to suggest  acute pulmonary emboli. The central pulmonary arteries are mildly enlarged in keeping with changes of pulmonary arterial hypertension. Global cardiac size within normal limits. No pericardial effusion. The thoracic aorta is unremarkable. There is an intraluminal filling defect within the right superior pulmonary vein which may represent intraluminal thrombus or intravascular extension of tumor. Mediastinum/Nodes: Visualized thyroid is unremarkable. No pathologic thoracic adenopathy. The esophagus is unremarkable. Lungs/Pleura: There are innumerable pulmonary masses seen bilaterally most in keeping with widespread pulmonary metastatic disease. Dominant mass within the left upper lobe measures 7.9 cm in greatest dimension. There is superimposed tree-in-bud nodularity throughout the right lung, consolidation within the right middle lobe, and bronchial wall thickening and bronchiolectasis best appreciated within the right lower lobe. These findings can be seen the setting of superimposed acute multilobar infection or, less likely, endobronchial extension of disease. No pneumothorax or pleural effusion. Layering debris is seen within the distal trachea. Upper Abdomen: The liver contour is nodular in keeping with underlying cirrhosis. No acute abnormality. Musculoskeletal: No acute bone abnormality. No lytic or blastic bone lesion. Review of the MIP images confirms the above findings. IMPRESSION: 1. 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 No acute pulmonary emboli identified. 2. Innumerable pulmonary masses bilaterally most in keeping with widespread pulmonary metastatic disease. 3.  Superimposed tree-in-bud nodularity throughout the right lung, consolidation within the right middle lobe, and bronchial wall thickening and bronchiolectasis best appreciated within the right lower lobe. These findings can be seen the setting of superimposed acute multilobar infection or, less likely, endobronchial extension of disease. 4. Intraluminal filling defect within the right superior pulmonary vein which may represent intraluminal thrombus or intravascular extension of tumor. 5. Morphologic changes in keeping with underlying pulmonary arterial hypertension. 6. Cirrhosis. Electronically Signed   By: Fidela Salisbury M.D.   On: 07/17/2022 01:16   DG Chest 2 View  Result Date: 07/16/2022 CLINICAL DATA:  Shortness of breath EXAM: CHEST - 2 VIEW COMPARISON:  04/21/2014 FINDINGS: Stable cardiomediastinal silhouette. Numerous bilateral nodular opacities measuring up to 2.8 cm on the right. Large subpleural mass measuring approximately 9.4 cm in the left midthorax. Hazy airspace opacity in the right lower lung. No pleural effusion or pneumothorax. No acute osseous abnormality. IMPRESSION: Large left mid lung subpleural mass. Numerous bilateral pulmonary nodules. Findings are concerning for metastases. Hazy opacity in the right lower lobe is nonspecific and may be neoplastic or infectious. These findings would be better evaluated with CT chest with IV contrast. Electronically Signed   By: Placido Sou M.D.   On: 07/16/2022 23:27    Labs:  CBC: Recent Labs    07/19/22 0641 07/20/22 0057 07/21/22 0108 07/22/22 0130  WBC 51.9* 21.7* 19.5* 20.9*  HGB 10.2* 9.4* 9.4* 10.5*  HCT 30.8* 29.2* 29.6* 31.4*  PLT 580* 425* 394 389   BMP: Recent Labs    07/17/22 0900 07/17/22 1604 07/17/22 1610 07/17/22 1805 07/18/22 0647 07/21/22 0108 07/22/22 0130  NA 129*   < >  --  134* 134* 136 134*  K 3.3*   < >  --  3.9 3.7 2.9* 4.0  CL 97*  --   --   --  102 103 103  CO2 24  --   --   --  '25 24 23   '$ GLUCOSE 118*  --   --   --  147* 110* 101*  BUN 10  --   --   --  '15 10 7  '$ CALCIUM 9.7  --   --   --  10.2 9.4 9.5  CREATININE 0.71  --  0.61  --  0.66 0.74 0.68  GFRNONAA >60  --  >60  --  >60 >60 >60   < > = values in this interval not displayed.    LIVER FUNCTION TESTS: Recent Labs    02/03/22 0939 07/17/22 0530 07/17/22 0900  BILITOT 2.0* 2.1*  --   AST 33 38  --   ALT 16 15  --   ALKPHOS 125 79  --   PROT 8.1 6.9  --   ALBUMIN 3.2* 2.4* 2.3*   Assessment and Plan:  This is a pleasant 42 year old male with 7 month progressive growth of his right calf.  A mass was identified in 01/2022, but he was lost to follow up and this was not further explored.  During his current hospitalization for complications associated with flu/pneumonia, additional masses were identified in his chest and further in-house work up desired.  Dr Anselm Pancoast reviewed the case and approves a US guided right leg mass biopsy.  Vitals, history, labs, and medication were reviewed.  Patient is appropriately NPO. Lovenox appropriately held.  Will plan to proceed with biopsy today.  Risks and benefits of leg mass biopsy was discussed with the patient and/or patient's family including, but not limited to bleeding, infection, damage to adjacent structures or low yield requiring additional tests.  All of the questions were answered and there is agreement to proceed.  Consent signed and in IR control room box.   Thank you for this interesting consult.  I greatly enjoyed meeting Simcha Speir and look forward to participating in their care.  A copy of this report was sent to the requesting provider on this date.  Electronically Signed: Pasty Spillers, PA 07/22/2022, 10:12 AM   I spent a total of 40 Minutes in face to face in clinical consultation, greater than 50% of which was counseling/coordinating care for right leg mass

## 2022-07-23 DIAGNOSIS — J189 Pneumonia, unspecified organism: Secondary | ICD-10-CM | POA: Diagnosis not present

## 2022-07-23 LAB — CBC
HCT: 29.7 % — ABNORMAL LOW (ref 39.0–52.0)
Hemoglobin: 10 g/dL — ABNORMAL LOW (ref 13.0–17.0)
MCH: 36.9 pg — ABNORMAL HIGH (ref 26.0–34.0)
MCHC: 33.7 g/dL (ref 30.0–36.0)
MCV: 109.6 fL — ABNORMAL HIGH (ref 80.0–100.0)
Platelets: 353 10*3/uL (ref 150–400)
RBC: 2.71 MIL/uL — ABNORMAL LOW (ref 4.22–5.81)
RDW: 13.9 % (ref 11.5–15.5)
WBC: 14.6 10*3/uL — ABNORMAL HIGH (ref 4.0–10.5)
nRBC: 0 % (ref 0.0–0.2)

## 2022-07-23 LAB — GLUCOSE, CAPILLARY
Glucose-Capillary: 119 mg/dL — ABNORMAL HIGH (ref 70–99)
Glucose-Capillary: 109 mg/dL — ABNORMAL HIGH (ref 70–99)
Glucose-Capillary: 161 mg/dL — ABNORMAL HIGH (ref 70–99)
Glucose-Capillary: 134 mg/dL — ABNORMAL HIGH (ref 70–99)

## 2022-07-23 LAB — BASIC METABOLIC PANEL
Anion gap: 8 (ref 5–15)
BUN: 8 mg/dL (ref 6–20)
CO2: 23 mmol/L (ref 22–32)
Calcium: 9.7 mg/dL (ref 8.9–10.3)
Chloride: 100 mmol/L (ref 98–111)
Creatinine, Ser: 0.81 mg/dL (ref 0.61–1.24)
GFR, Estimated: >60 mL/min (ref >60)
Glucose, Bld: 108 mg/dL — ABNORMAL HIGH (ref 70–99)
Potassium: 4 mmol/L (ref 3.5–5.1)
Sodium: 131 mmol/L — ABNORMAL LOW (ref 135–145)

## 2022-07-23 NOTE — PMR Pre-admission (Shared)
PMR Admission Coordinator Pre-Admission Assessment  Patient: [name] is an 42 y.o., male MRN: [id number] DOB: [birth year] Height: _0  (190.5 cm) Weight: 88.7 kg  Insurance Information HMO: ***    PPO: ***     PCP: ***     IPA: ***     80/20: ***     OTHER: *** PRIMARY: BCBS Commercial      Policy#: ***      Subscriber: *** CM Name: ***      Phone#: ***     Fax#: *** Pre-Cert#: ***      Employer: *** Benefits:  Phone #: ***     Name: *** Eff. Date: ***     Deduct: ***      Out of Pocket Max: ***      Life Max: *** CIR: ***      SNF: *** Outpatient: ***     Co-Pay: *** Home Health: ***      Co-Pay: *** DME: ***     Co-Pay: *** Providers: *** SECONDARY: ***      Policy#: ***     Phone#: ***  Financial Counselor: ***      Phone#: ***  The "Data Collection Information Summary" for patients in Inpatient Rehabilitation Facilities with attached "Privacy Act Zeb Records" was provided and verbally reviewed with: N/A  Emergency Contact Information Contact Information     Name Relation Home Work Mobile   [place] Brother [phone]     Zaim, Nitta   [phone]       Current Medical History  Patient Admitting Diagnosis: Respiratory failure, Cancer History of Present Illness:Pt. Is a  42 year old male with known right leg muscle mass and DVT presented to the emergency department on July 17, 2022 early in the morning complaining of shortness of breath and cough. Was found to have influenza and pneumonia. Has been treated with Tamiflu and broad-spectrum antibiotics for concern for bacterial superinfection. Has been receiving oxygen, breathing treatments and has had some wheezing. Developed progressive shortness of breath, mental status changes and became minimally responsive. Pulmonary and critical care medicine was consulted for worsening respiratory failure and change in mental status. 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 He was subsequently intubated and transferred to ICU. He was noted to have diffuse ST elevations in 12/29 consistent with acute pericarditis and has been seen by cardiology with initiation of high-dose stress steroids and is now on colchicine. Pt. Undewent ultrasound biopsy of the mass on his legs 07/22/21. *** Pt seen by PT/OT and they recommend CIR to assist return to PLOF.     Patient's medical record from [facility]  has been reviewed by the rehabilitation admission coordinator and physician.  Past Medical History  Past Medical History:  Diagnosis Date   Epistaxis    Tooth infection     Has the patient had major surgery during 100 days prior to admission? Yes  Family History   family history is not on file.  Current Medications  Current Facility-Administered Medications:    0.9 %  sodium chloride infusion, 250 mL, Intravenous, Continuous, Ogan, Okoronkwo U, MD, Last Rate: 10 mL/hr at 07/20/22 0555, 250 mL at 07/20/22 0555   acetaminophen (TYLENOL) suppository 325 mg, 325 mg, Rectal, Q4H PRN, Dana Allan I, MD, 325 mg at 07/17/22 3887   aspirin chewable tablet 81 mg, 81 mg, Oral, Daily, Heath Lark D, DO, 81 mg at 07/23/22 0819   budesonide (PULMICORT) nebulizer solution  0.25 mg, 0.25 mg, Nebulization, BID, Dana Allan I, MD, 0.25 mg at 07/23/22 8325   colchicine tablet 0.6 mg, 0.6 mg, Oral, BID, Manuella Ghazi, Pratik D, DO, 0.6 mg at 07/23/22 0818   enoxaparin (LOVENOX) injection 90 mg, 90 mg, Subcutaneous, Q12H, Monia Sabal, PA-C, 90 mg at 07/23/22 0820   famotidine (PEPCID) tablet 20 mg, 20 mg, Oral, BID, Manuella Ghazi, Pratik D, DO, 20 mg at 49/82/64 1583   folic acid (FOLVITE) tablet 1 mg, 1 mg, Oral, Daily, Manuella Ghazi, Pratik D, DO, 1 mg at 07/23/22 0818   insulin aspart (novoLOG) injection 0-9 Units, 0-9 Units, Subcutaneous, TID AC & HS, Ogan, Okoronkwo U, MD   ipratropium-albuterol (DUONEB) 0.5-2.5 (3) MG/3ML nebulizer solution  3 mL, 3 mL, Nebulization, Q4H PRN, Vann, Jessica U, DO   multivitamin with minerals tablet 1 tablet, 1 tablet, Oral, Daily, Manuella Ghazi, Pratik D, DO, 1 tablet at 07/23/22 0818   Oral care mouth rinse, 15 mL, Mouth Rinse, PRN, Simonne Maffucci B, MD   polyethylene glycol (MIRALAX / GLYCOLAX) packet 17 g, 17 g, Per Tube, Daily PRN, Wilson Singer I, RPH   thiamine (VITAMIN B1) tablet 100 mg, 100 mg, Oral, Daily, 100 mg at 07/23/22 0818 **OR** thiamine (VITAMIN B1) injection 100 mg, 100 mg, Intravenous, Daily, Manuella Ghazi, Pratik D, DO  Patients Current Diet:  Diet Order             Diet regular Room service appropriate? Yes; Fluid consistency: Thin  Diet effective now                   Precautions / Restrictions Precautions Precautions: Fall, Other (comment) Precaution Comments: swelling in RLE Restrictions Weight Bearing Restrictions: Yes   Has the patient had 2 or more falls or a fall with injury in the past year? No  Prior Activity Level Community (5-7x/wk): Pt. working and active in the community PTA  Prior Functional Level Self Care: Did the patient need help bathing, dressing, using the toilet or eating? Independent  Indoor Mobility: Did the patient need assistance with walking from room to room (with or without device)? Independent  Stairs: Did the patient need assistance with internal or external stairs (with or without device)? Independent  Functional Cognition: Did the patient need help planning regular tasks such as shopping or remembering to take medications? Independent  Patient Information Are you of Hispanic, Latino/a,or Spanish origin?: A. No, not of Hispanic, Latino/a, or Spanish origin What is your race?: B. Black or African American Do you need or want an interpreter to communicate with a doctor or health care staff?: 0. No  Patient's Response To:  Health Literacy and Transportation Is the patient able to respond to health literacy and transportation needs?:  Yes Health Literacy - How often do you need to have someone help you when you read instructions, pamphlets, or other written material from your doctor or pharmacy?: Never In the past 12 months, has lack of transportation kept you from medical appointments or from getting medications?: No In the past 12 months, has lack of transportation kept you from meetings, work, or from getting things needed for daily living?: No  Home Assistive Devices / Equipment Home Equipment: None  Prior Device Use: Indicate devices/aids used by the patient prior to current illness, exacerbation or injury? None of the above  Current Functional Level Cognition  Overall Cognitive Status: Impaired/Different from baseline Current Attention Level: Sustained Orientation Level: Oriented X4    Extremity Assessment (includes Sensation/Coordination)  Upper Extremity Assessment: Generalized  weakness, Overall WFL for tasks assessed  Lower Extremity Assessment: Generalized weakness, Overall WFL for tasks assessed    ADLs       Mobility  Overal bed mobility: Needs Assistance Bed Mobility: Supine to Sit Rolling: Mod assist Sidelying to sit: Supervision, HOB elevated General bed mobility comments: No assist needed to get to EOB or return to supine.    Transfers  Overall transfer level: Needs assistance Equipment used: Rolling walker (2 wheels), None Transfers: Sit to/from Stand Sit to Stand: Min guard, Supervision General transfer comment: Min guard initially progressing to supervision to stand from EOB x2 and from toilet x1.    Ambulation / Gait / Stairs / Wheelchair Mobility  Ambulation/Gait Ambulation/Gait assistance: Counsellor (Feet): 100 Feet Assistive device: Rolling walker (2 wheels), None Gait Pattern/deviations: Step-through pattern, Decreased stride length, Decreased weight shift to right, Trunk flexed General Gait Details: Slow, mildly unsteady gait with shortened step length on right,  cues for RW proximity and upright posture. Swelling in distal RLE and into foot. HR up to 128 bpm. Mild 2/4 DOE noted. Gait velocity: decreased Gait velocity interpretation: <1.31 ft/sec, indicative of household ambulator    Posture / Balance Dynamic Sitting Balance Sitting balance - Comments: reliant on UE's or external support.  Quick to fatigue Balance Overall balance assessment: Needs assistance Sitting-balance support: Feet supported, No upper extremity supported Sitting balance-Leahy Scale: Good Sitting balance - Comments: reliant on UE's or external support.  Quick to fatigue Standing balance support: During functional activity Standing balance-Leahy Scale: Fair Standing balance comment: Able to stand statically without UE support and walk short distances without DME, prefers UE support for longet hallway distances.    Special needs/care consideration Special service needs ***   Previous Home Environment (from acute therapy documentation) Living Arrangements: Non-relatives/Friends (pt and roommate)  Lives With: Other (Comment) Available Help at Discharge: Family, Friend(s) (roommate, 2 dtrs and their moms) Type of Home: Apartment Home Layout: One level Home Access: Level entry Bathroom Shower/Tub: Chiropodist: Standard Bathroom Accessibility: Yes How Accessible: Accessible via walker  Discharge Living Setting Type of Home at Discharge: [place] Patient Roles: Other (Comment) Contact Information: [phone] Anticipated Caregiver: Sister [place] (gegia) Ability/Limitations of Caregiver: Min A (she works, other family to assist days) Caregiver Availability: 24/7 Discharge Plan Discussed with Primary Caregiver: Yes Is Caregiver In Agreement with Plan?: Yes Does Caregiver/Family have Issues with Lodging/Transportation while Pt is in Rehab?: No  Goals Patient/Family Goal for Rehab: PT/OT Supervision Expected length of  stay: 12-14 days Pt/Family Agrees to Admission and willing to participate: Yes Program Orientation Provided & Reviewed with Pt/Caregiver Including Roles  & Responsibilities: Yes  Decrease burden of Care through IP rehab admission: not anticipated  Possible need for SNF placement upon discharge: not anticipated   Patient Condition: I have reviewed medical records from [facility] , spoken with CM, and patient and family member. I met with patient at the bedside and discussed via phone for inpatient rehabilitation assessment.  Patient will benefit from ongoing PT and OT, can actively participate in 3 hours of therapy a day 5 days of the week, and can make measurable gains during the admission.  Patient will also benefit from the coordinated team approach during an Inpatient Acute Rehabilitation admission.  The patient will receive intensive therapy as well as Rehabilitation physician, nursing, social worker, and care management interventions.  Due to safety, skin/wound care, disease management, medication administration, pain management, and patient  education the patient requires 24 hour a day rehabilitation nursing.  The patient is currently *** with mobility and basic ADLs.  Discharge setting and therapy post discharge at home with home health is anticipated.  Patient has agreed to participate in the Acute Inpatient Rehabilitation Program and will admit {Time; today/tomorrow:10263}.  Preadmission Screen Completed By:  Genella Mech, 07/23/2022 1:37 PM ______________________________________________________________________   Discussed status with Dr. Marland Kitchen on *** at *** and received approval for admission today.  Admission Coordinator:  Genella Mech, CCC-SLP, time Marland KitchenSudie Grumbling ***   Assessment/Plan: Diagnosis: Does the need for close, 24 hr/day Medical supervision in concert with the patient's rehab needs make it unreasonable for this patient to be served in a less intensive setting?  {yes_no_potentially:3041433} Co-Morbidities requiring supervision/potential complications: *** Due to {due QB:1694503}, does the patient require 24 hr/day rehab nursing? {yes_no_potentially:3041433} Does the patient require coordinated care of a physician, rehab nurse, PT, OT, and SLP to address physical and functional deficits in the context of the above medical diagnosis(es)? {yes_no_potentially:3041433} Addressing deficits in the following areas: {deficits:3041436} Can the patient actively participate in an intensive therapy program of at least 3 hrs of therapy 5 days a week? {yes_no_potentially:3041433} The potential for patient to make measurable gains while on inpatient rehab is {potential:3041437} Anticipated functional outcomes upon discharge from inpatient rehab: {functional outcomes:304600100} PT, {functional outcomes:304600100} OT, {functional outcomes:304600100} SLP Estimated rehab length of stay to reach the above functional goals is: *** Anticipated discharge destination: {anticipated dc setting:21604} 10. Overall Rehab/Functional Prognosis: {potential:3041437}   MD Signature: ***

## 2022-07-23 NOTE — Progress Notes (Signed)
Inpatient Rehab Admissions Coordinator:    I met with Pt. For further discussion regarding potential CIR admit. Pt. Is interested and states that he talked to his sister about going to stay with her in [place]. Currently, Pt. Is not ready, as  I await pathology report and oncologic treatment plan to see how CIR fits into that.   Clemens Catholic, [place], [place] Admissions Coordinator  [phone] ([place]) [phone] (office)

## 2022-07-23 NOTE — Progress Notes (Signed)
PROGRESS NOTE    [name]  RSW:[id number] DOB: [birth year] DOA: 07/16/2022 PCP: Patient, No Pcp Per   Brief Narrative:    42 year old male with known right leg muscle mass and DVT presented to the emergency department on July 17, 2022 early in the morning complaining of shortness of breath and cough. Was found to have influenza and pneumonia. Has been treated with Tamiflu and broad-spectrum antibiotics for concern for bacterial superinfection. Has been receiving oxygen, breathing treatments and has had some wheezing. Developed progressive shortness of breath, mental status changes and became minimally responsive. Pulmonary and critical care medicine was consulted for worsening respiratory failure and change in mental status.  CT angiogram on 12/28 demonstrated multiple large masses bilaterally with chronic thromboembolism.  He was subsequently intubated and transferred to ICU.  He was noted to have diffuse ST elevations in 12/29 consistent with acute pericarditis and has been seen by cardiology with initiation of high-dose stress steroids and is now on colchicine.  Plan to wean oxygen further and PT evaluation with recommendation for acute inpatient rehabilitation noted and patient will require placement.  He appears to be tolerating diet.  ? Getting biopsy done in hospital?  Anticipate discharge once rehab facility bed available.  Assessment & Plan:   Principal Problem:   Postobstructive pneumonia Active Problems:   Metastasis to lung (HCC)   Hyponatremia   Chronic pulmonary embolism (HCC)   Liver cirrhosis (HCC)   DVT (deep venous thrombosis) (HCC)   Leg mass, right   Respiratory failure (HCC)   Malnutrition of moderate degree  Assessment and Plan:  Acute hypoxemic respiratory failure secondary to influenza A  and strep pneumo pneumonia-improving -Tamiflu ongoing with isolation precautions -Unasyn stop date 1/3 -weaned to RA -continue pulm toilet -PRN nebs  Acute  pericarditis secondary to above -Continues on colchicine x 1 month -outpatient cards follow up  Acute metabolic encephalopathy secondary to above-resolved  Widely metastatic malignancy-unknown primary, 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  -Continue full dose Lovenox- change to NOAC once plan for biopsy complete and no need for further surgeries   DVT prophylaxis: Full dose Lovenox Code Status: Full Family Communication:  at bedside 1/1 Disposition Plan:  Status is: Inpatient Remains inpatient appropriate because: Need for d/c plan to CIR   Consultants:  Cardiology PCCM IR CIR    Subjective: No current complaints  Objective: Vitals:   07/23/22 0654 07/23/22 0815 07/23/22 0902 07/23/22 1112  BP:  132/86  113/73  Pulse:    (!) 103  Resp:  20  20  Temp:  (!) 100.8 F (38.2 C)  99.2 F (37.3 C)  TempSrc:  Oral  Oral  SpO2:  94% 100% 100%  Weight: 88.7 kg     Height:        Intake/Output Summary (Last 24 hours) at 07/23/2022 1137 Last data filed at 07/23/2022 0352 Gross per 24 hour  Intake 1340 ml  Output 2780 ml  Net -1440 ml   Filed Weights   07/18/22 0424 07/22/22 0500 07/23/22 0654  Weight: 91.2 kg 91.3 kg 88.7 kg    Examination:    General: Appearance:    Well developed, well nourished male in no acute distress  Lungs:     Off O2, respirations unlabored  Heart:    Tachycardic. Normal rhythm. No murmurs, rubs, or gallops.   MS:   All extremities are intact. Large mass on calf  Neurologic:   Awake, alert, oriented x 3. No apparent focal neurological           defect.        Data Reviewed:  I have personally reviewed following labs and imaging studies  CBC: Recent Labs  Lab 07/16/22 2300 07/16/22 2312 07/17/22 0530 07/17/22 0645 07/19/22 0641 07/20/22 0057 07/21/22 0108 07/22/22 0130 07/23/22 0109  WBC 34.3*  --  35.4*   < > 51.9* 21.7* 19.5* 20.9* 14.6*  NEUTROABS 29.5*  --  30.4*  --   --   --   --   --   --   HGB 11.7*   < > 10.6*   < > 10.2* 9.4* 9.4* 10.5* 10.0*  HCT 34.7*   < > 31.0*   < > 30.8* 29.2* 29.6* 31.4* 29.7*  MCV 109.1*  --  109.5*   < > 110.0* 113.2* 114.7* 111.0* 109.6*  PLT 595*  --  499*   < > 580* 425* 394 389 353   < > = values in this interval not displayed.   Basic Metabolic Panel: Recent Labs  Lab 07/17/22 0900 07/17/22 1604 07/17/22 1610 07/17/22 1805 07/18/22 0647 07/18/22 1709 07/19/22 0641 07/21/22 0108 07/22/22 0130 07/23/22 0109  NA 129*   < >  --  134* 134*  --   --  136 134* 131*  K 3.3*   < >  --  3.9 3.7  --   --  2.9* 4.0 4.0  CL 97*  --   --   --  102  --   --  103 103 100  CO2 24  --   --   --  25  --   --  '24 23 23  '$ GLUCOSE 118*  --   --   --  147*  --   --  110* 101* 108*  BUN 10  --   --   --  15  --   --  '10 7 8  '$ CREATININE 0.71  --  0.61  --  0.66  --   --  0.74 0.68 0.81  CALCIUM 9.7  --   --   --  10.2  --   --  9.4 9.5 9.7  MG 1.3*  --  1.5*  --  1.5* 2.3 2.1 1.3* 1.6*  --   PHOS 2.2*  --  3.6  --  2.7 1.7* 3.1  --   --   --    < > = values in this interval not displayed.   GFR: Estimated Creatinine Clearance: 143.4 mL/min (by C-G formula based on SCr of 0.81 mg/dL). Liver Function Tests: Recent Labs  Lab 07/17/22 0530 07/17/22 0900  AST 38  --   ALT 15  --   ALKPHOS 79  --   BILITOT 2.1*  --   PROT 6.9  --   ALBUMIN 2.4* 2.3*   No results for input(s): "LIPASE", "AMYLASE" in the last 168 hours. No results for input(s): "AMMONIA" in the last 168 hours. Coagulation Profile: No results for input(s): "INR", "PROTIME" in the last 168 hours. Cardiac Enzymes: No results for input(s): "CKTOTAL",  "CKMB", "CKMBINDEX", "TROPONINI" in the last 168 hours. BNP (last 3 results) No results for input(s): "PROBNP" in the last 8760 hours.  HbA1C: No results for input(s): "HGBA1C" in the last 72 hours.  CBG: Recent Labs  Lab 07/22/22 1211 07/22/22 1646 07/22/22 2124 07/23/22 0641 07/23/22 1114  GLUCAP 89 132* 148* 119* 109*   Lipid Profile: No results for input(s): "CHOL", "HDL", "LDLCALC", "TRIG", "CHOLHDL", "LDLDIRECT" in the last 72 hours.  Thyroid Function Tests: No results for input(s): "TSH", "T4TOTAL", "FREET4", "T3FREE", "THYROIDAB" in the last 72 hours. Anemia Panel: No results for input(s): "VITAMINB12", "FOLATE", "FERRITIN", "TIBC", "IRON", "RETICCTPCT" in the last 72 hours. Sepsis Labs: Recent Labs  Lab 07/17/22 0050 07/17/22 0900  LATICACIDVEN 1.8 1.1    Recent Results (from the past 240 hour(s))  Resp panel by RT-PCR (RSV, Flu A&B, Covid) Anterior Nasal Swab     Status: Abnormal   Collection Time: 07/17/22 12:50 AM   Specimen: Anterior Nasal Swab  Result Value Ref Range Status   SARS Coronavirus 2 by RT PCR NEGATIVE NEGATIVE Final    Comment: (NOTE) SARS-CoV-2 target nucleic acids are NOT DETECTED.  The SARS-CoV-2 RNA is generally detectable in upper respiratory specimens during the acute phase of infection. The lowest concentration of SARS-CoV-2 viral copies this assay can detect is 138 copies/mL. A negative result does not preclude SARS-Cov-2 infection and should not be used as the sole basis for treatment or other patient management decisions. A negative result may occur with  improper specimen collection/handling, submission of specimen other than nasopharyngeal swab, presence of viral mutation(s) within the areas targeted by this assay, and inadequate number of viral copies(<138 copies/mL). A negative result must be combined with clinical observations, patient history, and epidemiological information. The expected result is Negative.  Fact Sheet for  Patients:  EntrepreneurPulse.com.au  Fact Sheet for Healthcare Providers:  IncredibleEmployment.be  This test is no t yet approved or cleared by the [place] FDA and  has been authorized for detection and/or diagnosis of SARS-CoV-2 by FDA under an Emergency Use Authorization (EUA). This EUA will remain  in effect (meaning this test can be used) for the duration of the COVID-19 declaration under Section 564(b)(1) of the Act, 21 U.S.C.section 360bbb-3(b)(1), unless the authorization is terminated  or revoked sooner.       Influenza A by PCR POSITIVE (A) NEGATIVE Final   Influenza B by PCR NEGATIVE NEGATIVE Final    Comment: (NOTE) The Xpert Xpress SARS-CoV-2/FLU/RSV plus assay is intended as an aid in the diagnosis of influenza from Nasopharyngeal swab specimens and should not be used as a sole basis for treatment. Nasal washings and aspirates are unacceptable for Xpert Xpress SARS-CoV-2/FLU/RSV testing.  Fact Sheet for Patients: EntrepreneurPulse.com.au  Fact Sheet for Healthcare Providers: IncredibleEmployment.be  This test is not yet approved or cleared by the [place] FDA and has been authorized for detection and/or diagnosis of SARS-CoV-2 by FDA under an Emergency Use Authorization (EUA). This EUA will remain in effect (meaning this test can be used) for the duration of the COVID-19 declaration under Section 564(b)(1) of the Act, 21 U.S.C. section 360bbb-3(b)(1), unless the authorization is terminated or revoked.     Resp Syncytial Virus by PCR NEGATIVE NEGATIVE Final    Comment: (NOTE) Fact Sheet for Patients: EntrepreneurPulse.com.au  Fact Sheet for Healthcare Providers: IncredibleEmployment.be  This test is not yet approved or cleared by the [place] FDA and has been authorized for detection and/or diagnosis of SARS-CoV-2 by FDA under an  Emergency Use Authorization (EUA). This EUA will remain in effect (meaning this test can be used) for the duration of the COVID-19  declaration under Section 564(b)(1) of the Act, 21 U.S.C. section 360bbb-3(b)(1), unless the authorization is terminated or revoked.  Performed at [facility], [place] [address] [place], [place] [address]   Blood culture (routine x 2)     Status: None   Collection Time: 07/17/22 12:50 AM   Specimen: BLOOD LEFT FOREARM  Result Value Ref Range Status   Specimen Description BLOOD LEFT FOREARM  Final   Special Requests   Final    BOTTLES DRAWN AEROBIC ONLY Blood Culture results may not be optimal due to an inadequate volume of blood received in culture bottles   Culture   Final    NO GROWTH 5 DAYS Performed at [facility], [place] [address] [place], [place] [address]    Report Status 07/22/2022 FINAL  Final  Blood culture (routine x 2)     Status: None   Collection Time: 07/17/22 12:50 AM   Specimen: BLOOD RIGHT FOREARM  Result Value Ref Range Status   Specimen Description BLOOD RIGHT FOREARM  Final   Special Requests   Final    BOTTLES DRAWN AEROBIC AND ANAEROBIC Blood Culture adequate volume   Culture   Final    NO GROWTH 5 DAYS Performed at [facility], [place] [address], [place], [place] [address]    Report Status 07/22/2022 FINAL  Final  MRSA Next Gen by PCR, Nasal     Status: None   Collection Time: 07/17/22  6:50 PM   Specimen: Nasal Mucosa; Nasal Swab  Result Value Ref Range Status   MRSA by PCR Next Gen NOT DETECTED NOT DETECTED Final    Comment: (NOTE) The GeneXpert MRSA Assay (FDA approved for NASAL specimens only), is one component of a comprehensive MRSA colonization surveillance program. It is not intended to diagnose MRSA infection nor to guide or monitor treatment for MRSA infections. Test performance is not FDA approved in patients less than 54 years old. Performed at [facility], [place] [address]  [place], [place] [address]          Radiology Studies: [place] CORE BIOPSY (SOFT TISSUE)  Result Date: 07/22/2022 INDICATION: 42 year old with a very large soft tissue mass involving the right calf. Evidence for extensive metastatic disease in the chest. Tissue diagnosis is needed. EXAM: ULTRASOUND-GUIDED BIOPSY OF RIGHT CALF MASS MEDICATIONS: Moderate sedation ANESTHESIA/SEDATION: Moderate (conscious) sedation was employed during this procedure. A total of Versed 1 mg and Fentanyl 75 mcg was administered intravenously by the radiology nurse. Total intra-service moderate Sedation Time: 21 minutes. The patient's level of consciousness and vital signs were monitored continuously by radiology nursing throughout the procedure under my direct supervision. FLUOROSCOPY TIME:  None COMPLICATIONS: None immediate. PROCEDURE: Informed written consent was obtained from the patient after a thorough discussion of the procedural risks, benefits and alternatives. All questions were addressed.A timeout was performed prior to the initiation of the procedure. The right calf was evaluated with ultrasound. There is a large soft tissue mass along the posterior lower aspect of the calf but the skin is very hard and tight in this area. Smaller soft tissue component was targeted in the medial mid calf because the overlying skin was more normal. Skin was prepped with chlorhexidine and sterile field was created. Skin was anesthetized using 1% lidocaine. Using ultrasound guidance, 17 gauge coaxial needle was directed into the soft tissue mass and multiple core biopsies were obtained with an 18 gauge core device. Specimens placed in formalin. 17 gauge coaxial needle  was removed without complication. Bandage placed over the puncture site. FINDINGS: Right calf is markedly enlarged with scattered soft tissue nodules and masses throughout the medial aspect of the right calf. Core biopsy needle was confirmed within the soft tissue mass. IMPRESSION:  Ultrasound-guided core biopsy of right calf soft tissue mass. Electronically Signed   By: Markus Daft M.D.   On: 07/22/2022 17:40        Scheduled Meds:  aspirin  81 mg Oral Daily   budesonide (PULMICORT) nebulizer solution  0.25 mg Nebulization BID   colchicine  0.6 mg Oral BID   enoxaparin (LOVENOX) injection  90 mg Subcutaneous Q12H   famotidine  20 mg Oral BID   folic acid  1 mg Oral Daily   insulin aspart  0-9 Units Subcutaneous TID AC & HS   multivitamin with minerals  1 tablet Oral Daily   thiamine  100 mg Oral Daily   Or   thiamine  100 mg Intravenous Daily   Continuous Infusions:  sodium chloride 250 mL (07/20/22 0555)     LOS: 6 days    Time spent: 35 minutes    Geradine Girt, DO [facility]  If 7PM-7AM, please contact night-coverage [email or web address] 07/23/2022, 11:37 AM

## 2022-07-24 DIAGNOSIS — J189 Pneumonia, unspecified organism: Secondary | ICD-10-CM | POA: Diagnosis not present

## 2022-07-24 LAB — GLUCOSE, CAPILLARY
Glucose-Capillary: 161 mg/dL — ABNORMAL HIGH (ref 70–99)
Glucose-Capillary: 183 mg/dL — ABNORMAL HIGH (ref 70–99)
Glucose-Capillary: 116 mg/dL — ABNORMAL HIGH (ref 70–99)
Glucose-Capillary: 124 mg/dL — ABNORMAL HIGH (ref 70–99)
Glucose-Capillary: 157 mg/dL — ABNORMAL HIGH (ref 70–99)

## 2022-07-24 LAB — BASIC METABOLIC PANEL
Anion gap: 6 (ref 5–15)
BUN: 6 mg/dL (ref 6–20)
CO2: 24 mmol/L (ref 22–32)
Calcium: 10 mg/dL (ref 8.9–10.3)
Chloride: 102 mmol/L (ref 98–111)
Creatinine, Ser: 0.87 mg/dL (ref 0.61–1.24)
GFR, Estimated: >60 mL/min (ref >60)
Glucose, Bld: 104 mg/dL — ABNORMAL HIGH (ref 70–99)
Potassium: 4.3 mmol/L (ref 3.5–5.1)
Sodium: 132 mmol/L — ABNORMAL LOW (ref 135–145)

## 2022-07-24 LAB — CBC
HCT: 32.3 % — ABNORMAL LOW (ref 39.0–52.0)
Hemoglobin: 10.8 g/dL — ABNORMAL LOW (ref 13.0–17.0)
MCH: 36.9 pg — ABNORMAL HIGH (ref 26.0–34.0)
MCHC: 33.4 g/dL (ref 30.0–36.0)
MCV: 110.2 fL — ABNORMAL HIGH (ref 80.0–100.0)
Platelets: 408 10*3/uL — ABNORMAL HIGH (ref 150–400)
RBC: 2.93 MIL/uL — ABNORMAL LOW (ref 4.22–5.81)
RDW: 13.6 % (ref 11.5–15.5)
WBC: 13.6 10*3/uL — ABNORMAL HIGH (ref 4.0–10.5)
nRBC: 0 % (ref 0.0–0.2)

## 2022-07-24 LAB — MAGNESIUM: Magnesium: 1.6 mg/dL — ABNORMAL LOW (ref 1.7–2.4)

## 2022-07-24 MED ORDER — MAGNESIUM SULFATE 2 GM/50ML IV SOLN
2.0000 g | Freq: Once | INTRAVENOUS | Status: AC
  Administered 2022-07-24: 2 g via INTRAVENOUS
  Filled 2022-07-24: qty 50

## 2022-07-24 NOTE — Progress Notes (Signed)
IP rehab admissions - I received a call from sister in [place] stating that patient can come to [place] to her after rehab discharge.  Sister does want to be involved and kept in the loop for progress and treatment options.  I will have my partner follow up.  Not ready for CIR stay at this time.  Call for questions.  [phone]

## 2022-07-24 NOTE — Progress Notes (Signed)
PROGRESS NOTE    [name]  IOE:[id number] DOB: 15-Jan-1981 DOA: 07/16/2022 PCP: Patient, No Pcp Per   Brief Narrative:    42 year old male with known right leg muscle mass and DVT presented to the emergency department on July 17, 2022 early in the morning complaining of shortness of breath and cough. Was found to have influenza and pneumonia. Has been treated with Tamiflu and broad-spectrum antibiotics for concern for bacterial superinfection. Has been receiving oxygen, breathing treatments and has had some wheezing. Developed progressive shortness of breath, mental status changes and became minimally responsive. Pulmonary and critical care medicine was consulted for worsening respiratory failure and change in mental status.  CT angiogram on 12/28 demonstrated multiple large masses bilaterally with chronic thromboembolism.  He was subsequently intubated and transferred to ICU.  He was noted to have diffuse ST elevations in 12/29 consistent with acute pericarditis and has been seen by cardiology with initiation of high-dose stress steroids and is now on colchicine.  Plan to wean oxygen further and PT evaluation with recommendation for acute inpatient rehabilitation noted and patient will require placement.  He appears to be tolerating diet.  ? Getting biopsy done in hospital?  Anticipate discharge once rehab facility bed available.  Assessment & Plan:   Principal Problem:   Postobstructive pneumonia Active Problems:   Metastasis to lung (HCC)   Hyponatremia   Chronic pulmonary embolism (HCC)   Liver cirrhosis (HCC)   DVT (deep venous thrombosis) (HCC)   Leg mass, right   Respiratory failure (HCC)   Malnutrition of moderate degree  Assessment and Plan:  Acute hypoxemic respiratory failure secondary to influenza A  and strep pneumo pneumonia-improving -Tamiflu ongoing with isolation precautions -Unasyn stop date 1/3 -weaned to RA -continue pulm toilet -PRN nebs  Acute  pericarditis secondary to above -Continues on colchicine x 1 month -outpatient cards follow up  Acute metabolic encephalopathy secondary to above-resolved  Widely metastatic malignancy-unknown primary, 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  -Continue full dose Lovenox- change to NOAC once plan for biopsy complete and no need for further surgeries   DVT prophylaxis: Full dose Lovenox Code Status: Full Family Communication:  at bedside 1/1 Disposition Plan:  Status is: Inpatient Remains inpatient appropriate because: Need for d/c plan prior to CIR   Consultants:  Cardiology PCCM IR CIR    Subjective: Breathing better  Objective: Vitals:   07/23/22 2350 07/24/22 0436 07/24/22 0746 07/24/22 0754  BP: 117/79 119/77  122/73  Pulse: 100 100  (!) 115  Resp: '20 20  20  '$ Temp: 98.1 F (36.7 C) 99.3 F (37.4 C)  98.1 F (36.7 C)  TempSrc: Oral Oral  Oral  SpO2: 99% 99% 98% 98%  Weight:  88.7 kg    Height:        Intake/Output Summary (Last 24 hours) at 07/24/2022 1102 Last data filed at 07/24/2022 0848 Gross per 24 hour  Intake 480 ml  Output 2500 ml  Net -2020 ml   Filed Weights   07/22/22 0500 07/23/22 0654 07/24/22 0436  Weight: 91.3 kg 88.7 kg 88.7 kg    Examination:   General: Appearance:    Well developed, well nourished male in no acute distress  Lungs:      respirations unlabored  Heart:    Tachycardic. Normal rhythm. No murmurs, rubs, or gallops.   MS:   All extremities are intact.   Neurologic:   Awake, alert       Data Reviewed: I have personally reviewed following labs and imaging  studies  CBC: Recent Labs  Lab 07/20/22 0057 07/21/22 0108 07/22/22 0130 07/23/22 0109 07/24/22 0113  WBC 21.7* 19.5* 20.9* 14.6* 13.6*  HGB 9.4* 9.4* 10.5* 10.0* 10.8*  HCT 29.2* 29.6* 31.4* 29.7* 32.3*  MCV 113.2* 114.7* 111.0* 109.6* 110.2*  PLT 425* 394 389 353 314*   Basic Metabolic Panel: Recent Labs  Lab 07/17/22 1610 07/17/22 1805 07/18/22 0647 07/18/22 1709 07/19/22 0641 07/21/22 0108 07/22/22 0130 07/23/22 0109 07/24/22 0113  NA  --    < > 134*  --   --  136 134* 131* 132*  K  --    < > 3.7  --   --  2.9* 4.0 4.0 4.3  CL  --   --  102  --   --  103 103 100 102  CO2  --   --  25  --   --  '24 23 23 24  '$ GLUCOSE  --   --  147*  --   --  110* 101* 108* 104*  BUN  --   --  15  --   --  '10 7 8 6  '$ CREATININE 0.61  --  0.66  --   --  0.74 0.68 0.81 0.87  CALCIUM  --   --  10.2  --   --  9.4 9.5 9.7 10.0  MG 1.5*  --  1.5* 2.3 2.1 1.3* 1.6*  --  1.6*  PHOS 3.6  --  2.7 1.7* 3.1  --   --   --   --    < > = values in this interval not displayed.   GFR: Estimated Creatinine Clearance: 133.5 mL/min (by C-G formula based on SCr of 0.87 mg/dL). Liver Function Tests: No results for input(s): "AST", "ALT", "ALKPHOS", "BILITOT", "PROT", "ALBUMIN" in the last 168 hours.  No results for input(s): "LIPASE", "AMYLASE" in the last 168 hours. No results for input(s): "AMMONIA" in the last 168 hours. Coagulation Profile: No results for input(s): "INR", "PROTIME" in the last 168 hours. Cardiac Enzymes: No results for input(s): "CKTOTAL", "CKMB", "CKMBINDEX", "TROPONINI" in the last 168 hours. BNP (last 3 results) No results for input(s): "PROBNP" in the last 8760 hours. HbA1C: No results for input(s): "HGBA1C" in the last 72 hours.  CBG: Recent Labs  Lab 07/23/22 1114 07/23/22 1610 07/23/22 2105 07/24/22 0620 07/24/22 0839  GLUCAP 109* 161* 134* 161* 183*   Lipid Profile: No results for input(s): "CHOL", "HDL", "LDLCALC", "TRIG", "CHOLHDL", "LDLDIRECT" in the last 72  hours.  Thyroid Function Tests: No results for input(s): "TSH", "T4TOTAL", "FREET4", "T3FREE", "THYROIDAB" in the last 72 hours. Anemia Panel: No results for input(s): "VITAMINB12", "FOLATE", "FERRITIN", "TIBC", "IRON", "RETICCTPCT" in the last 72 hours. Sepsis Labs: No results for input(s): "PROCALCITON", "LATICACIDVEN" in the last 168 hours.   Recent Results (from the past 240 hour(s))  Resp panel by RT-PCR (RSV, Flu A&B, Covid) Anterior Nasal Swab     Status: Abnormal   Collection Time: 07/17/22 12:50 AM   Specimen: Anterior Nasal Swab  Result Value Ref Range Status   SARS Coronavirus 2 by RT PCR NEGATIVE NEGATIVE Final    Comment: (NOTE) SARS-CoV-2 target nucleic acids are  NOT DETECTED.  The SARS-CoV-2 RNA is generally detectable in upper respiratory specimens during the acute phase of infection. The lowest concentration of SARS-CoV-2 viral copies this assay can detect is 138 copies/mL. A negative result does not preclude SARS-Cov-2 infection and should not be used as the sole basis for treatment or other patient management decisions. A negative result may occur with  improper specimen collection/handling, submission of specimen other than nasopharyngeal swab, presence of viral mutation(s) within the areas targeted by this assay, and inadequate number of viral copies(<138 copies/mL). A negative result must be combined with clinical observations, patient history, and epidemiological information. The expected result is Negative.  Fact Sheet for Patients:  EntrepreneurPulse.com.au  Fact Sheet for Healthcare Providers:  IncredibleEmployment.be  This test is no t yet approved or cleared by the [place] FDA and  has been authorized for detection and/or diagnosis of SARS-CoV-2 by FDA under an Emergency Use Authorization (EUA). This EUA will remain  in effect (meaning this test can be used) for the duration of the COVID-19 declaration  under Section 564(b)(1) of the Act, 21 U.S.C.section 360bbb-3(b)(1), unless the authorization is terminated  or revoked sooner.       Influenza A by PCR POSITIVE (A) NEGATIVE Final   Influenza B by PCR NEGATIVE NEGATIVE Final    Comment: (NOTE) The Xpert Xpress SARS-CoV-2/FLU/RSV plus assay is intended as an aid in the diagnosis of influenza from Nasopharyngeal swab specimens and should not be used as a sole basis for treatment. Nasal washings and aspirates are unacceptable for Xpert Xpress SARS-CoV-2/FLU/RSV testing.  Fact Sheet for Patients: EntrepreneurPulse.com.au  Fact Sheet for Healthcare Providers: IncredibleEmployment.be  This test is not yet approved or cleared by the [place] FDA and has been authorized for detection and/or diagnosis of SARS-CoV-2 by FDA under an Emergency Use Authorization (EUA). This EUA will remain in effect (meaning this test can be used) for the duration of the COVID-19 declaration under Section 564(b)(1) of the Act, 21 U.S.C. section 360bbb-3(b)(1), unless the authorization is terminated or revoked.     Resp Syncytial Virus by PCR NEGATIVE NEGATIVE Final    Comment: (NOTE) Fact Sheet for Patients: EntrepreneurPulse.com.au  Fact Sheet for Healthcare Providers: IncredibleEmployment.be  This test is not yet approved or cleared by the [place] FDA and has been authorized for detection and/or diagnosis of SARS-CoV-2 by FDA under an Emergency Use Authorization (EUA). This EUA will remain in effect (meaning this test can be used) for the duration of the COVID-19 declaration under Section 564(b)(1) of the Act, 21 U.S.C. section 360bbb-3(b)(1), unless the authorization is terminated or revoked.  Performed at [facility], [place] [address] [place], [place] [address]   Blood culture (routine x 2)     Status: None   Collection Time: 07/17/22 12:50 AM   Specimen:  BLOOD LEFT FOREARM  Result Value Ref Range Status   Specimen Description BLOOD LEFT FOREARM  Final   Special Requests   Final    BOTTLES DRAWN AEROBIC ONLY Blood Culture results may not be optimal due to an inadequate volume of blood received in culture bottles   Culture   Final    NO GROWTH 5 DAYS Performed at [facility], [place] [address] [place], [place] [address]    Report Status 07/22/2022 FINAL  Final  Blood culture (routine x 2)     Status: None   Collection Time: 07/17/22 12:50 AM   Specimen: BLOOD RIGHT FOREARM  Result Value Ref Range  Status   Specimen Description BLOOD RIGHT FOREARM  Final   Special Requests   Final    BOTTLES DRAWN AEROBIC AND ANAEROBIC Blood Culture adequate volume   Culture   Final    NO GROWTH 5 DAYS Performed at [facility], [address] [place], [place] [address]    Report Status 07/22/2022 FINAL  Final  MRSA Next Gen by PCR, Nasal     Status: None   Collection Time: 07/17/22  6:50 PM   Specimen: Nasal Mucosa; Nasal Swab  Result Value Ref Range Status   MRSA by PCR Next Gen NOT DETECTED NOT DETECTED Final    Comment: (NOTE) The GeneXpert MRSA Assay (FDA approved for NASAL specimens only), is one component of a comprehensive MRSA colonization surveillance program. It is not intended to diagnose MRSA infection nor to guide or monitor treatment for MRSA infections. Test performance is not FDA approved in patients less than 40 years old. Performed at [facility], [place] [address], [place], [place] [address]          Radiology Studies: [place] CORE BIOPSY (SOFT TISSUE)  Result Date: 07/22/2022 INDICATION: 42 year old with a very large soft tissue mass involving the right calf. Evidence for extensive metastatic disease in the chest. Tissue diagnosis is needed. EXAM: ULTRASOUND-GUIDED BIOPSY OF RIGHT CALF MASS MEDICATIONS: Moderate sedation ANESTHESIA/SEDATION: Moderate (conscious) sedation was employed during this procedure. A  total of Versed 1 mg and Fentanyl 75 mcg was administered intravenously by the radiology nurse. Total intra-service moderate Sedation Time: 21 minutes. The patient's level of consciousness and vital signs were monitored continuously by radiology nursing throughout the procedure under my direct supervision. FLUOROSCOPY TIME:  None COMPLICATIONS: None immediate. PROCEDURE: Informed written consent was obtained from the patient after a thorough discussion of the procedural risks, benefits and alternatives. All questions were addressed.A timeout was performed prior to the initiation of the procedure. The right calf was evaluated with ultrasound. There is a large soft tissue mass along the posterior lower aspect of the calf but the skin is very hard and tight in this area. Smaller soft tissue component was targeted in the medial mid calf because the overlying skin was more normal. Skin was prepped with chlorhexidine and sterile field was created. Skin was anesthetized using 1% lidocaine. Using ultrasound guidance, 17 gauge coaxial needle was directed into the soft tissue mass and multiple core biopsies were obtained with an 18 gauge core device. Specimens placed in formalin. 17 gauge coaxial needle was removed without complication. Bandage placed over the puncture site. FINDINGS: Right calf is markedly enlarged with scattered soft tissue nodules and masses throughout the medial aspect of the right calf. Core biopsy needle was confirmed within the soft tissue mass. IMPRESSION: Ultrasound-guided core biopsy of right calf soft tissue mass. Electronically Signed   By: Markus Daft M.D.   On: 07/22/2022 17:40        Scheduled Meds:  aspirin  81 mg Oral Daily   budesonide (PULMICORT) nebulizer solution  0.25 mg Nebulization BID   colchicine  0.6 mg Oral BID   enoxaparin (LOVENOX) injection  90 mg Subcutaneous Q12H   famotidine  20 mg Oral BID   folic acid  1 mg Oral Daily   insulin aspart  0-9 Units Subcutaneous TID  AC & HS   multivitamin with minerals  1 tablet Oral Daily   thiamine  100 mg Oral Daily   Or   thiamine  100 mg Intravenous Daily  Continuous Infusions:  sodium chloride 250 mL (07/20/22 0555)   magnesium sulfate bolus IVPB       LOS: 7 days    Time spent: 35 minutes    Geradine Girt, DO [facility]  If 7PM-7AM, please contact night-coverage [email or web address] 07/24/2022, 11:02 AM

## 2022-07-24 NOTE — Progress Notes (Signed)
PT Cancellation Note  Patient Details Name: [name] MRN: [id number] DOB: Oct 21, 1980   Cancelled Treatment:    Reason Eval/Treat Not Completed: Other (comment) (Pt had visitors and declined participation.)  Tomma Rakers, DPT, [place] Office: [phone] (Secure chat preferred)  Ander Purpura 07/24/2022, 5:33 PM

## 2022-07-25 DIAGNOSIS — J189 Pneumonia, unspecified organism: Secondary | ICD-10-CM | POA: Diagnosis not present

## 2022-07-25 LAB — CBC
HCT: 32.4 % — ABNORMAL LOW (ref 39.0–52.0)
Hemoglobin: 11 g/dL — ABNORMAL LOW (ref 13.0–17.0)
MCH: 37.2 pg — ABNORMAL HIGH (ref 26.0–34.0)
MCHC: 34 g/dL (ref 30.0–36.0)
MCV: 109.5 fL — ABNORMAL HIGH (ref 80.0–100.0)
Platelets: 331 10*3/uL (ref 150–400)
RBC: 2.96 MIL/uL — ABNORMAL LOW (ref 4.22–5.81)
RDW: 13.6 % (ref 11.5–15.5)
WBC: 12.7 10*3/uL — ABNORMAL HIGH (ref 4.0–10.5)
nRBC: 0 % (ref 0.0–0.2)

## 2022-07-25 LAB — GLUCOSE, CAPILLARY
Glucose-Capillary: 95 mg/dL (ref 70–99)
Glucose-Capillary: 154 mg/dL — ABNORMAL HIGH (ref 70–99)
Glucose-Capillary: 221 mg/dL — ABNORMAL HIGH (ref 70–99)
Glucose-Capillary: 134 mg/dL — ABNORMAL HIGH (ref 70–99)

## 2022-07-25 MED ORDER — ENSURE ENLIVE PO LIQD
237.0000 mL | Freq: Two times a day (BID) | ORAL | Status: DC
  Administered 2022-07-25 – 2022-08-05 (×12): 237 mL via ORAL

## 2022-07-25 NOTE — Progress Notes (Signed)
Physical Therapy Treatment Patient Details Name: [name] MRN: [id number] DOB: 17-Jan-1981 Today's Date: 07/25/2022   History of Present Illness Pt is a 42 y.o. male admitted 07/17/22 with SOB, cough; workup for influenza PNA. Pt with worsening respiratory status and AMS; transfer to ICU. Chest CTA with chronic PE, innumerable pulmonary masses bilaterally; suspect widespread pulmonary metastatic disease. Acute pericarditis 12/29. ETT 12/28-12/30. S/p R calf mass biopsy 1/2. PMH includes RLE mass and DVT, tooth infection.   PT Comments    Pt progressing with mobility. Today's session focused on transfer and gait training with and without; pt ambulating without DME at supervision-level, though he prefers use of walker for balance. Discussed updated d/c recommendations with pt who reports preference is return home and to work; pt plans to stay with sister in [place]. Notified CM and AIR AC of updated d/c recs. Will continue to follow acutely to address established goals.  HR up 137 with ambulation, DOE 3/4    Recommendations for follow up therapy are one component of a multi-disciplinary discharge planning process, led by the attending physician.  Recommendations may be updated based on patient status, additional functional criteria and insurance authorization.  Follow Up Recommendations  Outpatient PT     Assistance Recommended at Discharge Intermittent Supervision/Assistance  Patient can return home with the following A little help with bathing/dressing/bathroom;Assistance with cooking/housework;Assist for transportation;Direct supervision/assist for medications management;Direct supervision/assist for financial management   Equipment Recommendations  Rolling walker (2 wheels)    Recommendations for Other Services  Mobility Specialist     Precautions / Restrictions Precautions Precautions: Fall;Other (comment) Precaution Comments: watch HR; known RLE swelling (awaiting biopsy  results) Restrictions Weight Bearing Restrictions: No     Mobility  Bed Mobility Overal bed mobility: Modified Independent Bed Mobility: Supine to Sit, Sit to Supine                Transfers Overall transfer level: Needs assistance Equipment used: Rolling walker (2 wheels), None Transfers: Sit to/from Stand Sit to Stand: Supervision           General transfer comment: able to stand from EOB with and without RW, supervision for safety    Ambulation/Gait Ambulation/Gait assistance: Supervision Gait Distance (Feet): 180 Feet (+ 24) Assistive device: Rolling walker (2 wheels), None Gait Pattern/deviations: Step-through pattern, Decreased stride length, Decreased weight shift to right, Trunk flexed Gait velocity: decreased     General Gait Details: slow, steady hallway ambulation with RW, intermittent cues to maintain closer proximity to RW and upright posture; pt declining further distance, requesting seated rest due to SOB (pt wearing mask for hallway ambulation). pt able to walk lap in room without DME, supervision for safety, declined further distance   Stairs             Wheelchair Mobility    Modified Rankin (Stroke Patients Only)       Balance Overall balance assessment: Needs assistance Sitting-balance support: Feet supported, No upper extremity supported Sitting balance-Leahy Scale: Good     Standing balance support: No upper extremity supported, During functional activity Standing balance-Leahy Scale: Fair Standing balance comment: ambulatory without DME, though preference for RW                            Cognition Arousal/Alertness: Awake/alert Behavior During Therapy: Flat affect Overall Cognitive Status: No family/caregiver present to determine baseline cognitive functioning Area of Impairment: Attention, Safety/judgement, Awareness  Current Attention Level: Selective     Safety/Judgement:  Decreased awareness of deficits Awareness: Emergent            Exercises      General Comments General comments (skin integrity, edema, etc.): HR up to 137 with activity. initiated discussion about potential return home instead of AIR - pt reports likely to d/c to sister's home in [place]      Pertinent Vitals/Pain Pain Assessment Pain Assessment: Faces Faces Pain Scale: Hurts a little bit Pain Location: RLE Pain Descriptors / Indicators: Heaviness Pain Intervention(s): Monitored during session    Home Living                          Prior Function            PT Goals (current goals can now be found in the care plan section) Acute Rehab PT Goals PT Goal Formulation: With patient Progress towards PT goals: Progressing toward goals    Frequency    Min 3X/week      PT Plan Discharge plan needs to be updated    Co-evaluation              AM-PAC PT "6 Clicks" Mobility   Outcome Measure  Help needed turning from your back to your side while in a flat bed without using bedrails?: None Help needed moving from lying on your back to sitting on the side of a flat bed without using bedrails?: None Help needed moving to and from a bed to a chair (including a wheelchair)?: A Little Help needed standing up from a chair using your arms (e.g., wheelchair or bedside chair)?: A Little Help needed to walk in hospital room?: A Little Help needed climbing 3-5 steps with a railing? : A Little 6 Click Score: 20    End of Session Equipment Utilized During Treatment: Gait belt Activity Tolerance: Patient tolerated treatment well Patient left: in bed;with call bell/phone within reach Nurse Communication: Mobility status PT Visit Diagnosis: Other abnormalities of gait and mobility (R26.89);Muscle weakness (generalized) (M62.81);Unsteadiness on feet (R26.81);Difficulty in walking, not elsewhere classified (R26.2)     Time: 5670-1410 PT Time Calculation (min) (ACUTE  ONLY): 15 min  Charges:  $Gait Training: 8-22 mins                      Mabeline Caras, PT, DPT Acute Rehabilitation Services  Personal: [facility] Office: [place] 07/25/2022, 1:05 PM

## 2022-07-25 NOTE — Progress Notes (Signed)
Inpatient Rehab Admissions Coordinator:    CIR following, Pt. Not ready as we await biopsy results and treatment plan. Per PT, they may be updating recs to home. I will follow up once PT note is in.   Clemens Catholic, [place], [place] Admissions Coordinator  [phone] ([place]) [phone] (office)

## 2022-07-25 NOTE — Progress Notes (Signed)
PROGRESS NOTE    Marquies Wanat  BHA:[id number] DOB: May 03, 1981 DOA: 07/16/2022 PCP: Patient, No Pcp Per   Brief Narrative:    42 year old male with known right leg muscle mass and DVT presented to the emergency department on July 17, 2022 early in the morning complaining of shortness of breath and cough. Was found to have influenza and pneumonia. Has been treated with Tamiflu and broad-spectrum antibiotics for concern for bacterial superinfection. Developed progressive shortness of breath, mental status changes and became minimally responsive. Pulmonary and critical care medicine was consulted for worsening respiratory failure and change in mental status.  CT angiogram on 12/28 demonstrated multiple large masses bilaterally with chronic thromboembolism.  He was subsequently intubated and transferred to ICU.  He was noted to have diffuse ST elevations in 12/29 consistent with acute pericarditis and has been seen by cardiology with initiation of high-dose stress steroids and is now on colchicine.  Anticipate discharge once rehab facility bed available and biopsy resulted with plan for treatment if needed.  Assessment & Plan:   Principal Problem:   Postobstructive pneumonia Active Problems:   Metastasis to lung (HCC)   Hyponatremia   Chronic pulmonary embolism (HCC)   Liver cirrhosis (HCC)   DVT (deep venous thrombosis) (HCC)   Leg mass, right   Respiratory failure (HCC)   Malnutrition of moderate degree   Assessment and Plan:  Acute hypoxemic respiratory failure secondary to influenza A  and strep pneumo pneumonia-improving -Tamiflu ongoing with isolation precautions -Unasyn stop date 1/3 -weaned to RA -continue pulm toilet -PRN nebs  Acute pericarditis secondary to above -Continues on colchicine x 1 month -outpatient cards follow up  Acute metabolic encephalopathy secondary to above -resolved  Widely metastatic malignancy-unknown primary, 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  -Continue full dose Lovenox- change to NOAC once plan for biopsy complete and no need for further surgeries   DVT prophylaxis: Full dose Lovenox Code Status: Full Family Communication:  at bedside 1/1 Disposition Plan:  Status is: Inpatient Remains inpatient appropriate because: Need for d/c plan for biopsy prior to CIR bed   Consultants:  Cardiology PCCM IR CIR    Subjective: No overnight event  Objective: Vitals:   07/25/22 0004 07/25/22 0315 07/25/22 0752 07/25/22 0834  BP: 110/65 115/72 120/74   Pulse: (!) 105 (!) 108 100   Resp: '20 20 20   '$ Temp: 97.8 F (36.6 C) 98.5 F (36.9 C) 98.5 F (36.9 C)   TempSrc: Oral Oral Oral   SpO2: 97% 98% 98% 98%  Weight:  88.5 kg    Height:        Intake/Output Summary (Last 24 hours) at 07/25/2022 1017 Last data filed at 07/25/2022 0845 Gross per 24 hour  Intake 410 ml  Output 2675 ml  Net -2265 ml   Filed Weights   07/23/22 0654 07/24/22 0436 07/25/22 0315  Weight: 88.7 kg 88.7 kg 88.5 kg    Examination:    General: Appearance:    Well developed, well nourished male in no acute distress     Lungs:     respirations unlabored  Heart:    Tachycardic. regular  MS:   All extremities are intact. Large mass on  calf (right)  Neurologic:   Awake, alert, oriented x 3. No apparent focal neurological           defect.        Data Reviewed: I have personally reviewed following labs and imaging studies  CBC: Recent Labs  Lab 07/21/22 0108 07/22/22 0130 07/23/22 0109 07/24/22 0113 07/25/22 0204  WBC 19.5* 20.9* 14.6* 13.6* 12.7*  HGB 9.4* 10.5*  10.0* 10.8* 11.0*  HCT 29.6* 31.4* 29.7* 32.3* 32.4*  MCV 114.7* 111.0* 109.6* 110.2* 109.5*  PLT 394 389 353 408* 947   Basic Metabolic Panel: Recent Labs  Lab 07/18/22 1709 07/19/22 0641 07/21/22 0108 07/22/22 0130 07/23/22 0109 07/24/22 0113  NA  --   --  136 134* 131* 132*  K  --   --  2.9* 4.0 4.0 4.3  CL  --   --  103 103 100 102  CO2  --   --  '24 23 23 24  '$ GLUCOSE  --   --  110* 101* 108* 104*  BUN  --   --  '10 7 8 6  '$ CREATININE  --   --  0.74 0.68 0.81 0.87  CALCIUM  --   --  9.4 9.5 9.7 10.0  MG 2.3 2.1 1.3* 1.6*  --  1.6*  PHOS 1.7* 3.1  --   --   --   --    GFR: Estimated Creatinine Clearance: 133.5 mL/min (by C-G formula based on SCr of 0.87 mg/dL). Liver Function Tests: No results for input(s): "AST", "ALT", "ALKPHOS", "BILITOT", "PROT", "ALBUMIN" in the last 168 hours.  No results for input(s): "LIPASE", "AMYLASE" in the last 168 hours. No results for input(s): "AMMONIA" in the last 168 hours. Coagulation Profile: No results for input(s): "INR", "PROTIME" in the last 168 hours. Cardiac Enzymes: No results for input(s): "CKTOTAL", "CKMB", "CKMBINDEX", "TROPONINI" in the last 168 hours. BNP (last 3 results) No results for input(s): "PROBNP" in the last 8760 hours. HbA1C: No results for input(s): "HGBA1C" in the last 72 hours.  CBG: Recent Labs  Lab 07/24/22 0839 07/24/22 1123 07/24/22 1650 07/24/22 2143 07/25/22 0648  GLUCAP 183* 116* 124* 157* 95   Lipid Profile: No results for input(s): "CHOL", "HDL", "LDLCALC", "TRIG", "CHOLHDL", "LDLDIRECT" in the last 72 hours.  Thyroid Function Tests: No results for input(s): "TSH", "T4TOTAL", "FREET4", "T3FREE", "THYROIDAB" in the last 72 hours. Anemia Panel: No results for input(s): "VITAMINB12", "FOLATE", "FERRITIN", "TIBC", "IRON", "RETICCTPCT" in the last 72 hours. Sepsis Labs: No results for input(s): "PROCALCITON", "LATICACIDVEN" in the last 168 hours.   Recent Results (from the past 240 hour(s))   Resp panel by RT-PCR (RSV, Flu A&B, Covid) Anterior Nasal Swab     Status: Abnormal   Collection Time: 07/17/22 12:50 AM   Specimen: Anterior Nasal Swab  Result Value Ref Range Status   SARS Coronavirus 2 by RT PCR NEGATIVE NEGATIVE Final    Comment: (NOTE) SARS-CoV-2 target nucleic acids are NOT DETECTED.  The SARS-CoV-2 RNA is generally detectable in upper respiratory specimens during the acute phase of infection. The lowest concentration of SARS-CoV-2 viral copies this assay can detect is 138 copies/mL. A negative result does not preclude SARS-Cov-2 infection and should not be used as the sole basis for treatment or other patient management decisions. A negative result may occur with  improper specimen collection/handling, submission of specimen other than nasopharyngeal swab, presence of viral mutation(s) within the areas targeted by this assay, and inadequate number of viral copies(<138 copies/mL). A  negative result must be combined with clinical observations, patient history, and epidemiological information. The expected result is Negative.  Fact Sheet for Patients:  EntrepreneurPulse.com.au  Fact Sheet for Healthcare Providers:  IncredibleEmployment.be  This test is no t yet approved or cleared by the [place] FDA and  has been authorized for detection and/or diagnosis of SARS-CoV-2 by FDA under an Emergency Use Authorization (EUA). This EUA will remain  in effect (meaning this test can be used) for the duration of the COVID-19 declaration under Section 564(b)(1) of the Act, 21 U.S.C.section 360bbb-3(b)(1), unless the authorization is terminated  or revoked sooner.       Influenza A by PCR POSITIVE (A) NEGATIVE Final   Influenza B by PCR NEGATIVE NEGATIVE Final    Comment: (NOTE) The Xpert Xpress SARS-CoV-2/FLU/RSV plus assay is intended as an aid in the diagnosis of influenza from Nasopharyngeal swab specimens and should not  be used as a sole basis for treatment. Nasal washings and aspirates are unacceptable for Xpert Xpress SARS-CoV-2/FLU/RSV testing.  Fact Sheet for Patients: EntrepreneurPulse.com.au  Fact Sheet for Healthcare Providers: IncredibleEmployment.be  This test is not yet approved or cleared by the [place] FDA and has been authorized for detection and/or diagnosis of SARS-CoV-2 by FDA under an Emergency Use Authorization (EUA). This EUA will remain in effect (meaning this test can be used) for the duration of the COVID-19 declaration under Section 564(b)(1) of the Act, 21 U.S.C. section 360bbb-3(b)(1), unless the authorization is terminated or revoked.     Resp Syncytial Virus by PCR NEGATIVE NEGATIVE Final    Comment: (NOTE) Fact Sheet for Patients: EntrepreneurPulse.com.au  Fact Sheet for Healthcare Providers: IncredibleEmployment.be  This test is not yet approved or cleared by the [place] FDA and has been authorized for detection and/or diagnosis of SARS-CoV-2 by FDA under an Emergency Use Authorization (EUA). This EUA will remain in effect (meaning this test can be used) for the duration of the COVID-19 declaration under Section 564(b)(1) of the Act, 21 U.S.C. section 360bbb-3(b)(1), unless the authorization is terminated or revoked.  Performed at [facility], [place] [address], [place], [place] [address]   Blood culture (routine x 2)     Status: None   Collection Time: 07/17/22 12:50 AM   Specimen: BLOOD LEFT FOREARM  Result Value Ref Range Status   Specimen Description BLOOD LEFT FOREARM  Final   Special Requests   Final    BOTTLES DRAWN AEROBIC ONLY Blood Culture results may not be optimal due to an inadequate volume of blood received in culture bottles   Culture   Final    NO GROWTH 5 DAYS Performed at [facility], [place] [address] [place], [place] [address]    Report Status  07/22/2022 FINAL  Final  Blood culture (routine x 2)     Status: None   Collection Time: 07/17/22 12:50 AM   Specimen: BLOOD RIGHT FOREARM  Result Value Ref Range Status   Specimen Description BLOOD RIGHT FOREARM  Final   Special Requests   Final    BOTTLES DRAWN AEROBIC AND ANAEROBIC Blood Culture adequate volume   Culture   Final    NO GROWTH 5 DAYS Performed at [facility], [place] [address], [place], [place] [address]    Report Status 07/22/2022 FINAL  Final  MRSA Next Gen by PCR, Nasal     Status: None   Collection Time: 07/17/22  6:50 PM   Specimen: Nasal Mucosa; Nasal Swab  Result Value Ref Range Status   MRSA by PCR Next Gen NOT DETECTED NOT DETECTED Final    Comment: (NOTE) The GeneXpert MRSA Assay (FDA approved for NASAL specimens only), is one component of a comprehensive MRSA colonization surveillance program. It is not intended to diagnose MRSA infection nor to guide or monitor treatment for MRSA infections. Test performance is not FDA approved in patients less than 47 years old. Performed at [facility], [place] [address] [place], [place] [address]          Radiology Studies: No results found.      Scheduled Meds:  aspirin  81 mg Oral Daily   budesonide (PULMICORT) nebulizer solution  0.25 mg Nebulization BID   colchicine  0.6 mg Oral BID   enoxaparin (LOVENOX) injection  90 mg Subcutaneous Q12H   famotidine  20 mg Oral BID   folic acid  1 mg Oral Daily   insulin aspart  0-9 Units Subcutaneous TID AC & HS   multivitamin with minerals  1 tablet Oral Daily   thiamine  100 mg Oral Daily   Or   thiamine  100 mg Intravenous Daily   Continuous Infusions:  sodium chloride 250 mL (07/20/22 0555)     LOS: 8 days    Time spent: 35 minutes    Geradine Girt, DO [facility]  If 7PM-7AM, please contact night-coverage [email or web address] 07/25/2022, 10:17 AM

## 2022-07-26 ENCOUNTER — Inpatient Hospital Stay (HOSPITAL_COMMUNITY): Payer: BC Managed Care – PPO

## 2022-07-26 ENCOUNTER — Other Ambulatory Visit: Payer: Self-pay

## 2022-07-26 DIAGNOSIS — E440 Moderate protein-calorie malnutrition: Secondary | ICD-10-CM

## 2022-07-26 DIAGNOSIS — I2782 Chronic pulmonary embolism: Secondary | ICD-10-CM | POA: Diagnosis not present

## 2022-07-26 DIAGNOSIS — J189 Pneumonia, unspecified organism: Secondary | ICD-10-CM | POA: Diagnosis not present

## 2022-07-26 DIAGNOSIS — E871 Hypo-osmolality and hyponatremia: Secondary | ICD-10-CM | POA: Diagnosis not present

## 2022-07-26 DIAGNOSIS — R2241 Localized swelling, mass and lump, right lower limb: Secondary | ICD-10-CM | POA: Diagnosis not present

## 2022-07-26 LAB — GLUCOSE, CAPILLARY
Glucose-Capillary: 100 mg/dL — ABNORMAL HIGH (ref 70–99)
Glucose-Capillary: 135 mg/dL — ABNORMAL HIGH (ref 70–99)
Glucose-Capillary: 223 mg/dL — ABNORMAL HIGH (ref 70–99)
Glucose-Capillary: 154 mg/dL — ABNORMAL HIGH (ref 70–99)

## 2022-07-26 LAB — CBC
HCT: 30.8 % — ABNORMAL LOW (ref 39.0–52.0)
Hemoglobin: 10.3 g/dL — ABNORMAL LOW (ref 13.0–17.0)
MCH: 36.5 pg — ABNORMAL HIGH (ref 26.0–34.0)
MCHC: 33.4 g/dL (ref 30.0–36.0)
MCV: 109.2 fL — ABNORMAL HIGH (ref 80.0–100.0)
Platelets: 331 10*3/uL (ref 150–400)
RBC: 2.82 MIL/uL — ABNORMAL LOW (ref 4.22–5.81)
RDW: 13.5 % (ref 11.5–15.5)
WBC: 13 10*3/uL — ABNORMAL HIGH (ref 4.0–10.5)
nRBC: 0 % (ref 0.0–0.2)

## 2022-07-26 LAB — MAGNESIUM: Magnesium: 1.6 mg/dL — ABNORMAL LOW (ref 1.7–2.4)

## 2022-07-26 MED ORDER — ACETAMINOPHEN 325 MG PO TABS
650.0000 mg | ORAL_TABLET | Freq: Four times a day (QID) | ORAL | Status: DC | PRN
  Administered 2022-07-27 – 2022-08-05 (×10): 650 mg via ORAL
  Filled 2022-07-26 (×10): qty 2

## 2022-07-26 MED ORDER — SODIUM CHLORIDE 0.9 % IV BOLUS
1000.0000 mL | Freq: Once | INTRAVENOUS | Status: AC
  Administered 2022-07-26: 1000 mL via INTRAVENOUS

## 2022-07-26 MED ORDER — MAGNESIUM SULFATE 2 GM/50ML IV SOLN
2.0000 g | Freq: Once | INTRAVENOUS | Status: AC
  Administered 2022-07-26: 2 g via INTRAVENOUS
  Filled 2022-07-26: qty 50

## 2022-07-26 NOTE — Hospital Course (Signed)
[name] is a 42 y.o. male with a history of right leg mass, DVT, PE. Patient presented secondary to shortness of breath and cough and was found to have influenza infection and pneumonia. Patient developed worsening hypoxia complicated by worsening mentation requiring ICU admission and intubation. Patient managed on Tamiflu for Influenza A infection and empiric antibiotics community-acquired pneumonia followed by eventual transition to Unasyn after positive streptococcus pneumoniae urine antigen test. During admission, patient developed diffuse ST elevation concerning for acute pericarditis, managed initially with steroids and transitioned to colchicine.

## 2022-07-26 NOTE — Progress Notes (Signed)
Patient's HR sustained in the high 120s- low 130s. Patient asymptomatic. This RN paged the MD to notify. Awaiting response.  [name]

## 2022-07-26 NOTE — Progress Notes (Signed)
PROGRESS NOTE    Viraaj Vorndran  QIO:[id number] DOB: Jul 16, 1981 DOA: 07/16/2022 PCP: Patient, No Pcp Per   Brief Narrative: Riku Buttery is a 42 y.o. male with a history of right leg mass, DVT, PE. Patient presented secondary to shortness of breath and cough and was found to have influenza infection and pneumonia. Patient developed worsening hypoxia complicated by worsening mentation requiring ICU admission and intubation. Patient managed on Tamiflu for Influenza A infection and empiric antibiotics community-acquired pneumonia followed by eventual transition to Unasyn after positive streptococcus pneumoniae urine antigen test. During admission, patient developed diffuse ST elevation concerning for acute pericarditis, managed initially with steroids and transitioned to colchicine.   Assessment and Plan:  Acute respiratory failure with hypoxia Secondary to pneumonia. Patient was admitted by critical care service on 06/2822, admitted to the ICU and eventual intubated day of admission. Patient managed on mechanical ventilation until 07/19/22 when he was successfully extubated. Patient weaned to room air.  Influenza A infection Noted on admission. Patient completed oseltamivir course.  Community acquired pneumonia Secondary to streptococcus pneumoniae. Patient treated empirically with Vancomycin and Zosyn before transitioning to Ceftriaxone/azithromycin and finally to Unasyn to complete a 7-day treatment course. Resolved.  Acute metabolic encephalopathy Secondary to acute illness. Resolved.  Acute pericarditis Likely secondary to viral illness. Transthoracic Echocardiogram with low-normal LVEF of 50-55% and no regional wall motion abnormalities. Patient managed initially with high-dose steroids and transitioned to Colchicine. -Cardiology recommendations; Colchicine x1 month and outpatient follow-up  Fever Tmax of 100.4 F on 1/6. No new symptoms. Possibly non-infectious and related to  extensive tumor burden. Associated leukocytosis has been stable. 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 Associated numerous pulmonary lesions. No metastatic/primary lesions noted in CT abdomen/pelvis. US guided core biopsy of right calf mass performed on 1/2. Pathology is pending.  Liver cirrhosis No ascites noted on CT imaging.  Hypokalemia Resolved with repletion. Complicated by hypomagnesemia.  Hypomagnesemia -Recheck magnesium and replete if needed  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 -Continue Lovenox with plan to transition to [place] prior to discharge   DVT prophylaxis: Lovenox Code Status:   Code Status: Full Code Family Communication: None at bedside Disposition Plan: Discharge home likely in 1-3 days pending possible need for fever workup   Consultants:  PCCM Cardiology  Procedures:  07/17/22: Endotracheal intubation 07/19/22: Extubation  Antimicrobials: Vancomycin Zosyn Tamiflu Ceftriaxone Azithromycin Unasyn    Subjective: Patient reports no issues this morning. He has some dyspnea/fatigue with ambulation, otherwise no concerns. Febrile overnight with Tmax of 100.4 F.  Objective: BP 114/75 (BP Location: Left Arm)   Pulse 76   Temp 97.8 F (36.6 C) (Oral)   Resp 20   Ht '6\' 3"'$  (1.905 m)   Wt 87.3 kg   SpO2 94%   BMI 24.06 kg/m   Examination:  General exam: Appears calm and comfortable Respiratory system: Clear to auscultation. Respiratory effort normal. Cardiovascular system: S1 & S2 heard, fast rate with normal rhythm. Gastrointestinal system: Abdomen is nondistended, soft and nontender. Normal bowel sounds heard. Central nervous system: Alert and oriented. No focal neurological deficits. Musculoskeletal: No edema. Large right lower leg mass Skin: No cyanosis. No rashes Psychiatry: Judgement and insight appear normal. Mood & affect appropriate.    Data  Reviewed: I have personally reviewed following labs and imaging studies  CBC Lab Results  Component Value Date   WBC 13.0 (H) 07/26/2022   RBC 2.82 (L) 07/26/2022   HGB 10.3 (L)  07/26/2022   HCT 30.8 (L) 07/26/2022   MCV 109.2 (H) 07/26/2022   MCH 36.5 (H) 07/26/2022   PLT 331 07/26/2022   MCHC 33.4 07/26/2022   RDW 13.5 07/26/2022   LYMPHSABS 1.8 07/17/2022   MONOABS 3.2 (H) 07/17/2022   EOSABS 0.0 07/17/2022   BASOSABS 0.0 37/85/8850     Last metabolic panel Lab Results  Component Value Date   NA 132 (L) 07/24/2022   K 4.3 07/24/2022   CL 102 07/24/2022   CO2 24 07/24/2022   BUN 6 07/24/2022   CREATININE 0.87 07/24/2022   GLUCOSE 104 (H) 07/24/2022   GFRNONAA >60 07/24/2022   GFRAA >60 06/11/2015   CALCIUM 10.0 07/24/2022   PHOS 3.1 07/19/2022   PROT 6.9 07/17/2022   ALBUMIN 2.3 (L) 07/17/2022   BILITOT 2.1 (H) 07/17/2022   ALKPHOS 79 07/17/2022   AST 38 07/17/2022   ALT 15 07/17/2022   ANIONGAP 6 07/24/2022    GFR: Estimated Creatinine Clearance: 133.5 mL/min (by C-G formula based on SCr of 0.87 mg/dL).  Recent Results (from the past 240 hour(s))  Resp panel by RT-PCR (RSV, Flu A&B, Covid) Anterior Nasal Swab     Status: Abnormal   Collection Time: 07/17/22 12:50 AM   Specimen: Anterior Nasal Swab  Result Value Ref Range Status   SARS Coronavirus 2 by RT PCR NEGATIVE NEGATIVE Final    Comment: (NOTE) SARS-CoV-2 target nucleic acids are NOT DETECTED.  The SARS-CoV-2 RNA is generally detectable in upper respiratory specimens during the acute phase of infection. The lowest concentration of SARS-CoV-2 viral copies this assay can detect is 138 copies/mL. A negative result does not preclude SARS-Cov-2 infection and should not be used as the sole basis for treatment or other patient management decisions. A negative result may occur with  improper specimen collection/handling, submission of specimen other than nasopharyngeal swab, presence of viral  mutation(s) within the areas targeted by this assay, and inadequate number of viral copies(<138 copies/mL). A negative result must be combined with clinical observations, patient history, and epidemiological information. The expected result is Negative.  Fact Sheet for Patients:  EntrepreneurPulse.com.au  Fact Sheet for Healthcare Providers:  IncredibleEmployment.be  This test is no t yet approved or cleared by the [place] FDA and  has been authorized for detection and/or diagnosis of SARS-CoV-2 by FDA under an Emergency Use Authorization (EUA). This EUA will remain  in effect (meaning this test can be used) for the duration of the COVID-19 declaration under Section 564(b)(1) of the Act, 21 U.S.C.section 360bbb-3(b)(1), unless the authorization is terminated  or revoked sooner.       Influenza A by PCR POSITIVE (A) NEGATIVE Final   Influenza B by PCR NEGATIVE NEGATIVE Final    Comment: (NOTE) The Xpert Xpress SARS-CoV-2/FLU/RSV plus assay is intended as an aid in the diagnosis of influenza from Nasopharyngeal swab specimens and should not be used as a sole basis for treatment. Nasal washings and aspirates are unacceptable for Xpert Xpress SARS-CoV-2/FLU/RSV testing.  Fact Sheet for Patients: EntrepreneurPulse.com.au  Fact Sheet for Healthcare Providers: IncredibleEmployment.be  This test is not yet approved or cleared by the [place] FDA and has been authorized for detection and/or diagnosis of SARS-CoV-2 by FDA under an Emergency Use Authorization (EUA). This EUA will remain in effect (meaning this test can be used) for the duration of the COVID-19 declaration under Section 564(b)(1) of the Act, 21 U.S.C. section 360bbb-3(b)(1), unless the authorization is terminated or revoked.  Resp Syncytial Virus by PCR NEGATIVE NEGATIVE Final    Comment: (NOTE) Fact Sheet for  Patients: EntrepreneurPulse.com.au  Fact Sheet for Healthcare Providers: IncredibleEmployment.be  This test is not yet approved or cleared by the [place] FDA and has been authorized for detection and/or diagnosis of SARS-CoV-2 by FDA under an Emergency Use Authorization (EUA). This EUA will remain in effect (meaning this test can be used) for the duration of the COVID-19 declaration under Section 564(b)(1) of the Act, 21 U.S.C. section 360bbb-3(b)(1), unless the authorization is terminated or revoked.  Performed at [facility], [place] [address], [place], [place] [address]   Blood culture (routine x 2)     Status: None   Collection Time: 07/17/22 12:50 AM   Specimen: BLOOD LEFT FOREARM  Result Value Ref Range Status   Specimen Description BLOOD LEFT FOREARM  Final   Special Requests   Final    BOTTLES DRAWN AEROBIC ONLY Blood Culture results may not be optimal due to an inadequate volume of blood received in culture bottles   Culture   Final    NO GROWTH 5 DAYS Performed at [facility], [place] [address], [place], [place] [address]    Report Status 07/22/2022 FINAL  Final  Blood culture (routine x 2)     Status: None   Collection Time: 07/17/22 12:50 AM   Specimen: BLOOD RIGHT FOREARM  Result Value Ref Range Status   Specimen Description BLOOD RIGHT FOREARM  Final   Special Requests   Final    BOTTLES DRAWN AEROBIC AND ANAEROBIC Blood Culture adequate volume   Culture   Final    NO GROWTH 5 DAYS Performed at [facility], [place] [address] [place], [place] [address]    Report Status 07/22/2022 FINAL  Final  MRSA Next Gen by PCR, Nasal     Status: None   Collection Time: 07/17/22  6:50 PM   Specimen: Nasal Mucosa; Nasal Swab  Result Value Ref Range Status   MRSA by PCR Next Gen NOT DETECTED NOT DETECTED Final    Comment: (NOTE) The GeneXpert MRSA Assay (FDA approved for NASAL specimens only), is one component of a  comprehensive MRSA colonization surveillance program. It is not intended to diagnose MRSA infection nor to guide or monitor treatment for MRSA infections. Test performance is not FDA approved in patients less than 39 years old. Performed at [facility], [place] [address], [place], [place] [address]       Radiology Studies: No results found.    LOS: 9 days    Cordelia Poche, MD [facility] 07/26/2022, 9:17 AM   If 7PM-7AM, please contact night-coverage [email or web address]

## 2022-07-26 NOTE — Progress Notes (Signed)
Mobility Specialist - Progress Note   07/26/22 1100  Mobility  Activity Ambulated with assistance in hallway  Level of Assistance Contact guard assist, steadying assist  Assistive Device Front wheel walker  Distance Ambulated (ft) 200 ft  Activity Response Tolerated well  Mobility Referral Yes  $Mobility charge 1 Mobility    Pt received in bed agreeable to mobility. Tolerated well, stated RLE felt "stiff" during ambulation. Left sitting EOB w/ NT at bedside for bath.   [place] Specialist Please contact via SecureChat or Rehab office at [phone]

## 2022-07-27 DIAGNOSIS — E871 Hypo-osmolality and hyponatremia: Secondary | ICD-10-CM | POA: Diagnosis not present

## 2022-07-27 DIAGNOSIS — J189 Pneumonia, unspecified organism: Secondary | ICD-10-CM | POA: Diagnosis not present

## 2022-07-27 DIAGNOSIS — R2241 Localized swelling, mass and lump, right lower limb: Secondary | ICD-10-CM | POA: Diagnosis not present

## 2022-07-27 DIAGNOSIS — I2782 Chronic pulmonary embolism: Secondary | ICD-10-CM | POA: Diagnosis not present

## 2022-07-27 LAB — BASIC METABOLIC PANEL
Anion gap: 6 (ref 5–15)
BUN: 8 mg/dL (ref 6–20)
CO2: 23 mmol/L (ref 22–32)
Calcium: 9.8 mg/dL (ref 8.9–10.3)
Chloride: 102 mmol/L (ref 98–111)
Creatinine, Ser: 0.7 mg/dL (ref 0.61–1.24)
GFR, Estimated: >60 mL/min (ref >60)
Glucose, Bld: 126 mg/dL — ABNORMAL HIGH (ref 70–99)
Potassium: 3.9 mmol/L (ref 3.5–5.1)
Sodium: 131 mmol/L — ABNORMAL LOW (ref 135–145)

## 2022-07-27 LAB — CBC
HCT: 30.1 % — ABNORMAL LOW (ref 39.0–52.0)
Hemoglobin: 9.7 g/dL — ABNORMAL LOW (ref 13.0–17.0)
MCH: 36.3 pg — ABNORMAL HIGH (ref 26.0–34.0)
MCHC: 32.2 g/dL (ref 30.0–36.0)
MCV: 112.7 fL — ABNORMAL HIGH (ref 80.0–100.0)
Platelets: 299 10*3/uL (ref 150–400)
RBC: 2.67 MIL/uL — ABNORMAL LOW (ref 4.22–5.81)
RDW: 13.6 % (ref 11.5–15.5)
WBC: 14.2 10*3/uL — ABNORMAL HIGH (ref 4.0–10.5)
nRBC: 0 % (ref 0.0–0.2)

## 2022-07-27 LAB — DIFFERENTIAL
Abs Immature Granulocytes: 0.14 10*3/uL — ABNORMAL HIGH (ref 0.00–0.07)
Basophils Absolute: 0.1 10*3/uL (ref 0.0–0.1)
Basophils Relative: 0 %
Eosinophils Absolute: 0.1 10*3/uL (ref 0.0–0.5)
Eosinophils Relative: 1 %
Immature Granulocytes: 1 %
Lymphocytes Relative: 10 %
Lymphs Abs: 1.4 10*3/uL (ref 0.7–4.0)
Monocytes Absolute: 1.2 10*3/uL — ABNORMAL HIGH (ref 0.1–1.0)
Monocytes Relative: 8 %
Neutro Abs: 11.6 10*3/uL — ABNORMAL HIGH (ref 1.7–7.7)
Neutrophils Relative %: 80 %

## 2022-07-27 LAB — PROCALCITONIN: Procalcitonin: 0.22 ng/mL

## 2022-07-27 LAB — MAGNESIUM: Magnesium: 2 mg/dL (ref 1.7–2.4)

## 2022-07-27 LAB — GLUCOSE, CAPILLARY
Glucose-Capillary: 129 mg/dL — ABNORMAL HIGH (ref 70–99)
Glucose-Capillary: 110 mg/dL — ABNORMAL HIGH (ref 70–99)
Glucose-Capillary: 284 mg/dL — ABNORMAL HIGH (ref 70–99)
Glucose-Capillary: 164 mg/dL — ABNORMAL HIGH (ref 70–99)

## 2022-07-27 NOTE — Progress Notes (Signed)
PROGRESS NOTE    [name]  XKG:[id number] DOB: 10/25/1980 DOA: 07/16/2022 PCP: Patient, No Pcp Per   Brief Narrative: [name] is a 42 y.o. male with a history of right leg mass, DVT, PE. Patient presented secondary to shortness of breath and cough and was found to have influenza infection and pneumonia. Patient developed worsening hypoxia complicated by worsening mentation requiring ICU admission and intubation. Patient managed on Tamiflu for Influenza A infection and empiric antibiotics community-acquired pneumonia followed by eventual transition to Unasyn after positive streptococcus pneumoniae urine antigen test. During admission, patient developed diffuse ST elevation concerning for acute pericarditis, managed initially with steroids and transitioned to colchicine.   Assessment and Plan:  Acute respiratory failure with hypoxia Secondary to pneumonia. Patient was admitted by critical care service on 06/2822, admitted to the ICU and eventual intubated day of admission. Patient managed on mechanical ventilation until 07/19/22 when he was successfully extubated. Patient weaned to room air.  Influenza A infection Noted on admission. Patient completed oseltamivir course.  Community acquired pneumonia Secondary to streptococcus pneumoniae. Patient treated empirically with Vancomycin and Zosyn before transitioning to Ceftriaxone/azithromycin and finally to Unasyn to complete a 7-day treatment course. Resolved.  Acute metabolic encephalopathy Secondary to acute illness. Resolved.  Acute pericarditis Likely secondary to viral illness. Transthoracic Echocardiogram with low-normal LVEF of 50-55% and no regional wall motion abnormalities. Patient managed initially with high-dose steroids and transitioned to Colchicine. -Cardiology recommendations; Colchicine x1 month and outpatient follow-up  Fever Newly recurrent fevers. No associated symptoms except for sweats. Associated  leukocytosis is slightly worsened. Chest x-ray unchanged. -Check blood cultures, procalcitonin, CBC with differential  Metastatic  malignancy Unknown primary, but concern for possible sarcoma. Associated numerous pulmonary lesions. No metastatic/primary lesions noted in CT abdomen/pelvis. US guided core biopsy of right calf mass performed on 1/2. Pathology is pending.  Liver cirrhosis No ascites noted on CT imaging.  Hypokalemia Resolved with repletion. Complicated by hypomagnesemia.  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 Otherwise, no other concerns.  Objective: BP 120/69 (BP Location: Left Arm)   Pulse (!) 106   Temp 99.2 F (37.3 C) (Oral)   Resp 20   Ht '6\' 3"'$  (1.905 m)   Wt 88.2 kg   SpO2 97%   BMI 24.30 kg/m   Examination:  General exam: Appears calm and comfortable Respiratory system: Clear to auscultation. Respiratory effort normal. Cardiovascular system: S1 & S2 heard, tachycardia, normal rhythm. No murmurs, rubs, gallops or clicks. Gastrointestinal system: Abdomen is nondistended, soft and nontender. No organomegaly or masses felt. Normal bowel sounds heard. Central nervous system: Alert and oriented. No focal neurological deficits. Musculoskeletal: Right lower leg mass with RLE edema Skin: No cyanosis. No rashes Psychiatry: Judgement and insight appear normal.  Mood & affect appropriate.    Data Reviewed: I have personally reviewed following labs and imaging studies  CBC Lab Results  Component Value Date   WBC 14.2 (H) 07/27/2022   RBC 2.67 (L) 07/27/2022   HGB 9.7 (L) 07/27/2022  HCT 30.1 (L) 07/27/2022   MCV 112.7 (H) 07/27/2022   MCH 36.3 (H) 07/27/2022   PLT 299 07/27/2022   MCHC 32.2 07/27/2022   RDW 13.6 07/27/2022   LYMPHSABS 1.8 07/17/2022   MONOABS 3.2 (H) 07/17/2022   EOSABS 0.0 07/17/2022   BASOSABS 0.0 17/91/5056     Last metabolic panel Lab Results  Component Value Date   NA 131 (L) 07/27/2022   K 3.9 07/27/2022   CL 102 07/27/2022   CO2 23 07/27/2022   BUN 8 07/27/2022   CREATININE 0.70 07/27/2022   GLUCOSE 126 (H) 07/27/2022   GFRNONAA >60 07/27/2022   GFRAA >60 06/11/2015   CALCIUM 9.8 07/27/2022   PHOS 3.1 07/19/2022   PROT 6.9 07/17/2022   ALBUMIN 2.3 (L) 07/17/2022   BILITOT 2.1 (H) 07/17/2022   ALKPHOS 79 07/17/2022   AST 38 07/17/2022   ALT 15 07/17/2022   ANIONGAP 6 07/27/2022    GFR: Estimated Creatinine Clearance: 145.2 mL/min (by C-G formula based on SCr of 0.7 mg/dL).  Recent Results (from the past 240 hour(s))  MRSA Next Gen by PCR, Nasal     Status: None   Collection Time: 07/17/22  6:50 PM   Specimen: Nasal Mucosa; Nasal Swab  Result Value Ref Range Status   MRSA by PCR Next Gen NOT DETECTED NOT DETECTED Final    Comment: (NOTE) The GeneXpert MRSA Assay (FDA approved for NASAL specimens only), is one component of a comprehensive MRSA colonization surveillance program. It is not intended to diagnose MRSA infection nor to guide or monitor treatment for MRSA infections. Test performance is not FDA approved in patients less than 37 years old. Performed at [facility], [place] [address], [place], [place] [address]       Radiology Studies: DG CHEST PORT 1 VIEW  Result Date: 07/26/2022 CLINICAL DATA:  Pneumonia, shortness of breath, pleural effusion EXAM: PORTABLE CHEST 1 VIEW  COMPARISON:  07/17/2022 FINDINGS: Heart size is normal. Numerous pulmonary masses and nodules are again seen throughout the lung. Improved aeration of the right lung, without acute appearing airspace opacity. Interval endotracheal and esophagogastric extubation. Osseous structures unremarkable. IMPRESSION: 1. Numerous pulmonary masses and nodules are again seen throughout the lung. Improved aeration of the right lung, without acute appearing airspace opacity. 2. Interval endotracheal and esophagogastric extubation. Electronically Signed   By: Delanna Ahmadi M.D.   On: 07/26/2022 11:09      LOS: 10 days    Cordelia Poche, MD [facility] 07/27/2022, 10:59 AM   If 7PM-7AM, please contact night-coverage [email or web address]

## 2022-07-28 ENCOUNTER — Other Ambulatory Visit (HOSPITAL_COMMUNITY): Payer: Self-pay

## 2022-07-28 DIAGNOSIS — J189 Pneumonia, unspecified organism: Secondary | ICD-10-CM | POA: Diagnosis not present

## 2022-07-28 DIAGNOSIS — E871 Hypo-osmolality and hyponatremia: Secondary | ICD-10-CM | POA: Diagnosis not present

## 2022-07-28 DIAGNOSIS — R2241 Localized swelling, mass and lump, right lower limb: Secondary | ICD-10-CM | POA: Diagnosis not present

## 2022-07-28 DIAGNOSIS — I2782 Chronic pulmonary embolism: Secondary | ICD-10-CM | POA: Diagnosis not present

## 2022-07-28 LAB — GLUCOSE, CAPILLARY
Glucose-Capillary: 106 mg/dL — ABNORMAL HIGH (ref 70–99)
Glucose-Capillary: 142 mg/dL — ABNORMAL HIGH (ref 70–99)
Glucose-Capillary: 183 mg/dL — ABNORMAL HIGH (ref 70–99)
Glucose-Capillary: 197 mg/dL — ABNORMAL HIGH (ref 70–99)

## 2022-07-28 LAB — CBC
HCT: 30.5 % — ABNORMAL LOW (ref 39.0–52.0)
Hemoglobin: 10.2 g/dL — ABNORMAL LOW (ref 13.0–17.0)
MCH: 36.7 pg — ABNORMAL HIGH (ref 26.0–34.0)
MCHC: 33.4 g/dL (ref 30.0–36.0)
MCV: 109.7 fL — ABNORMAL HIGH (ref 80.0–100.0)
Platelets: 266 10*3/uL (ref 150–400)
RBC: 2.78 MIL/uL — ABNORMAL LOW (ref 4.22–5.81)
RDW: 13.4 % (ref 11.5–15.5)
WBC: 13.7 10*3/uL — ABNORMAL HIGH (ref 4.0–10.5)
nRBC: 0 % (ref 0.0–0.2)

## 2022-07-28 LAB — PROCALCITONIN: Procalcitonin: 0.28 ng/mL

## 2022-07-28 MED ORDER — METOPROLOL TARTRATE 12.5 MG HALF TABLET
12.5000 mg | ORAL_TABLET | Freq: Two times a day (BID) | ORAL | Status: DC
  Administered 2022-07-28 – 2022-07-30 (×5): 12.5 mg via ORAL
  Filled 2022-07-28 (×5): qty 1

## 2022-07-28 NOTE — Progress Notes (Signed)
PROGRESS NOTE    [name]  AYT:[id number] DOB: [birth year] DOA: 07/16/2022 PCP: Patient, No Pcp Per   Brief Narrative: [name] is a 42 y.o. male with a history of right leg mass, DVT, PE. Patient presented secondary to shortness of breath and cough and was found to have influenza infection and pneumonia. Patient developed worsening hypoxia complicated by worsening mentation requiring ICU admission and intubation. Patient managed on Tamiflu for Influenza A infection and empiric antibiotics community-acquired pneumonia followed by eventual transition to Unasyn after positive streptococcus pneumoniae urine antigen test. During admission, patient developed diffuse ST elevation concerning for acute pericarditis, managed initially with steroids and transitioned to colchicine.   Assessment and Plan:  Acute respiratory failure with hypoxia Secondary to pneumonia. Patient was admitted by critical care service on 06/2822, admitted to the ICU and eventual intubated day of admission. Patient managed on mechanical ventilation until 07/19/22 when he was successfully extubated. Patient weaned to room air.  Influenza A infection Noted on admission. Patient completed oseltamivir course.  Community acquired pneumonia Secondary to streptococcus pneumoniae. Patient treated empirically with Vancomycin and Zosyn before transitioning to Ceftriaxone/azithromycin and finally to Unasyn to complete a 7-day treatment course. Resolved.  Acute metabolic encephalopathy Secondary to acute illness. Resolved.  Acute pericarditis Likely secondary to viral illness. Transthoracic Echocardiogram with low-normal LVEF of 50-55% and no regional wall motion abnormalities. Patient managed initially with high-dose steroids and transitioned to Colchicine. -Cardiology recommendations; Colchicine x1 month and outpatient follow-up  Fever Newly recurrent fevers. No associated symptoms except for sweats. Associated  leukocytosis is slightly worsened. Chest x-ray unchanged. Blood cultures no growth to date. Procalcitonin elevated but low. -Follow-up blood culture data  Metastatic  malignancy Unknown primary, but concern for possible sarcoma. Associated numerous pulmonary lesions. No metastatic/primary lesions noted in CT abdomen/pelvis. US guided core biopsy of right calf mass performed on 1/2. Pathology is pending.  Sinus tachycardia Likely secondary to chronic illness. -Metoprolol 12.5 mg BID  Liver cirrhosis No ascites noted on CT imaging.  Hypokalemia Resolved with repletion. Complicated by hypomagnesemia.  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 No other issues overnight or this morning. We discussed transportation barriers for him getting to [facility] He is considering getting a bus or possibly having his brother transport him.  Objective: BP 95/66   Pulse (!) 125   Temp 98.9 F (37.2 C) (Oral)   Resp 18   Ht '6\' 3"'$  (1.905 m)   Wt 87.3 kg   SpO2 95%   BMI 24.06 kg/m   Examination:  General exam: Appears calm and comfortable Respiratory system: Clear to auscultation. Respiratory effort normal. Cardiovascular system: S1 & S2 heard, fast heart rate with regular rhythm. Gastrointestinal system: Abdomen is nondistended, soft and nontender. Normal bowel sounds heard. Central nervous system: Alert  and oriented. Musculoskeletal: Right LE mass. RLE edema Skin: No cyanosis. No rashes Psychiatry: Judgement and insight appear normal. Mood & affect appropriate.    Data Reviewed: I have personally reviewed following labs and imaging studies  CBC  Lab Results  Component Value Date   WBC 13.7 (H) 07/28/2022   RBC 2.78 (L) 07/28/2022   HGB 10.2 (L) 07/28/2022   HCT 30.5 (L) 07/28/2022   MCV 109.7 (H) 07/28/2022   MCH 36.7 (H) 07/28/2022   PLT 266 07/28/2022   MCHC 33.4 07/28/2022   RDW 13.4 07/28/2022   LYMPHSABS 1.4 07/27/2022   MONOABS 1.2 (H) 07/27/2022   EOSABS 0.1 07/27/2022   BASOSABS 0.1 94/85/4627     Last metabolic panel Lab Results  Component Value Date   NA 131 (L) 07/27/2022   K 3.9 07/27/2022   CL 102 07/27/2022   CO2 23 07/27/2022   BUN 8 07/27/2022   CREATININE 0.70 07/27/2022   GLUCOSE 126 (H) 07/27/2022   GFRNONAA >60 07/27/2022   GFRAA >60 06/11/2015   CALCIUM 9.8 07/27/2022   PHOS 3.1 07/19/2022   PROT 6.9 07/17/2022   ALBUMIN 2.3 (L) 07/17/2022   BILITOT 2.1 (H) 07/17/2022   ALKPHOS 79 07/17/2022   AST 38 07/17/2022   ALT 15 07/17/2022   ANIONGAP 6 07/27/2022    GFR: Estimated Creatinine Clearance: 145.2 mL/min (by C-G formula based on SCr of 0.7 mg/dL).  Recent Results (from the past 240 hour(s))  Culture, blood (Routine X 2) w Reflex to ID Panel     Status: None (Preliminary result)   Collection Time: 07/27/22  9:53 AM   Specimen: BLOOD  Result Value Ref Range Status   Specimen Description BLOOD BLOOD RIGHT HAND  Final   Special Requests   Final    BOTTLES DRAWN AEROBIC ONLY Blood Culture adequate volume   Culture   Final    NO GROWTH < 24 HOURS Performed at [facility], [address] [place], [place] [address]    Report Status PENDING  Incomplete  Culture, blood (Routine X 2) w Reflex to ID Panel     Status: None (Preliminary result)   Collection Time: 07/27/22  9:53 AM   Specimen: BLOOD  Result Value Ref Range Status    Specimen Description BLOOD SITE NOT SPECIFIED  Final   Special Requests   Final    BOTTLES DRAWN AEROBIC AND ANAEROBIC Blood Culture adequate volume   Culture   Final    NO GROWTH < 24 HOURS Performed at [facility], [place] [address] [place], [place] [address]    Report Status PENDING  Incomplete      Radiology Studies: No results found.    LOS: 11 days    Cordelia Poche, MD [facility] 07/28/2022, 12:48 PM   If 7PM-7AM, please contact night-coverage [email or web address]

## 2022-07-28 NOTE — Progress Notes (Signed)
Inpatient Rehab Admissions Coordinator:   Pt. Now ambulating at supervision level and does not require the intensity of CIR. CIR will sign off. Pt. Understands and is in agreement. I also called to notify Pt.'s sister.   Clemens Catholic, [place], [place] Admissions Coordinator  [phone] ([place]) [phone] (office)

## 2022-07-28 NOTE — TOC Benefit Eligibility Note (Signed)
Patient Teacher, English as a foreign language completed.    The patient is currently admitted and upon discharge could be taking enoxaparin (Lovenox) 100 mg/ml.  The current 30 day co-pay is $35.00.   The patient is currently admitted and upon discharge could be taking Eliquis 5 mg.  The current 30 day co-pay is $60.00.   The patient is currently admitted and upon discharge could be taking Xarelto 20 mg.  The current 30 day co-pay is $60.00.   The patient is insured through [employer] [place], [place] Patient [place] Patient Advocate Team Direct Number: [phone]  Fax: [phone]

## 2022-07-28 NOTE — Progress Notes (Signed)
Physical Therapy Treatment Patient Details Name: [name] MRN: [id number] DOB: February 26, 1981 Today's Date: 07/28/2022   History of Present Illness Pt is a 42 y.o. male admitted 07/17/22 with SOB, cough; workup for influenza PNA. Pt with worsening respiratory status and AMS; transfer to ICU. Chest CTA with chronic PE, innumerable pulmonary masses bilaterally; suspect widespread pulmonary metastatic disease. Acute pericarditis 12/29. ETT 12/28-12/30. S/p R calf mass biopsy 1/2. PMH includes RLE mass and DVT, tooth infection.    PT Comments    Pt demonstrating improved activity tolerance and increased ambulation distance. Pt ambulating 600 ft with a RW without physical assist. HR 115-140 bpm, SpO2 97% on RA, DOE 2/4. Pt pleased with progress. Will continue to follow acutely.    Recommendations for follow up therapy are one component of a multi-disciplinary discharge planning process, led by the attending physician.  Recommendations may be updated based on patient status, additional functional criteria and insurance authorization.  Follow Up Recommendations  Outpatient PT     Assistance Recommended at Discharge Intermittent Supervision/Assistance  Patient can return home with the following A little help with bathing/dressing/bathroom;Assistance with cooking/housework;Assist for transportation;Direct supervision/assist for medications management;Direct supervision/assist for financial management   Equipment Recommendations  Rolling walker (2 wheels)    Recommendations for Other Services       Precautions / Restrictions Precautions Precautions: Other (comment) Precaution Comments: watch HR; known RLE swelling (awaiting biopsy results) Restrictions Weight Bearing Restrictions: No     Mobility  Bed Mobility Overal bed mobility: Modified Independent                  Transfers Overall transfer level: Modified independent Equipment used: Rolling walker (2 wheels)                     Ambulation/Gait Ambulation/Gait assistance: Supervision Gait Distance (Feet): 600 Feet Assistive device: Rolling walker (2 wheels), None Gait Pattern/deviations: Step-through pattern, Trunk flexed, Decreased stride length Gait velocity: decreased     General Gait Details: Cues for posture and proximity to RW. Pt ambulating short distance without RW with good balance   Stairs             Wheelchair Mobility    Modified Rankin (Stroke Patients Only)       Balance Overall balance assessment: Needs assistance Sitting-balance support: Feet supported, No upper extremity supported Sitting balance-Leahy Scale: Normal     Standing balance support: No upper extremity supported, During functional activity Standing balance-Leahy Scale: Good                              Cognition Arousal/Alertness: Awake/alert Behavior During Therapy: WFL for tasks assessed/performed Overall Cognitive Status: Within Functional Limits for tasks assessed                                          Exercises      General Comments        Pertinent Vitals/Pain Pain Assessment Pain Assessment: No/denies pain    Home Living                          Prior Function            PT Goals (current goals can now be found in the care plan section) Acute Rehab PT Goals Potential  to Achieve Goals: Good Progress towards PT goals: Progressing toward goals    Frequency    Min 2X/week      PT Plan Frequency needs to be updated    Co-evaluation              AM-PAC PT "6 Clicks" Mobility   Outcome Measure  Help needed turning from your back to your side while in a flat bed without using bedrails?: None Help needed moving from lying on your back to sitting on the side of a flat bed without using bedrails?: None Help needed moving to and from a bed to a chair (including a wheelchair)?: None Help needed standing up from a chair  using your arms (e.g., wheelchair or bedside chair)?: None Help needed to walk in hospital room?: A Little Help needed climbing 3-5 steps with a railing? : A Little 6 Click Score: 22    End of Session   Activity Tolerance: Patient tolerated treatment well Patient left: in bed;with call bell/phone within reach Nurse Communication: Mobility status PT Visit Diagnosis: Other abnormalities of gait and mobility (R26.89);Muscle weakness (generalized) (M62.81);Unsteadiness on feet (R26.81);Difficulty in walking, not elsewhere classified (R26.2)     Time: 2111-7356 PT Time Calculation (min) (ACUTE ONLY): 17 min  Charges:  $Therapeutic Activity: 8-22 mins                     Wyona Almas, PT, DPT Acute Rehabilitation Services Office [phone]    [name] 07/28/2022, 10:49 AM

## 2022-07-29 ENCOUNTER — Inpatient Hospital Stay (HOSPITAL_COMMUNITY): Payer: BC Managed Care – PPO

## 2022-07-29 DIAGNOSIS — R2241 Localized swelling, mass and lump, right lower limb: Secondary | ICD-10-CM | POA: Diagnosis not present

## 2022-07-29 DIAGNOSIS — E871 Hypo-osmolality and hyponatremia: Secondary | ICD-10-CM | POA: Diagnosis not present

## 2022-07-29 DIAGNOSIS — J189 Pneumonia, unspecified organism: Secondary | ICD-10-CM | POA: Diagnosis not present

## 2022-07-29 DIAGNOSIS — I2782 Chronic pulmonary embolism: Secondary | ICD-10-CM | POA: Diagnosis not present

## 2022-07-29 LAB — GLUCOSE, CAPILLARY
Glucose-Capillary: 100 mg/dL — ABNORMAL HIGH (ref 70–99)
Glucose-Capillary: 142 mg/dL — ABNORMAL HIGH (ref 70–99)
Glucose-Capillary: 117 mg/dL — ABNORMAL HIGH (ref 70–99)
Glucose-Capillary: 104 mg/dL — ABNORMAL HIGH (ref 70–99)

## 2022-07-29 NOTE — Progress Notes (Signed)
PROGRESS NOTE    Gurshan Settlemire  WIO:[id number] DOB: 1981/06/21 DOA: 07/16/2022 PCP: Patient, No Pcp Per   Brief Narrative: Carless Slatten is a 42 y.o. male with a history of right leg mass, DVT, PE. Patient presented secondary to shortness of breath and cough and was found to have influenza infection and pneumonia. Patient developed worsening hypoxia complicated by worsening mentation requiring ICU admission and intubation. Patient managed on Tamiflu for Influenza A infection and empiric antibiotics community-acquired pneumonia followed by eventual transition to Unasyn after positive streptococcus pneumoniae urine antigen test. During admission, patient developed diffuse ST elevation concerning for acute pericarditis, managed initially with steroids and transitioned to colchicine.   Assessment and Plan:  Acute respiratory failure with hypoxia Secondary to pneumonia. Patient was admitted by critical care service on 06/2822, admitted to the ICU and eventual intubated day of admission. Patient managed on mechanical ventilation until 07/19/22 when he was successfully extubated. Patient weaned to room air.  Influenza A infection Noted on admission. Patient completed oseltamivir course.  Community acquired pneumonia Secondary to streptococcus pneumoniae. Patient treated empirically with Vancomycin and Zosyn before transitioning to Ceftriaxone/azithromycin and finally to Unasyn to complete a 7-day treatment course.   Fever Newly recurrent fevers. No associated symptoms except for sweats. Associated leukocytosis is slightly worsened. Chest x-ray unchanged. Blood cultures no growth to date. Procalcitonin elevated but low. Patient now with new sputum production and worsened lung sounds concerning for pneumonia. No episodes of aspiration per patient. Chest x-ray obtained remains stable without new infiltrates. Procalcitonin 0.22 > 0.28 -Sputum culture -Since afebrile and stable leukocytosis, will  hold off on antibiotics -Follow-up blood culture data  Acute metabolic encephalopathy Secondary to acute illness. Resolved.  Acute pericarditis Likely secondary to viral illness. Transthoracic Echocardiogram with low-normal LVEF of 50-55% and no regional wall motion abnormalities. Patient managed initially with high-dose steroids and transitioned to Colchicine. -Cardiology recommendations; Colchicine x1 month and outpatient follow-up  Metastatic  malignancy Unknown primary, but concern for possible sarcoma. Associated numerous pulmonary lesions. No metastatic/primary lesions noted in CT abdomen/pelvis. US guided core biopsy of right calf mass performed on 1/2. Pathology is pending (called pathology for update on 1/8, still pending).  Sinus tachycardia Likely secondary to chronic illness. -Metoprolol 12.5 mg BID  Liver cirrhosis No ascites noted on CT imaging.  Hypokalemia Resolved with repletion. Complicated by hypomagnesemia.  Hypomagnesemia Resolved.  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 Overall though, he states he feels better.  Objective: BP 114/72 (BP Location: Left Arm)   Pulse 100   Temp 99.2 F (37.3 C) (Oral)   Resp 20   Ht '6\' 3"'$  (1.905 m)   Wt 88 kg   SpO2 98%   BMI 24.26 kg/m   Examination:  General exam: Appears calm and  comfortable Respiratory system: Coarse breath sounds mainly in right lung fields. Rales on left base. Respiratory effort normal. Cardiovascular system: S1 & S2 heard, tachycardia, normal rhythm. Gastrointestinal system: Abdomen is nondistended, soft and nontender. No organomegaly  or masses felt. Normal bowel sounds heard. Central nervous system: Alert and oriented. No focal neurological deficits. Musculoskeletal: RLE mass noted with associated edema.  Skin: No cyanosis. No rashes Psychiatry: Judgement and insight appear normal. Mood & affect appropriate.   Data Reviewed: I have personally reviewed following labs and imaging studies  CBC Lab Results  Component Value Date   WBC 13.7 (H) 07/28/2022   RBC 2.78 (L) 07/28/2022   HGB 10.2 (L) 07/28/2022   HCT 30.5 (L) 07/28/2022   MCV 109.7 (H) 07/28/2022   MCH 36.7 (H) 07/28/2022   PLT 266 07/28/2022   MCHC 33.4 07/28/2022   RDW 13.4 07/28/2022   LYMPHSABS 1.4 07/27/2022   MONOABS 1.2 (H) 07/27/2022   EOSABS 0.1 07/27/2022   BASOSABS 0.1 66/29/4765     Last metabolic panel Lab Results  Component Value Date   NA 131 (L) 07/27/2022   K 3.9 07/27/2022   CL 102 07/27/2022   CO2 23 07/27/2022   BUN 8 07/27/2022   CREATININE 0.70 07/27/2022   GLUCOSE 126 (H) 07/27/2022   GFRNONAA >60 07/27/2022   GFRAA >60 06/11/2015   CALCIUM 9.8 07/27/2022   PHOS 3.1 07/19/2022   PROT 6.9 07/17/2022   ALBUMIN 2.3 (L) 07/17/2022   BILITOT 2.1 (H) 07/17/2022   ALKPHOS 79 07/17/2022   AST 38 07/17/2022   ALT 15 07/17/2022   ANIONGAP 6 07/27/2022    GFR: Estimated Creatinine Clearance: 145.2 mL/min (by C-G formula based on SCr of 0.7 mg/dL).  Recent Results (from the past 240 hour(s))  Culture, blood (Routine X 2) w Reflex to ID Panel     Status: None (Preliminary result)   Collection Time: 07/27/22  9:53 AM   Specimen: BLOOD  Result Value Ref Range Status   Specimen Description BLOOD BLOOD RIGHT HAND  Final   Special Requests   Final     BOTTLES DRAWN AEROBIC ONLY Blood Culture adequate volume   Culture   Final    NO GROWTH 2 DAYS Performed at [facility], [address] [place], [place] [address]    Report Status PENDING  Incomplete  Culture, blood (Routine X 2) w Reflex to ID Panel     Status: None (Preliminary result)   Collection Time: 07/27/22  9:53 AM   Specimen: BLOOD  Result Value Ref Range Status   Specimen Description BLOOD SITE NOT SPECIFIED  Final   Special Requests   Final    BOTTLES DRAWN AEROBIC AND ANAEROBIC Blood Culture adequate volume   Culture   Final    NO GROWTH 2 DAYS Performed at [facility], [place] [address], [place], [place] [address]    Report Status PENDING  Incomplete      Radiology Studies: No results found.    LOS: 12 days    Cordelia Poche, MD [facility] 07/29/2022, 9:35 AM   If 7PM-7AM, please contact night-coverage [email or web address]

## 2022-07-29 NOTE — Progress Notes (Signed)
Nutrition Follow-up  DOCUMENTATION CODES:   Non-severe (moderate) malnutrition in context of chronic illness  INTERVENTION:   Continue Multivitamin w/ minerals daily Encourage good PO intake   NUTRITION DIAGNOSIS:   Moderate Malnutrition (in the context of chronic illness) related to poor appetite as evidenced by mild fat depletion, mild muscle depletion. - Ongoing   GOAL:   Patient will meet greater than or equal to 90% of their needs - Ongoing  MONITOR:   PO intake, Skin, Labs, I & O's  REASON FOR ASSESSMENT:   Consult Enteral/tube feeding initiation and management  ASSESSMENT:   Pt with hx of leg mass (likely malignant but pt did not follow-up after dx), DVT, and alcohol abuse presented to ED with several days of SOB and cough. Febrile and hypoxic in ED. Imaging showed cirrhosis, pneumonia and innumerable pulmonary masses consistent with widespread metastatic disease. Pt also found to be Flu+.  12/28 - Intubated 12/30 - Extubated  01/02 - s/p biopsy of R calf mass; diet advanced to regular  RD working remotely at time of follow-up. Pathology still pending.  PT no longer recommending CIR at discharge. Pt now with increase sputum and worsening lung sounds, concerning for pneumonia.   Meal Intake 01/05-01/08: 100% x 7 meals   Medications reviewed and include: Pepcid, Folic Acid, NovoLog SSI, MVI, Thiamine Labs reviewed: 24 hr CBGs 100-197  Diet Order:   Diet Order             Diet regular Room service appropriate? Yes; Fluid consistency: Thin  Diet effective now                   EDUCATION NEEDS:   No education needs have been identified at this time  Skin:  Skin Assessment: Reviewed RN Assessment  Last BM:  1/7  Height:   Ht Readings from Last 1 Encounters:  07/17/22 '6\' 3"'$  (1.905 m)    Weight:   Wt Readings from Last 1 Encounters:  07/29/22 88 kg    Ideal Body Weight:  89.1 kg  BMI:  Body mass index is 24.26 kg/m.  Estimated  Nutritional Needs:   Kcal:  2500-2700 kcal/d  Protein:  125-140g/d  Fluid:  2.5-2.7L/d    Hermina Barters RD, LDN Clinical Dietitian See Shea Evans for contact information.

## 2022-07-30 DIAGNOSIS — R0902 Hypoxemia: Secondary | ICD-10-CM

## 2022-07-30 DIAGNOSIS — J189 Pneumonia, unspecified organism: Secondary | ICD-10-CM | POA: Diagnosis not present

## 2022-07-30 DIAGNOSIS — R2241 Localized swelling, mass and lump, right lower limb: Secondary | ICD-10-CM | POA: Diagnosis not present

## 2022-07-30 LAB — BASIC METABOLIC PANEL
Anion gap: 5 (ref 5–15)
BUN: 8 mg/dL (ref 6–20)
CO2: 24 mmol/L (ref 22–32)
Calcium: 10.5 mg/dL — ABNORMAL HIGH (ref 8.9–10.3)
Chloride: 102 mmol/L (ref 98–111)
Creatinine, Ser: 0.65 mg/dL (ref 0.61–1.24)
GFR, Estimated: >60 mL/min (ref >60)
Glucose, Bld: 117 mg/dL — ABNORMAL HIGH (ref 70–99)
Potassium: 3.9 mmol/L (ref 3.5–5.1)
Sodium: 131 mmol/L — ABNORMAL LOW (ref 135–145)

## 2022-07-30 LAB — CBC
HCT: 30.1 % — ABNORMAL LOW (ref 39.0–52.0)
Hemoglobin: 9.6 g/dL — ABNORMAL LOW (ref 13.0–17.0)
MCH: 35.4 pg — ABNORMAL HIGH (ref 26.0–34.0)
MCHC: 31.9 g/dL (ref 30.0–36.0)
MCV: 111.1 fL — ABNORMAL HIGH (ref 80.0–100.0)
Platelets: 252 10*3/uL (ref 150–400)
RBC: 2.71 MIL/uL — ABNORMAL LOW (ref 4.22–5.81)
RDW: 13.2 % (ref 11.5–15.5)
WBC: 9.8 10*3/uL (ref 4.0–10.5)
nRBC: 0 % (ref 0.0–0.2)

## 2022-07-30 LAB — GLUCOSE, CAPILLARY
Glucose-Capillary: 96 mg/dL (ref 70–99)
Glucose-Capillary: 121 mg/dL — ABNORMAL HIGH (ref 70–99)
Glucose-Capillary: 112 mg/dL — ABNORMAL HIGH (ref 70–99)
Glucose-Capillary: 116 mg/dL — ABNORMAL HIGH (ref 70–99)

## 2022-07-30 MED ORDER — METOPROLOL TARTRATE 25 MG PO TABS
25.0000 mg | ORAL_TABLET | Freq: Two times a day (BID) | ORAL | Status: DC
  Administered 2022-07-30 – 2022-07-31 (×2): 25 mg via ORAL
  Filled 2022-07-30 (×2): qty 1

## 2022-07-30 MED ORDER — METOPROLOL TARTRATE 12.5 MG HALF TABLET
12.5000 mg | ORAL_TABLET | Freq: Once | ORAL | Status: AC
  Administered 2022-07-30: 12.5 mg via ORAL
  Filled 2022-07-30: qty 1

## 2022-07-30 MED ORDER — ENOXAPARIN SODIUM 80 MG/0.8ML IJ SOSY
80.0000 mg | PREFILLED_SYRINGE | Freq: Two times a day (BID) | INTRAMUSCULAR | Status: DC
  Administered 2022-07-30 – 2022-08-08 (×18): 80 mg via SUBCUTANEOUS
  Filled 2022-07-30 (×18): qty 0.8

## 2022-07-30 NOTE — Progress Notes (Signed)
Mobility Specialist Progress Note:   07/30/22 1114  Mobility  Activity Ambulated with assistance in hallway  Level of Assistance Modified independent, requires aide device or extra time  Assistive Device Front wheel walker  Distance Ambulated (ft) 600 ft  Activity Response Tolerated well  $Mobility charge 1 Mobility   Pt in bed willing to participate in mobility. No complaints of pain. Left EOB with call bell in reach and all needs met.   Gareth Eagle Anie Juniel Mobility Specialist Please contact via [employer] or  Rehab Office at [phone]

## 2022-07-30 NOTE — Progress Notes (Signed)
Progress Note   Patient: [name] XFG:[id number] DOB: 22-Jul-1980 DOA: 07/16/2022     13 DOS: the patient was seen and examined on 07/30/2022   Brief hospital course: [name] is a 42 y.o. male with a history of right leg mass, DVT, PE. Patient presented secondary to shortness of breath and cough and was found to have influenza infection and pneumonia. Patient developed worsening hypoxia complicated by worsening mentation requiring ICU admission and intubation. Patient managed on Tamiflu for Influenza A infection and empiric antibiotics community-acquired pneumonia followed by eventual transition to Unasyn after positive streptococcus pneumoniae urine antigen test. During admission, patient developed diffuse ST elevation concerning for acute pericarditis, managed initially with steroids and transitioned to colchicine.  Assessment and Plan: Acute respiratory failure with hypoxia Secondary to pneumonia. Patient was admitted by critical care service on 06/2822, admitted to the ICU and eventual intubated day of admission. Patient managed on mechanical ventilation until 07/19/22 when he was successfully extubated. Patient weaned to room air. -Remains stable on RA   Influenza A infection Noted on admission. Patient completed oseltamivir course.   Community acquired pneumonia Secondary to streptococcus pneumoniae.  -completed course of abx   Fever recent recurrent fevers. No associated symptoms except for sweats. Associated leukocytosis is slightly worsened. Chest x-ray unchanged. Blood cultures no growth to date. Procalcitonin elevated but low. Patient now with new sputum production and worsened lung sounds concerning for pneumonia. No episodes of aspiration per patient. Chest x-ray obtained remains stable without new infiltrates. Procalcitonin 0.22 > 0.28 -holding off abx, no identifiable infectious source at this time  Acute metabolic encephalopathy Secondary to acute illness.  Resolved.   Acute pericarditis Likely secondary to viral illness. Transthoracic Echocardiogram with low-normal LVEF of 50-55% and no regional wall motion abnormalities. Patient managed initially with high-dose steroids and transitioned to Colchicine. 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 Associated numerous pulmonary lesions. No metastatic/primary lesions noted in CT abdomen/pelvis. US guided core biopsy of right calf mass performed on 1/2.  -Path results remain pending  Sinus tachycardia Likely secondary to chronic illness as well as hx PE -Remains tachycardic this AM, increased metoprolol to '25mg'$  . Cont to titrate as tolerated   Liver cirrhosis No ascites noted on CT imaging.   Hypokalemia Resolved with repletion. Complicated by hypomagnesemia.   Hypomagnesemia Resolved.   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 Feeling better tdoay  Physical Exam: Vitals:   07/30/22 0804 07/30/22 0825 07/30/22 1143 07/30/22 1622  BP: 124/85     Pulse: (!) 121  (!) 108 (!) 108  Resp:      Temp: 98.1 F (36.7 C)  98.1 F (36.7 C) 100.1 F (37.8 C)  TempSrc: Oral  Oral Oral  SpO2: 97% 98% 98% 98%  Weight:      Height:       General exam: Awake, laying in bed, in nad Respiratory system: Normal respiratory effort, no wheezing Cardiovascular system: regular rate, s1, s2 Gastrointestinal system: Soft, nondistended, positive BS Central nervous system: CN2-12 grossly intact, strength intact Extremities: Perfused, no clubbing Skin: Normal skin turgor, no notable skin lesions seen Psychiatry: Mood normal // no visual hallucinations   Data Reviewed:  Labs  reviewed: Na 131, K 3.9, Cr 0.65, Hgb  9.6   Family Communication: Pt in room, family not at bedside  Disposition: Status is: Inpatient Remains inpatient appropriate because: Severity of illness  Planned Discharge Destination: Home    Author: Marylu Lund, MD 07/30/2022 4:30 PM  For on call review [email or web address].

## 2022-07-31 ENCOUNTER — Inpatient Hospital Stay (HOSPITAL_COMMUNITY): Payer: BC Managed Care – PPO

## 2022-07-31 DIAGNOSIS — R0902 Hypoxemia: Secondary | ICD-10-CM | POA: Diagnosis not present

## 2022-07-31 DIAGNOSIS — R2241 Localized swelling, mass and lump, right lower limb: Secondary | ICD-10-CM | POA: Diagnosis not present

## 2022-07-31 DIAGNOSIS — J189 Pneumonia, unspecified organism: Secondary | ICD-10-CM | POA: Diagnosis not present

## 2022-07-31 LAB — CBC
HCT: 30.6 % — ABNORMAL LOW (ref 39.0–52.0)
Hemoglobin: 10.1 g/dL — ABNORMAL LOW (ref 13.0–17.0)
MCH: 36.2 pg — ABNORMAL HIGH (ref 26.0–34.0)
MCHC: 33 g/dL (ref 30.0–36.0)
MCV: 109.7 fL — ABNORMAL HIGH (ref 80.0–100.0)
Platelets: 247 10*3/uL (ref 150–400)
RBC: 2.79 MIL/uL — ABNORMAL LOW (ref 4.22–5.81)
RDW: 13.3 % (ref 11.5–15.5)
WBC: 10.9 10*3/uL — ABNORMAL HIGH (ref 4.0–10.5)
nRBC: 0 % (ref 0.0–0.2)

## 2022-07-31 LAB — GLUCOSE, CAPILLARY
Glucose-Capillary: 113 mg/dL — ABNORMAL HIGH (ref 70–99)
Glucose-Capillary: 125 mg/dL — ABNORMAL HIGH (ref 70–99)
Glucose-Capillary: 125 mg/dL — ABNORMAL HIGH (ref 70–99)
Glucose-Capillary: 128 mg/dL — ABNORMAL HIGH (ref 70–99)

## 2022-07-31 LAB — COMPREHENSIVE METABOLIC PANEL
ALT: 21 U/L (ref 0–44)
AST: 29 U/L (ref 15–41)
Albumin: 2.8 g/dL — ABNORMAL LOW (ref 3.5–5.0)
Alkaline Phosphatase: 106 U/L (ref 38–126)
Anion gap: 7 (ref 5–15)
BUN: 8 mg/dL (ref 6–20)
CO2: 22 mmol/L (ref 22–32)
Calcium: 10.7 mg/dL — ABNORMAL HIGH (ref 8.9–10.3)
Chloride: 101 mmol/L (ref 98–111)
Creatinine, Ser: 0.62 mg/dL (ref 0.61–1.24)
GFR, Estimated: >60 mL/min (ref >60)
Glucose, Bld: 116 mg/dL — ABNORMAL HIGH (ref 70–99)
Potassium: 3.9 mmol/L (ref 3.5–5.1)
Sodium: 130 mmol/L — ABNORMAL LOW (ref 135–145)
Total Bilirubin: 1.1 mg/dL (ref 0.3–1.2)
Total Protein: 7.9 g/dL (ref 6.5–8.1)

## 2022-07-31 LAB — URINALYSIS, ROUTINE W REFLEX MICROSCOPIC
Bilirubin Urine: NEGATIVE
Glucose, UA: NEGATIVE mg/dL
Hgb urine dipstick: NEGATIVE
Ketones, ur: NEGATIVE mg/dL
Leukocytes,Ua: NEGATIVE
Nitrite: NEGATIVE
Protein, ur: NEGATIVE mg/dL
Specific Gravity, Urine: 1.011 (ref 1.005–1.030)
pH: 5 (ref 5.0–8.0)

## 2022-07-31 LAB — PROCALCITONIN: Procalcitonin: 0.22 ng/mL

## 2022-07-31 MED ORDER — METOPROLOL TARTRATE 12.5 MG HALF TABLET
12.5000 mg | ORAL_TABLET | Freq: Once | ORAL | Status: AC
  Administered 2022-07-31: 12.5 mg via ORAL
  Filled 2022-07-31: qty 1

## 2022-07-31 MED ORDER — METOPROLOL TARTRATE 25 MG PO TABS
37.5000 mg | ORAL_TABLET | Freq: Two times a day (BID) | ORAL | Status: DC
  Administered 2022-07-31 – 2022-08-08 (×16): 37.5 mg via ORAL
  Filled 2022-07-31 (×16): qty 1

## 2022-07-31 MED ORDER — LACTATED RINGERS IV SOLN: INTRAVENOUS | Status: DC

## 2022-07-31 NOTE — Progress Notes (Signed)
Physical Therapy Treatment Patient Details Name: [name] MRN: [id number] DOB: Sep 26, 1980 Today's Date: 07/31/2022   History of Present Illness Pt is a 42 y.o. male admitted 07/17/22 with SOB, cough; workup for influenza PNA. Pt with worsening respiratory status and AMS; transfer to ICU. Chest CTA with chronic PE, innumerable pulmonary masses bilaterally; suspect widespread pulmonary metastatic disease. Acute pericarditis 12/29. ETT 12/28-12/30. S/p R calf mass biopsy 1/2. PMH includes RLE mass and DVT, tooth infection.    PT Comments    Pt is making good progress with mobility, ambulating up to ~475 ft without an AD/UE support today. He does display some mild instability when his dynamic gait balance is challenged though, needing minA to recover LOB when cued to turn his head L <> R. Pt with urgent need to have bout of emesis when finishing gait bout, reportedly due to activity after just eating. Noted HR to increase up to 130s with activity this session. Will continue to follow acutely. Pending pt's progress, pt may not need follow-up PT, but will continue to recommend OPPT for now.     Recommendations for follow up therapy are one component of a multi-disciplinary discharge planning process, led by the attending physician.  Recommendations may be updated based on patient status, additional functional criteria and insurance authorization.  Follow Up Recommendations  Outpatient PT (vs no PT pending progress)     Assistance Recommended at Discharge Intermittent Supervision/Assistance  Patient can return home with the following Assistance with cooking/housework;Assist for transportation;Direct supervision/assist for medications management;Direct supervision/assist for financial management;Help with stairs or ramp for entrance   Equipment Recommendations  None recommended by PT    Recommendations for Other Services       Precautions / Restrictions Precautions Precautions: Other  (comment) Precaution Comments: watch HR; known RLE swelling (awaiting biopsy results) Restrictions Weight Bearing Restrictions: No     Mobility  Bed Mobility Overal bed mobility: Modified Independent             General bed mobility comments: No assist needed.    Transfers Overall transfer level: Modified independent Equipment used: None               General transfer comment: Able to come to stand without assistance or LOB    Ambulation/Gait Ambulation/Gait assistance: Min assist, Min guard Gait Distance (Feet): 475 Feet Assistive device: None Gait Pattern/deviations: Step-through pattern, Decreased stride length, Narrow base of support, Drifts right/left Gait velocity: decreased Gait velocity interpretation: 1.31 - 2.62 ft/sec, indicative of limited community ambulator   General Gait Details: Pt ambulating without UE support, displaying intermittent narrow placement of R foot. Cues provided to widen stance. Pt with tendency to drift laterally mildly and displayed trunk sway and x1 LOB when cued to turn his head L <> R, minA to recover. Otherwise, min guard for safety only. x1 standing rest break at half way mark. Pt with urgency to have bout of emesis upon end of gait bout.   Stairs             Wheelchair Mobility    Modified Rankin (Stroke Patients Only)       Balance Overall balance assessment: Needs assistance Sitting-balance support: Feet supported, No upper extremity supported Sitting balance-Leahy Scale: Normal     Standing balance support: No upper extremity supported, During functional activity Standing balance-Leahy Scale: Good Standing balance comment: Able to ambulate without UE support but did have x1 LOB when challenging vestibular system, minA to recover  Cognition Arousal/Alertness: Awake/alert Behavior During Therapy: WFL for tasks assessed/performed Overall Cognitive Status: Within  Functional Limits for tasks assessed                                          Exercises      General Comments General comments (skin integrity, edema, etc.): HR up to 130s      Pertinent Vitals/Pain Pain Assessment Pain Assessment: Faces Faces Pain Scale: Hurts a little bit Pain Location: R leg Pain Descriptors / Indicators: Discomfort Pain Intervention(s): Limited activity within patient's tolerance, Monitored during session    Home Living                          Prior Function            PT Goals (current goals can now be found in the care plan section) Acute Rehab PT Goals Patient Stated Goal: to improve PT Goal Formulation: With patient Time For Goal Achievement: 08/02/22 Potential to Achieve Goals: Good Progress towards PT goals: Progressing toward goals    Frequency    Min 2X/week      PT Plan Equipment recommendations need to be updated    Co-evaluation              AM-PAC PT "6 Clicks" Mobility   Outcome Measure  Help needed turning from your back to your side while in a flat bed without using bedrails?: None Help needed moving from lying on your back to sitting on the side of a flat bed without using bedrails?: None Help needed moving to and from a bed to a chair (including a wheelchair)?: None Help needed standing up from a chair using your arms (e.g., wheelchair or bedside chair)?: None Help needed to walk in hospital room?: A Little Help needed climbing 3-5 steps with a railing? : A Little 6 Click Score: 22    End of Session   Activity Tolerance: Patient tolerated treatment well Patient left: in bed;with call bell/phone within reach Nurse Communication: Mobility status;Other (comment) (bout of emesis) PT Visit Diagnosis: Other abnormalities of gait and mobility (R26.89);Muscle weakness (generalized) (M62.81);Unsteadiness on feet (R26.81);Difficulty in walking, not elsewhere classified (R26.2)     Time:  1102-1117 PT Time Calculation (min) (ACUTE ONLY): 20 min  Charges:  $Gait Training: 8-22 mins                     Moishe Spice, PT, DPT Acute Rehabilitation Services  Office: [phone]    Orvan Falconer 07/31/2022, 1:38 PM

## 2022-07-31 NOTE — Progress Notes (Signed)
Progress Note   Patient: [name] QMG:[id number] DOB: [birth year] DOA: 07/16/2022     14 DOS: the patient was seen and examined on 07/31/2022   Brief hospital course: [name] is a 42 y.o. male with a history of right leg mass, DVT, PE. Patient presented secondary to shortness of breath and cough and was found to have influenza infection and pneumonia. Patient developed worsening hypoxia complicated by worsening mentation requiring ICU admission and intubation. Patient managed on Tamiflu for Influenza A infection and empiric antibiotics community-acquired pneumonia followed by eventual transition to Unasyn after positive streptococcus pneumoniae urine antigen test. During admission, patient developed diffuse ST elevation concerning for acute pericarditis, managed initially with steroids and transitioned to colchicine.  Assessment and Plan: Acute respiratory failure with hypoxia Secondary to pneumonia. Patient was admitted by critical care service on 06/2822, admitted to the ICU and eventual intubated day of admission. Patient managed on mechanical ventilation until 07/19/22 when he was successfully extubated. Patient weaned to room air. -Remains on RA this AM   Influenza A infection Noted on admission. Patient completed oseltamivir course.   Community acquired pneumonia Secondary to streptococcus pneumoniae.  -completed course of abx   Fever recent recurrent fevers this afternoon -Associated with multiple bouts of nausea/vomiting since this AM -Will check procal -Recheck blood cx, UA, CXR, abd xray  Acute metabolic encephalopathy Secondary to acute illness. Resolved.   Acute pericarditis Likely secondary to viral illness. Transthoracic Echocardiogram with low-normal LVEF of 50-55% and no regional wall motion abnormalities. Patient managed initially with high-dose steroids and transitioned to Colchicine. 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 Associated numerous pulmonary lesions. No metastatic/primary lesions noted in CT abdomen/pelvis. US guided core biopsy of right calf mass performed on 1/2.  -Path results remain pending today  Sinus tachycardia Likely secondary to chronic illness as well as hx PE -Remains tachycardic this AM, increased metoprolol to 37.'5mg'$  .  -Cont to titrate as tolerated   Liver cirrhosis No ascites noted on CT imaging.   Hypokalemia Resolved with repletion. Complicated by hypomagnesemia.   Hypomagnesemia Resolved.   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 -Continue Lovenox -Per benefits check: Lovenox copay is $35.00, Eliquis copay is $60.00, Xarelto copay is $60.00      Subjective: Complains of multiple bouts of N/V this AM  Physical Exam: Vitals:   07/31/22 0856 07/31/22 1125 07/31/22 1450 07/31/22 1600  BP:  122/72 115/68 104/72  Pulse:  (!) 112 (!) 119 (!) 108  Resp:  (!) 22 (!) 27   Temp:  99.4 F (37.4 C) 98.9 F (37.2 C) (!) 100.9 F (38.3 C)  TempSrc:  Oral Oral Oral  SpO2: 99% 96% 97% 97%  Weight:      Height:       General exam: Conversant, in no acute distress Respiratory system: normal chest rise, clear, no audible wheezing Cardiovascular system: regular rhythm, s1-s2 Gastrointestinal system: Nondistended, nontender, pos BS Central nervous system: No seizures, no tremors Extremities: No cyanosis, no joint deformities Skin: No rashes, no pallor Psychiatry: Affect normal // no auditory hallucinations   Data Reviewed:  Labs reviewed: Na 130, K 3.9, Cr 0.62, Hgb 10.1   Family Communication: Pt in room, family not at bedside  Disposition: Status is: Inpatient Remains inpatient appropriate because: Severity of illness  Planned Discharge Destination: Home    Author: Marylu Lund, MD 07/31/2022 5:25 PM  For on call review [email or web address].

## 2022-07-31 NOTE — TOC Progression Note (Signed)
Transition of Care [facility]) - Progression Note    Patient Details  Name: [name] MRN: [id number] Date of Birth: 1981/06/16  Transition of Care [facility]) CM/SW Contact  Levonne Lapping, RN Phone Number: 07/31/2022, 12:51 PM  Clinical Narrative:     Met with Patient bedside to discuss current DC recommendations. OP PT has been recommended. Patient states he is unsure if he will remain in the area at DC. Stated he may have to go to his Sister's in [place] if his "benefits" and "money" don't come in. Patient states that his Brother  could probably drive him to [place]. CM placed call to sister to discuss this DC plan, per patient's request ( [phone])  and left a voice message with call back information.   Rolling walker has also been recommended and Patient has no preference of DME provider.  [employer] will provide rolling walker for patient and will deliver to bedside prior to DC.   TOC will continue to follow patient for any additional discharge needs             Expected Discharge Plan and Services                                               Social Determinants of Health (SDOH) Interventions SDOH Screenings   Food Insecurity: No Food Insecurity (07/26/2022)  Housing: Medium Risk (07/26/2022)  Transportation Needs: No Transportation Needs (07/26/2022)  Utilities: Not At Risk (07/26/2022)  Tobacco Use: Low Risk  (07/19/2022)    Readmission Risk Interventions     No data to display

## 2022-08-01 DIAGNOSIS — J189 Pneumonia, unspecified organism: Secondary | ICD-10-CM | POA: Diagnosis not present

## 2022-08-01 DIAGNOSIS — R2241 Localized swelling, mass and lump, right lower limb: Secondary | ICD-10-CM | POA: Diagnosis not present

## 2022-08-01 DIAGNOSIS — R0902 Hypoxemia: Secondary | ICD-10-CM | POA: Diagnosis not present

## 2022-08-01 LAB — RESPIRATORY PANEL BY PCR

## 2022-08-01 LAB — GLUCOSE, CAPILLARY
Glucose-Capillary: 131 mg/dL — ABNORMAL HIGH (ref 70–99)
Glucose-Capillary: 108 mg/dL — ABNORMAL HIGH (ref 70–99)
Glucose-Capillary: 142 mg/dL — ABNORMAL HIGH (ref 70–99)
Glucose-Capillary: 126 mg/dL — ABNORMAL HIGH (ref 70–99)

## 2022-08-01 LAB — COMPREHENSIVE METABOLIC PANEL
ALT: 18 U/L (ref 0–44)
AST: 30 U/L (ref 15–41)
Albumin: 2.8 g/dL — ABNORMAL LOW (ref 3.5–5.0)
Alkaline Phosphatase: 109 U/L (ref 38–126)
Anion gap: 8 (ref 5–15)
BUN: 10 mg/dL (ref 6–20)
CO2: 23 mmol/L (ref 22–32)
Calcium: 10.7 mg/dL — ABNORMAL HIGH (ref 8.9–10.3)
Chloride: 100 mmol/L (ref 98–111)
Creatinine, Ser: 0.69 mg/dL (ref 0.61–1.24)
GFR, Estimated: >60 mL/min (ref >60)
Glucose, Bld: 101 mg/dL — ABNORMAL HIGH (ref 70–99)
Potassium: 4.2 mmol/L (ref 3.5–5.1)
Sodium: 131 mmol/L — ABNORMAL LOW (ref 135–145)
Total Bilirubin: 1.4 mg/dL — ABNORMAL HIGH (ref 0.3–1.2)
Total Protein: 8 g/dL (ref 6.5–8.1)

## 2022-08-01 LAB — CBC
HCT: 28.9 % — ABNORMAL LOW (ref 39.0–52.0)
Hemoglobin: 9.7 g/dL — ABNORMAL LOW (ref 13.0–17.0)
MCH: 36.3 pg — ABNORMAL HIGH (ref 26.0–34.0)
MCHC: 33.6 g/dL (ref 30.0–36.0)
MCV: 108.2 fL — ABNORMAL HIGH (ref 80.0–100.0)
Platelets: 236 10*3/uL (ref 150–400)
RBC: 2.67 MIL/uL — ABNORMAL LOW (ref 4.22–5.81)
RDW: 13.3 % (ref 11.5–15.5)
WBC: 12 10*3/uL — ABNORMAL HIGH (ref 4.0–10.5)
nRBC: 0 % (ref 0.0–0.2)

## 2022-08-01 LAB — CULTURE, BLOOD (ROUTINE X 2)
Culture: NO GROWTH
Culture: NO GROWTH
Special Requests: ADEQUATE
Special Requests: ADEQUATE

## 2022-08-01 LAB — PROCALCITONIN: Procalcitonin: 0.2 ng/mL

## 2022-08-01 MED ORDER — PANTOPRAZOLE SODIUM 40 MG PO TBEC
40.0000 mg | DELAYED_RELEASE_TABLET | Freq: Every day | ORAL | Status: DC
  Administered 2022-08-01 – 2022-08-08 (×8): 40 mg via ORAL
  Filled 2022-08-01 (×8): qty 1

## 2022-08-01 MED ORDER — BENZONATATE 100 MG PO CAPS
100.0000 mg | ORAL_CAPSULE | Freq: Three times a day (TID) | ORAL | Status: DC | PRN
  Administered 2022-08-01 – 2022-08-06 (×11): 100 mg via ORAL
  Filled 2022-08-01 (×12): qty 1

## 2022-08-01 NOTE — Progress Notes (Signed)
Progress Note   Patient: [name] [id number]:[id number] DOB: Aug 03, 1980 DOA: 07/16/2022     15 DOS: the patient was seen and examined on 08/01/2022   Brief hospital course: Bryan Goin is a 42 y.o. male with a history of right leg mass, DVT, PE. Patient presented secondary to shortness of breath and cough and was found to have influenza infection and pneumonia. Patient developed worsening hypoxia complicated by worsening mentation requiring ICU admission and intubation. Patient managed on Tamiflu for Influenza A infection and empiric antibiotics community-acquired pneumonia followed by eventual transition to Unasyn after positive streptococcus pneumoniae urine antigen test. During admission, patient developed diffuse ST elevation concerning for acute pericarditis, managed initially with steroids and transitioned to colchicine.  Assessment and Plan: Acute respiratory failure with hypoxia Secondary to pneumonia. Patient was admitted by critical care service on 06/2822, admitted to the ICU and eventual intubated day of admission. Patient managed on mechanical ventilation until 07/19/22 when he was successfully extubated. Patient weaned to room air -new dry cough this AM. Currently remains on RA -Recent CXR reviewed, no acute change   Influenza A infection Noted on admission. Patient completed oseltamivir course.   Community acquired pneumonia Secondary to streptococcus pneumoniae.  -completed course of abx   Fever recent recurrent fevers recently -Associated with multiple bouts of nausea/vomiting and now dry cough -Procal stable at 0.20 -UA neg, blood cx pending. CXR and abd xray reviewed, unremarkable for acute process  Acute metabolic encephalopathy Secondary to acute illness. Resolved.   Acute pericarditis Likely secondary to viral illness. Transthoracic Echocardiogram with low-normal LVEF of 50-55% and no regional wall motion abnormalities. Patient managed initially with high-dose  steroids and transitioned to Colchicine. 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 Associated numerous pulmonary lesions. No metastatic/primary lesions noted in CT abdomen/pelvis. US guided core biopsy of right calf mass performed on 1/2.  -Path results remain pending today  Sinus tachycardia Likely secondary to chronic illness as well as hx PE -Remains tachycardic this AM, increased metoprolol to 37.'5mg'$  .  -started basal IVF   Liver cirrhosis No ascites noted on CT imaging.   Hypokalemia Resolved with repletion. Complicated by hypomagnesemia.   Hypomagnesemia Resolved.   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 If patient continues with n/v, would consider f/u repeat CT given concerns of metastatic disease      Subjective: Had multiple bouts of n/v yesterday, another episode of vomiting this AM  Physical Exam: Vitals:   08/01/22 0650 08/01/22 0756 08/01/22 0759 08/01/22 1152  BP:  (!) 107/94  104/61  Pulse:      Resp:  20  20  Temp: 99.3 F (37.4 C) 98.4 F (36.9 C)  97.8 F (36.6 C)  TempSrc: Oral Oral  Oral  SpO2:   95%   Weight:      Height:       General exam: Awake, laying in bed, in nad Respiratory system: Normal respiratory effort, no wheezing Cardiovascular system: regular rate, s1, s2 Gastrointestinal system: Soft, nondistended, positive BS Central nervous system: CN2-12 grossly intact, strength intact Extremities: Perfused, no clubbing Skin: Normal skin turgor, no notable skin lesions seen Psychiatry: Mood normal // no visual hallucinations   Data Reviewed:  Labs reviewed: Na 131, K 4.2, Cr 0.69,  Hgb  9.7   Family Communication: Pt in room, family not at bedside  Disposition: Status is: Inpatient Remains inpatient appropriate because: Severity of illness  Planned Discharge Destination: Home    Author: Marylu Lund, MD 08/01/2022 3:05 PM  For on call review [email or web address].

## 2022-08-02 ENCOUNTER — Inpatient Hospital Stay (HOSPITAL_COMMUNITY): Payer: BC Managed Care – PPO

## 2022-08-02 DIAGNOSIS — J189 Pneumonia, unspecified organism: Secondary | ICD-10-CM | POA: Diagnosis not present

## 2022-08-02 DIAGNOSIS — R2241 Localized swelling, mass and lump, right lower limb: Secondary | ICD-10-CM | POA: Diagnosis not present

## 2022-08-02 DIAGNOSIS — R0902 Hypoxemia: Secondary | ICD-10-CM | POA: Diagnosis not present

## 2022-08-02 LAB — RESP PANEL BY RT-PCR (RSV, FLU A&B, COVID)  RVPGX2
Influenza A by PCR: NEGATIVE
Influenza B by PCR: NEGATIVE
Resp Syncytial Virus by PCR: POSITIVE — AB
SARS Coronavirus 2 by RT PCR: NEGATIVE

## 2022-08-02 LAB — COMPREHENSIVE METABOLIC PANEL
ALT: 17 U/L (ref 0–44)
AST: 28 U/L (ref 15–41)
Albumin: 2.7 g/dL — ABNORMAL LOW (ref 3.5–5.0)
Alkaline Phosphatase: 91 U/L (ref 38–126)
Anion gap: 7 (ref 5–15)
BUN: 5 mg/dL — ABNORMAL LOW (ref 6–20)
CO2: 25 mmol/L (ref 22–32)
Calcium: 10.6 mg/dL — ABNORMAL HIGH (ref 8.9–10.3)
Chloride: 100 mmol/L (ref 98–111)
Creatinine, Ser: 0.73 mg/dL (ref 0.61–1.24)
GFR, Estimated: >60 mL/min (ref >60)
Glucose, Bld: 106 mg/dL — ABNORMAL HIGH (ref 70–99)
Potassium: 4 mmol/L (ref 3.5–5.1)
Sodium: 132 mmol/L — ABNORMAL LOW (ref 135–145)
Total Bilirubin: 1.2 mg/dL (ref 0.3–1.2)
Total Protein: 7.7 g/dL (ref 6.5–8.1)

## 2022-08-02 LAB — CBC
HCT: 29 % — ABNORMAL LOW (ref 39.0–52.0)
Hemoglobin: 9.7 g/dL — ABNORMAL LOW (ref 13.0–17.0)
MCH: 36.2 pg — ABNORMAL HIGH (ref 26.0–34.0)
MCHC: 33.4 g/dL (ref 30.0–36.0)
MCV: 108.2 fL — ABNORMAL HIGH (ref 80.0–100.0)
Platelets: 221 10*3/uL (ref 150–400)
RBC: 2.68 MIL/uL — ABNORMAL LOW (ref 4.22–5.81)
RDW: 13.5 % (ref 11.5–15.5)
WBC: 11.2 10*3/uL — ABNORMAL HIGH (ref 4.0–10.5)
nRBC: 0 % (ref 0.0–0.2)

## 2022-08-02 LAB — PROCALCITONIN: Procalcitonin: 0.22 ng/mL

## 2022-08-02 LAB — GLUCOSE, CAPILLARY
Glucose-Capillary: 111 mg/dL — ABNORMAL HIGH (ref 70–99)
Glucose-Capillary: 133 mg/dL — ABNORMAL HIGH (ref 70–99)
Glucose-Capillary: 92 mg/dL (ref 70–99)
Glucose-Capillary: 177 mg/dL — ABNORMAL HIGH (ref 70–99)

## 2022-08-02 MED ORDER — GUAIFENESIN-DM 100-10 MG/5ML PO SYRP
5.0000 mL | ORAL_SOLUTION | ORAL | Status: DC | PRN
  Administered 2022-08-02 – 2022-08-06 (×11): 5 mL via ORAL
  Filled 2022-08-02 (×11): qty 5

## 2022-08-02 MED ORDER — LACTATED RINGERS IV BOLUS
250.0000 mL | Freq: Once | INTRAVENOUS | Status: AC
  Administered 2022-08-02: 250 mL via INTRAVENOUS

## 2022-08-02 MED ORDER — LACTATED RINGERS IV BOLUS
1000.0000 mL | Freq: Once | INTRAVENOUS | Status: AC
  Administered 2022-08-02: 1000 mL via INTRAVENOUS

## 2022-08-02 MED ORDER — IOHEXOL 350 MG/ML SOLN
75.0000 mL | Freq: Once | INTRAVENOUS | Status: AC | PRN
  Administered 2022-08-02: 75 mL via INTRAVENOUS

## 2022-08-02 MED ORDER — IOHEXOL 9 MG/ML PO SOLN
500.0000 mL | ORAL | Status: AC
  Administered 2022-08-02 (×2): 500 mL via ORAL

## 2022-08-02 NOTE — Progress Notes (Signed)
Respiratory virus panel came back positive for RSV which is new, pt precautions changed

## 2022-08-02 NOTE — Progress Notes (Signed)
Progress Note   Patient: [name] UXN:[id number] DOB: [birth year] DOA: 07/16/2022     16 DOS: the patient was seen and examined on 08/02/2022   Brief hospital course: [name] is a 42 y.o. male with a history of right leg mass, DVT, PE. Patient presented secondary to shortness of breath and cough and was found to have influenza infection and pneumonia. Patient developed worsening hypoxia complicated by worsening mentation requiring ICU admission and intubation. Patient managed on Tamiflu for Influenza A infection and empiric antibiotics community-acquired pneumonia followed by eventual transition to Unasyn after positive streptococcus pneumoniae urine antigen test. During admission, patient developed diffuse ST elevation concerning for acute pericarditis, managed initially with steroids and transitioned to colchicine.  Assessment and Plan: Acute respiratory failure with hypoxia Secondary to pneumonia. Patient was admitted by critical care service on 06/2822, admitted to the ICU and eventual intubated day of admission. Patient managed on mechanical ventilation until 07/19/22 when he was successfully extubated. Patient weaned to room air -new dry cough recently -Recent CXR reviewed, no acute change -See below, now testing pos for RSV   Influenza A infection Noted on admission. Patient completed oseltamivir course.  RSV not present on admission, with sepsis -Presenting viral panel pos for flu only -Recent nausea/vomiting and now cough with continued fevers -repeat viral swab confirms RSV -Pt tachycardic with fevers and tachypnea -Will cont aggressive IVF hydration   Community acquired pneumonia Secondary to streptococcus pneumoniae.  -completed course of abx   Fever secondary to RSV recent recurrent fevers recently -Associated with multiple bouts of nausea/vomiting and now dry cough -Procal stable at 0.20 -Now RSV pos per above -have ordered f/u CT chest, abd, pelvis to f/u on  known mets  Acute metabolic encephalopathy Secondary to acute illness. Resolved.   Acute pericarditis Likely secondary to viral illness. Transthoracic Echocardiogram with low-normal LVEF of 50-55% and no regional wall motion abnormalities. Patient managed initially with high-dose steroids and transitioned to Colchicine. 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 Associated numerous pulmonary lesions. No metastatic/primary lesions noted in CT abdomen/pelvis. US guided core biopsy of right calf mass performed on 1/2.  -Path results remain pending   Sinus tachycardia Likely secondary to chronic illness as well as hx PE -Remains tachycardic this AM, continue metoprolol to 37.'5mg'$  .  -cont IVF hydration   Liver cirrhosis No ascites noted on CT imaging.   Hypokalemia Resolved with repletion. Complicated by hypomagnesemia.   Hypomagnesemia Resolved.   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      Subjective: Feelling generally worse today, coughing  Physical Exam: Vitals:   08/02/22 0841 08/02/22 1121 08/02/22 1453 08/02/22 1601  BP:  127/71  131/80  Pulse:  (!) 132  (!) 117  Resp:  20 (!) 27 (!) 22  Temp:  (!) 103 F (39.4 C) 98 F (36.7 C) 98 F (36.7 C)  TempSrc:  Oral  Oral  SpO2: 97% 97%  97%  Weight:      Height:       General exam: Conversant, in no acute distress Respiratory system: normal chest rise, clear, no audible wheezing Cardiovascular system: regular rhythm, s1-s2 Gastrointestinal system: Nondistended, nontender, pos BS Central nervous system: No seizures,  no  tremors Extremities: No cyanosis, no joint deformities Skin: No rashes, no pallor Psychiatry: Affect normal // no auditory hallucinations   Data Reviewed:  Labs reviewed: Na 132, K 4.0, Cr 0.73, Hgb 9.7   Family Communication: Pt in room, family not at bedside  Disposition: Status is: Inpatient Remains inpatient appropriate because: Severity of illness  Planned Discharge Destination: Home    Author: Marylu Lund, MD 08/02/2022 4:36 PM  For on call review [email or web address].

## 2022-08-02 NOTE — Progress Notes (Signed)
Pt's HR 136-137 Temp checked was 102.5, prn tylenol given

## 2022-08-03 DIAGNOSIS — J189 Pneumonia, unspecified organism: Secondary | ICD-10-CM | POA: Diagnosis not present

## 2022-08-03 DIAGNOSIS — R0902 Hypoxemia: Secondary | ICD-10-CM | POA: Diagnosis not present

## 2022-08-03 DIAGNOSIS — R2241 Localized swelling, mass and lump, right lower limb: Secondary | ICD-10-CM | POA: Diagnosis not present

## 2022-08-03 LAB — CBC
HCT: 27.7 % — ABNORMAL LOW (ref 39.0–52.0)
Hemoglobin: 9.2 g/dL — ABNORMAL LOW (ref 13.0–17.0)
MCH: 35.7 pg — ABNORMAL HIGH (ref 26.0–34.0)
MCHC: 33.2 g/dL (ref 30.0–36.0)
MCV: 107.4 fL — ABNORMAL HIGH (ref 80.0–100.0)
Platelets: 209 10*3/uL (ref 150–400)
RBC: 2.58 MIL/uL — ABNORMAL LOW (ref 4.22–5.81)
RDW: 13.2 % (ref 11.5–15.5)
WBC: 6.9 10*3/uL (ref 4.0–10.5)
nRBC: 0 % (ref 0.0–0.2)

## 2022-08-03 LAB — GLUCOSE, CAPILLARY
Glucose-Capillary: 142 mg/dL — ABNORMAL HIGH (ref 70–99)
Glucose-Capillary: 114 mg/dL — ABNORMAL HIGH (ref 70–99)
Glucose-Capillary: 89 mg/dL (ref 70–99)
Glucose-Capillary: 161 mg/dL — ABNORMAL HIGH (ref 70–99)

## 2022-08-03 LAB — COMPREHENSIVE METABOLIC PANEL
ALT: 16 U/L (ref 0–44)
AST: 27 U/L (ref 15–41)
Albumin: 2.6 g/dL — ABNORMAL LOW (ref 3.5–5.0)
Alkaline Phosphatase: 88 U/L (ref 38–126)
Anion gap: 8 (ref 5–15)
BUN: 5 mg/dL — ABNORMAL LOW (ref 6–20)
CO2: 24 mmol/L (ref 22–32)
Calcium: 10.5 mg/dL — ABNORMAL HIGH (ref 8.9–10.3)
Chloride: 98 mmol/L (ref 98–111)
Creatinine, Ser: 0.79 mg/dL (ref 0.61–1.24)
GFR, Estimated: >60 mL/min (ref >60)
Glucose, Bld: 93 mg/dL (ref 70–99)
Potassium: 4 mmol/L (ref 3.5–5.1)
Sodium: 130 mmol/L — ABNORMAL LOW (ref 135–145)
Total Bilirubin: 1.1 mg/dL (ref 0.3–1.2)
Total Protein: 7.3 g/dL (ref 6.5–8.1)

## 2022-08-03 LAB — MAGNESIUM: Magnesium: 1.5 mg/dL — ABNORMAL LOW (ref 1.7–2.4)

## 2022-08-03 MED ORDER — MAGNESIUM SULFATE 4 GM/100ML IV SOLN
4.0000 g | Freq: Once | INTRAVENOUS | Status: AC
  Administered 2022-08-03: 4 g via INTRAVENOUS
  Filled 2022-08-03: qty 100

## 2022-08-03 NOTE — Progress Notes (Signed)
Progress Note   Patient: [name] [id number]:[id number] DOB: [birth year] DOA: 07/16/2022     17 DOS: the patient was seen and examined on 08/03/2022   Brief hospital course: [name] is a 42 y.o. male with a history of right leg mass, DVT, PE. Patient presented secondary to shortness of breath and cough and was found to have influenza infection and pneumonia. Patient developed worsening hypoxia complicated by worsening mentation requiring ICU admission and intubation. Patient managed on Tamiflu for Influenza A infection and empiric antibiotics community-acquired pneumonia followed by eventual transition to Unasyn after positive streptococcus pneumoniae urine antigen test. During admission, patient developed diffuse ST elevation concerning for acute pericarditis, managed initially with steroids and transitioned to colchicine.  Assessment and Plan: Acute respiratory failure with hypoxia Secondary to pneumonia. Patient was admitted by critical care service on 06/2822, admitted to the ICU and eventual intubated day of admission. Patient managed on mechanical ventilation until 07/19/22 when he was successfully extubated. Patient weaned to room air -new dry cough recently -Recent CXR reviewed, no acute change -See below, now testing pos for RSV, remains on minimal O2 -reports feeling better this AM. Still febrile, tachycardic, tachypnea   Influenza A infection Noted on admission. Patient completed oseltamivir course.  RSV not present on admission, with sepsis -Presenting viral panel pos for flu only -Recent nausea/vomiting and now cough with continued fevers -repeat viral swab confirms RSV -Pt tachycardic with fevers and tachypnea, but feeing better today -Will cont aggressive IVF hydration   Community acquired pneumonia Secondary to streptococcus pneumoniae.  -completed course of abx   Fever secondary to RSV recent recurrent fevers recently -Associated with multiple bouts of  nausea/vomiting and now dry cough -Procal stable at 0.20 -Now RSV pos per above -repeat CT chest, abd, pelvis reviewed. Progression of pulm mets, no other changes  Acute metabolic encephalopathy Secondary to acute illness. Resolved.   Acute pericarditis Likely secondary to viral illness. Transthoracic Echocardiogram with low-normal LVEF of 50-55% and no regional wall motion abnormalities. Patient managed initially with high-dose steroids and transitioned to Colchicine. 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 Associated numerous pulmonary lesions. No metastatic/primary lesions noted in CT abdomen/pelvis. US guided core biopsy of right calf mass performed on 1/2.  -Path results remain pending   Sinus tachycardia Likely secondary to chronic illness as well as hx PE -Remains tachycardic this AM, continue metoprolol to 37.'5mg'$  .  -cont IVF hydration   Liver cirrhosis No ascites noted on CT imaging.   Hypokalemia Resolved with repletion. Complicated by hypomagnesemia.   Hypomagnesemia Resolved.   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 Still having fevers  Physical Exam: Vitals:   08/03/22 0809 08/03/22 0830 08/03/22 1205 08/03/22 1336  BP:  123/75 114/75   Pulse:      Resp:  20 (!) 28   Temp:  (!) 101 F (38.3 C) (!) 102.9 F (39.4 C) (!) 100.9 F (38.3 C)  TempSrc:  Oral Oral Oral  SpO2: 98%     Weight:      Height:       General exam: Awake, laying in bed,  in  nad Respiratory system: Normal respiratory effort, no wheezing Cardiovascular system: regular rate, s1, s2 Gastrointestinal system: Soft, nondistended, positive BS Central nervous system: CN2-12 grossly intact, strength intact Extremities: Perfused, no clubbing Skin: Normal skin turgor, no notable skin lesions seen Psychiatry: Mood normal // no visual hallucinations   Data Reviewed:  Labs reviewed: Na 130, K 4.0, Cr 0.79, Hgb 9.2, Mg 1.5   Family Communication: Pt in room, family not at bedside  Disposition: Status is: Inpatient Remains inpatient appropriate because: Severity of illness  Planned Discharge Destination: Home    Author: Marylu Lund, MD 08/03/2022 2:47 PM  For on call review [email or web address].

## 2022-08-03 NOTE — Progress Notes (Signed)
Tylenol administered for temp of 102.43F. Rechecked after 1hr and down to 100.43F.  HR remains NST 110-120s.  Pt states that he feels fine.  Dr. Wyline Copas aware, no new orders at this time.

## 2022-08-04 DIAGNOSIS — J189 Pneumonia, unspecified organism: Secondary | ICD-10-CM | POA: Diagnosis not present

## 2022-08-04 DIAGNOSIS — R0902 Hypoxemia: Secondary | ICD-10-CM | POA: Diagnosis not present

## 2022-08-04 DIAGNOSIS — R2241 Localized swelling, mass and lump, right lower limb: Secondary | ICD-10-CM | POA: Diagnosis not present

## 2022-08-04 LAB — CBC
HCT: 27.3 % — ABNORMAL LOW (ref 39.0–52.0)
Hemoglobin: 9 g/dL — ABNORMAL LOW (ref 13.0–17.0)
MCH: 35.6 pg — ABNORMAL HIGH (ref 26.0–34.0)
MCHC: 33 g/dL (ref 30.0–36.0)
MCV: 107.9 fL — ABNORMAL HIGH (ref 80.0–100.0)
Platelets: 201 10*3/uL (ref 150–400)
RBC: 2.53 MIL/uL — ABNORMAL LOW (ref 4.22–5.81)
RDW: 13.3 % (ref 11.5–15.5)
WBC: 4.4 10*3/uL (ref 4.0–10.5)
nRBC: 0 % (ref 0.0–0.2)

## 2022-08-04 LAB — MAGNESIUM: Magnesium: 1.9 mg/dL (ref 1.7–2.4)

## 2022-08-04 LAB — COMPREHENSIVE METABOLIC PANEL
ALT: 15 U/L (ref 0–44)
AST: 27 U/L (ref 15–41)
Albumin: 2.6 g/dL — ABNORMAL LOW (ref 3.5–5.0)
Alkaline Phosphatase: 85 U/L (ref 38–126)
Anion gap: 6 (ref 5–15)
BUN: 5 mg/dL — ABNORMAL LOW (ref 6–20)
CO2: 25 mmol/L (ref 22–32)
Calcium: 10 mg/dL (ref 8.9–10.3)
Chloride: 99 mmol/L (ref 98–111)
Creatinine, Ser: 0.72 mg/dL (ref 0.61–1.24)
GFR, Estimated: >60 mL/min (ref >60)
Glucose, Bld: 119 mg/dL — ABNORMAL HIGH (ref 70–99)
Potassium: 3.8 mmol/L (ref 3.5–5.1)
Sodium: 130 mmol/L — ABNORMAL LOW (ref 135–145)
Total Bilirubin: 0.8 mg/dL (ref 0.3–1.2)
Total Protein: 7.6 g/dL (ref 6.5–8.1)

## 2022-08-04 LAB — GLUCOSE, CAPILLARY
Glucose-Capillary: 100 mg/dL — ABNORMAL HIGH (ref 70–99)
Glucose-Capillary: 107 mg/dL — ABNORMAL HIGH (ref 70–99)
Glucose-Capillary: 120 mg/dL — ABNORMAL HIGH (ref 70–99)
Glucose-Capillary: 163 mg/dL — ABNORMAL HIGH (ref 70–99)

## 2022-08-04 NOTE — Progress Notes (Signed)
Progress Note   Patient: [name] [id number]:[id number] DOB: March 05, 1981 DOA: 07/16/2022     18 DOS: the patient was seen and examined on 08/04/2022   Brief hospital course: [name] is a 42 y.o. male with a history of right leg mass, DVT, PE. Patient presented secondary to shortness of breath and cough and was found to have influenza infection and pneumonia. Patient developed worsening hypoxia complicated by worsening mentation requiring ICU admission and intubation. Patient managed on Tamiflu for Influenza A infection and empiric antibiotics community-acquired pneumonia followed by eventual transition to Unasyn after positive streptococcus pneumoniae urine antigen test. During admission, patient developed diffuse ST elevation concerning for acute pericarditis, managed initially with steroids and transitioned to colchicine.  Assessment and Plan: Acute respiratory failure with hypoxia Secondary to pneumonia. Patient was admitted by critical care service on 06/2822, admitted to the ICU and eventual intubated day of admission. Patient managed on mechanical ventilation until 07/19/22 when he was successfully extubated. Patient weaned to room air -new dry cough recently -Recent CXR reviewed, no acute change -See below, now testing pos for RSV, remains on minimal O2 -Pt reports feeling better today today. Still coughing   Influenza A infection Noted on admission. Patient completed oseltamivir course.  RSV not present on admission, with sepsis -Presenting viral panel pos for flu only -Recent nausea/vomiting and now cough with continued fevers -repeat viral swab confirms RSV -Pt tachycardic with fevers and tachypnea, but feeing better today -Will cont aggressive IVF hydration   Community acquired pneumonia Secondary to streptococcus pneumoniae.  -completed course of abx   Fever secondary to RSV recent recurrent fevers recently -Associated with multiple bouts of nausea/vomiting and now dry  cough -Procal stable at 0.20 -Now RSV pos per above -repeat CT chest, abd, pelvis reviewed. Progression of pulm mets, no other changes  Acute metabolic encephalopathy Secondary to acute illness. Resolved.   Acute pericarditis Likely secondary to viral illness. Transthoracic Echocardiogram with low-normal LVEF of 50-55% and no regional wall motion abnormalities. Patient managed initially with high-dose steroids and transitioned to Colchicine. 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 Associated numerous pulmonary lesions. No metastatic/primary lesions noted in CT abdomen/pelvis. US guided core biopsy of right calf mass performed on 1/2.  -Called pathology. Path remains pending, reported had been sent to outside lab  Sinus tachycardia Likely secondary to chronic illness as well as hx PE -Remains tachycardic this AM although improved, continue metoprolol to 37.'5mg'$  .  -cont IVF hydration   Liver cirrhosis No ascites noted on CT imaging.   Hypokalemia Resolved with repletion. Complicated by hypomagnesemia.   Hypomagnesemia Resolved.   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 -Continue Lovenox -Per benefits check: Lovenox copay is $35.00, Eliquis copay is $60.00, Xarelto copay is $60.00  N/V -Began recently, multiple bouts yesterday, likely secondary to new RSV diagnosis above -Abd xray reviewed, unremarkable -Cont antiemetic as needed  Hypomagnesemia -normalized      Subjective: Reported feeling better today  Physical Exam: Vitals:   08/04/22 0737 08/04/22 0747 08/04/22 1100 08/04/22 1130  BP:   118/72   Pulse: (!) 105 (!) 110 97   Resp:  20 (!) 21 20  Temp: (!) 100.4 F (38 C)  98.5 F (36.9 C)   TempSrc: Oral  Oral   SpO2:  97% 97%   Weight:      Height:       General exam: Conversant, in no acute  distress Respiratory  system: normal chest rise, clear, no audible wheezing Cardiovascular system: regular rhythm, s1-s2 Gastrointestinal system: Nondistended, nontender, pos BS Central nervous system: No seizures, no tremors Extremities: No cyanosis, no joint deformities Skin: No rashes, no pallor Psychiatry: Affect normal // no auditory hallucinations   Data Reviewed:  Labs reviewed: Na 130, K 3.8, Cr 0.72, Hgb 9.0, Mg 1.9   Family Communication: Pt in room, family not at bedside  Disposition: Status is: Inpatient Remains inpatient appropriate because: Severity of illness  Planned Discharge Destination: Home    Author: Marylu Lund, MD 08/04/2022 3:38 PM  For on call review [email or web address]

## 2022-08-05 DIAGNOSIS — R0902 Hypoxemia: Secondary | ICD-10-CM | POA: Diagnosis not present

## 2022-08-05 DIAGNOSIS — R2241 Localized swelling, mass and lump, right lower limb: Secondary | ICD-10-CM | POA: Diagnosis not present

## 2022-08-05 DIAGNOSIS — J189 Pneumonia, unspecified organism: Secondary | ICD-10-CM | POA: Diagnosis not present

## 2022-08-05 LAB — COMPREHENSIVE METABOLIC PANEL
ALT: 15 U/L (ref 0–44)
AST: 25 U/L (ref 15–41)
Albumin: 2.2 g/dL — ABNORMAL LOW (ref 3.5–5.0)
Alkaline Phosphatase: 77 U/L (ref 38–126)
Anion gap: 5 (ref 5–15)
BUN: <5 mg/dL — ABNORMAL LOW (ref 6–20)
CO2: 22 mmol/L (ref 22–32)
Calcium: 9.5 mg/dL (ref 8.9–10.3)
Chloride: 102 mmol/L (ref 98–111)
Creatinine, Ser: 0.55 mg/dL — ABNORMAL LOW (ref 0.61–1.24)
GFR, Estimated: >60 mL/min (ref >60)
Glucose, Bld: 91 mg/dL (ref 70–99)
Potassium: 3.9 mmol/L (ref 3.5–5.1)
Sodium: 129 mmol/L — ABNORMAL LOW (ref 135–145)
Total Bilirubin: 0.6 mg/dL (ref 0.3–1.2)
Total Protein: 6.4 g/dL — ABNORMAL LOW (ref 6.5–8.1)

## 2022-08-05 LAB — CBC
HCT: 25.2 % — ABNORMAL LOW (ref 39.0–52.0)
Hemoglobin: 8.3 g/dL — ABNORMAL LOW (ref 13.0–17.0)
MCH: 35.5 pg — ABNORMAL HIGH (ref 26.0–34.0)
MCHC: 32.9 g/dL (ref 30.0–36.0)
MCV: 107.7 fL — ABNORMAL HIGH (ref 80.0–100.0)
Platelets: 216 10*3/uL (ref 150–400)
RBC: 2.34 MIL/uL — ABNORMAL LOW (ref 4.22–5.81)
RDW: 13.3 % (ref 11.5–15.5)
WBC: 4.7 10*3/uL (ref 4.0–10.5)
nRBC: 0 % (ref 0.0–0.2)

## 2022-08-05 LAB — GLUCOSE, CAPILLARY
Glucose-Capillary: 109 mg/dL — ABNORMAL HIGH (ref 70–99)
Glucose-Capillary: 109 mg/dL — ABNORMAL HIGH (ref 70–99)
Glucose-Capillary: 114 mg/dL — ABNORMAL HIGH (ref 70–99)
Glucose-Capillary: 127 mg/dL — ABNORMAL HIGH (ref 70–99)

## 2022-08-05 LAB — CULTURE, BLOOD (ROUTINE X 2)
Culture: NO GROWTH
Special Requests: ADEQUATE

## 2022-08-05 MED ORDER — FUROSEMIDE 10 MG/ML IJ SOLN
40.0000 mg | Freq: Once | INTRAMUSCULAR | Status: AC
  Administered 2022-08-05: 40 mg via INTRAVENOUS
  Filled 2022-08-05: qty 4

## 2022-08-05 NOTE — Progress Notes (Signed)
Progress Note   Patient: [name] MLY:[id number] DOB: [birth year] DOA: 07/16/2022     19 DOS: the patient was seen and examined on 08/05/2022   Brief hospital course: [name] is a 42 y.o. male with a history of right leg mass, DVT, PE. Patient presented secondary to shortness of breath and cough and was found to have influenza infection and pneumonia. Patient developed worsening hypoxia complicated by worsening mentation requiring ICU admission and intubation. Patient managed on Tamiflu for Influenza A infection and empiric antibiotics community-acquired pneumonia followed by eventual transition to Unasyn after positive streptococcus pneumoniae urine antigen test. During admission, patient developed diffuse ST elevation concerning for acute pericarditis, managed initially with steroids and transitioned to colchicine.  Assessment and Plan: Acute respiratory failure with hypoxia Secondary to pneumonia. Patient was admitted by critical care service on 06/2822, admitted to the ICU and eventual intubated day of admission. Patient managed on mechanical ventilation until 07/19/22 when he was successfully extubated. Patient weaned to room air -Recent CXR reviewed, no acute change -See below, recently tested pos for RSV on repeat viral testing, remains on minimal O2 -Given IVF hydration and now feeling better but still coughing   Influenza A infection Noted on admission. Patient completed oseltamivir course.  RSV not present on admission, with sepsis -Presenting viral panel pos for flu only -Recent nausea/vomiting and now cough with continued fevers -repeat viral swab confirms RSV -Pt reports feeling better today, but still with cough -will hold further IVF secondary to LE swelling   Community acquired pneumonia Secondary to streptococcus pneumoniae.  -completed course of abx   Fever secondary to RSV recent recurrent fevers recently -Associated with multiple bouts of nausea/vomiting  and now dry cough -RSV newly pos per above -repeat CT chest, abd, pelvis reviewed. Progression of pulm mets, no other changes  Acute metabolic encephalopathy Secondary to acute illness. Resolved.   Acute pericarditis Likely secondary to viral illness. Transthoracic Echocardiogram with low-normal LVEF of 50-55% and no regional wall motion abnormalities. Patient managed initially with high-dose steroids and transitioned to Colchicine. 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 Associated numerous pulmonary lesions. No metastatic/primary lesions noted in CT abdomen/pelvis. US guided core biopsy of right calf mass performed on 1/2.  -Biopsy remains pending 1/16. Called pathology  Sinus tachycardia Likely secondary to chronic illness as well as hx PE -Remains tachycardic this AM although improved, continue metoprolol to 37.'5mg'$  .    Liver cirrhosis No ascites noted on CT imaging.   Hypokalemia Resolved with repletion. Complicated by hypomagnesemia.   Hypomagnesemia Resolved.   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 -Continue Lovenox -Per benefits check: Lovenox copay is $35.00, Eliquis copay is $60.00, Xarelto copay is $60.00  N/V -Began recently, multiple bouts yesterday, likely secondary to new RSV diagnosis above -Abd xray reviewed, unremarkable -Cont antiemetic as needed  Hypomagnesemia -normalized  Moderate protein calorie malnutrition -Dietitian following  Hyponatremia -Suspect related to vol overload s/p volume resuscitation recently -Hold further IVF, give trial of lasix x1      Subjective: Still coughing, but reports feeling even better overall today  Physical Exam: Vitals:   08/05/22 0747 08/05/22 0900 08/05/22 0902 08/05/22 1100  BP: 128/72  135/72   Pulse: (!) 109  (!) 108 (!) 101  Resp: '20 20  20  '$ Temp: 98.5 F  (36.9 C)   97.8 F (36.6 C)  TempSrc: Oral   Oral  SpO2: 96% 94%  96%  Weight:      Height:       General exam: Conversant, in no acute distress Respiratory system: normal chest rise, clear, no audible wheezing Cardiovascular system: regular rhythm, s1-s2 Gastrointestinal system: Nondistended, nontender, pos BS Central nervous system: No seizures, no tremors Extremities: No cyanosis, no joint deformities Skin: No rashes, no pallor Psychiatry: Affect normal // no auditory hallucinations   Data Reviewed:  Labs reviewed: Na 129, K 3.9, Cr 0.55, WBC 4.7, Hgb 8.3   Family Communication: Pt in room, family not at bedside  Disposition: Status is: Inpatient Remains inpatient appropriate because: Severity of illness  Planned Discharge Destination: Home    Author: Marylu Lund, MD 08/05/2022 4:10 PM  For on call review [email or web address].

## 2022-08-05 NOTE — Progress Notes (Signed)
Physical Therapy Treatment Patient Details Name: [name] MRN: [id number] DOB: Jan 20, 1981 Today's Date: 08/05/2022   History of Present Illness Pt is a 42 y.o. male admitted 07/17/22 with SOB, cough; workup for influenza PNA. Pt with worsening respiratory status and AMS; transfer to ICU. Chest CTA with chronic PE, innumerable pulmonary masses bilaterally; suspect widespread pulmonary metastatic disease. Acute pericarditis 12/29. ETT 12/28-12/30. S/p R calf mass biopsy 1/2. PMH includes RLE mass and DVT, tooth infection.    PT Comments    Patient received in bed, he is agreeable to PT session. He reports he is doing okay. Gilford Rile has been delivered to his room. He is mod I with bed mobility and transfers and ambulated ~300 feet with RW. Supervision. He is limited by fatigue. Did not have any vomiting this session, however is concerned about it requesting emesis bag at end of session. Patient will continue to benefit from skilled PT to improve endurance and independence.        Recommendations for follow up therapy are one component of a multi-disciplinary discharge planning process, led by the attending physician.  Recommendations may be updated based on patient status, additional functional criteria and insurance authorization.  Follow Up Recommendations  Outpatient PT     Assistance Recommended at Discharge Intermittent Supervision/Assistance  Patient can return home with the following Assistance with cooking/housework;Assist for transportation;Direct supervision/assist for medications management;Direct supervision/assist for financial management;Help with stairs or ramp for entrance   Equipment Recommendations  None recommended by PT (rolling walker delivered to room)    Recommendations for Other Services       Precautions / Restrictions Precautions Precaution Comments: watch HR; known RLE swelling (awaiting biopsy results) Restrictions Weight Bearing Restrictions: No      Mobility  Bed Mobility Overal bed mobility: Independent Bed Mobility: Supine to Sit     Supine to sit: Independent     General bed mobility comments: No assist needed.    Transfers Overall transfer level: Modified independent Equipment used: Rolling walker (2 wheels) Transfers: Sit to/from Stand Sit to Stand: Modified independent (Device/Increase time)           General transfer comment: Able to come to stand without assistance or LOB    Ambulation/Gait Ambulation/Gait assistance: Supervision Gait Distance (Feet): 300 Feet Assistive device: Rolling walker (2 wheels) Gait Pattern/deviations: Step-through pattern Gait velocity: WFL     General Gait Details: some unsteadiness noted, impulsive at times. Ambulated without lob using RW. Limited by fatigue/sob.   Stairs             Wheelchair Mobility    Modified Rankin (Stroke Patients Only)       Balance Overall balance assessment: Modified Independent Sitting-balance support: Feet supported Sitting balance-Leahy Scale: Normal     Standing balance support: Bilateral upper extremity supported, During functional activity Standing balance-Leahy Scale: Good Standing balance comment: Supervision with use of RW ( used due to fatigue)                            Cognition Arousal/Alertness: Awake/alert Behavior During Therapy: WFL for tasks assessed/performed Overall Cognitive Status: Within Functional Limits for tasks assessed Area of Impairment: Awareness                         Safety/Judgement: Decreased awareness of deficits              Exercises      General  Comments        Pertinent Vitals/Pain Pain Assessment Pain Assessment: No/denies pain    Home Living                          Prior Function            PT Goals (current goals can now be found in the care plan section) Acute Rehab PT Goals Patient Stated Goal: to improve PT Goal  Formulation: With patient Time For Goal Achievement: 08/19/22 Potential to Achieve Goals: Good Progress towards PT goals: Progressing toward goals    Frequency    Min 2X/week      PT Plan Current plan remains appropriate    Co-evaluation              AM-PAC PT "6 Clicks" Mobility   Outcome Measure  Help needed turning from your back to your side while in a flat bed without using bedrails?: None Help needed moving from lying on your back to sitting on the side of a flat bed without using bedrails?: None Help needed moving to and from a bed to a chair (including a wheelchair)?: None Help needed standing up from a chair using your arms (e.g., wheelchair or bedside chair)?: None Help needed to walk in hospital room?: A Little Help needed climbing 3-5 steps with a railing? : A Little 6 Click Score: 22    End of Session Equipment Utilized During Treatment: Gait belt Activity Tolerance: Patient tolerated treatment well Patient left: in bed;with call bell/phone within reach Nurse Communication: Mobility status PT Visit Diagnosis: Other abnormalities of gait and mobility (R26.89);Muscle weakness (generalized) (M62.81)     Time: 2637-8588 PT Time Calculation (min) (ACUTE ONLY): 17 min  Charges:  $Gait Training: 8-22 mins                     Parag Dorton, PT, GCS 08/05/22,11:59 AM

## 2022-08-05 NOTE — Progress Notes (Signed)
Nutrition Follow-up  DOCUMENTATION CODES:   Non-severe (moderate) malnutrition in context of chronic illness  INTERVENTION:   Continue Multivitamin w/ minerals daily Encourage good PO intake  Discontinue Ensure Enlive po BID, each supplement provides 350 kcal and 20 grams of protein.  NUTRITION DIAGNOSIS:   Moderate Malnutrition (in the context of chronic illness) related to poor appetite as evidenced by mild fat depletion, mild muscle depletion. - Ongoing   GOAL:   Patient will meet greater than or equal to 90% of their needs - Ongoing  MONITOR:   PO intake, Skin, Labs, I & O's  REASON FOR ASSESSMENT:   Consult Enteral/tube feeding initiation and management  ASSESSMENT:   Pt with hx of leg mass (likely malignant but pt did not follow-up after dx), DVT, and alcohol abuse presented to ED with several days of SOB and cough. Febrile and hypoxic in ED. Imaging showed cirrhosis, pneumonia and innumerable pulmonary masses consistent with widespread metastatic disease. Pt also found to be Flu+.  12/28 - Intubated 12/30 - Extubated  01/02 - s/p biopsy of R calf mass; diet advanced to regular 01/12 - RSV positive  Pt sitting on edge of bed at time of RD visit, ordering dinner. Pt reports that he just has not had much of an appetite over the past few days. Discussed understanding appetite is not great when we do not feel well. Encouraged nutritional supplements in times like this, he shared that the Ensure is too sweet. Ongoing nausea and vomiting. Pt inquired about how much fluid to take in, reviewed what is considered a fluid and estimate of how much to take in. Shares that last week his appetite was great and he was eating well. RD encouraged pt to have family or friends bring in food or snacks if that will help his PO intake.  Meal Intake 01/05-01/08: 100% x 7 meals  01/09-01/15: 0-100% x 8 melas (average 61%)  Discussed with RN. RN shares that pt did not eat breakfast this  morning.   Medications reviewed and include: Folic Acid, NovoLog SSI, MVI, Protonix, Thiamine Labs reviewed: Sodium 129, BUN <5, Creatinine 0.55, 24 hr CBGs 107-163  UOP: 4300 mL x 24 hours   Diet Order:   Diet Order             Diet regular Room service appropriate? Yes; Fluid consistency: Thin  Diet effective now                  EDUCATION NEEDS:   No education needs have been identified at this time  Skin:  Skin Assessment: Reviewed RN Assessment  Last BM:  1/14  Height:  Ht Readings from Last 1 Encounters:  07/17/22 '6\' 3"'$  (1.905 m)   Weight:  Wt Readings from Last 1 Encounters:  08/05/22 87.5 kg   Ideal Body Weight:  89.1 kg  BMI:  Body mass index is 24.12 kg/m.  Estimated Nutritional Needs:  Kcal:  2500-2700 kcal/d Protein:  125-140g/d Fluid:  2.5-2.7L/d    Hermina Barters RD, LDN Clinical Dietitian See Shea Evans for contact information.

## 2022-08-06 ENCOUNTER — Encounter (HOSPITAL_COMMUNITY): Payer: Self-pay

## 2022-08-06 DIAGNOSIS — C7800 Secondary malignant neoplasm of unspecified lung: Secondary | ICD-10-CM | POA: Diagnosis not present

## 2022-08-06 DIAGNOSIS — C7802 Secondary malignant neoplasm of left lung: Secondary | ICD-10-CM | POA: Diagnosis not present

## 2022-08-06 DIAGNOSIS — J121 Respiratory syncytial virus pneumonia: Secondary | ICD-10-CM | POA: Insufficient documentation

## 2022-08-06 DIAGNOSIS — J13 Pneumonia due to Streptococcus pneumoniae: Secondary | ICD-10-CM | POA: Insufficient documentation

## 2022-08-06 DIAGNOSIS — C4921 Malignant neoplasm of connective and soft tissue of right lower limb, including hip: Secondary | ICD-10-CM

## 2022-08-06 DIAGNOSIS — C7801 Secondary malignant neoplasm of right lung: Secondary | ICD-10-CM | POA: Diagnosis not present

## 2022-08-06 DIAGNOSIS — J9601 Acute respiratory failure with hypoxia: Secondary | ICD-10-CM | POA: Diagnosis not present

## 2022-08-06 DIAGNOSIS — A419 Sepsis, unspecified organism: Secondary | ICD-10-CM | POA: Diagnosis not present

## 2022-08-06 LAB — COMPREHENSIVE METABOLIC PANEL
ALT: 16 U/L (ref 0–44)
AST: 27 U/L (ref 15–41)
Albumin: 2.7 g/dL — ABNORMAL LOW (ref 3.5–5.0)
Alkaline Phosphatase: 87 U/L (ref 38–126)
Anion gap: 6 (ref 5–15)
BUN: 5 mg/dL — ABNORMAL LOW (ref 6–20)
CO2: 25 mmol/L (ref 22–32)
Calcium: 10.6 mg/dL — ABNORMAL HIGH (ref 8.9–10.3)
Chloride: 101 mmol/L (ref 98–111)
Creatinine, Ser: 0.79 mg/dL (ref 0.61–1.24)
GFR, Estimated: >60 mL/min (ref >60)
Glucose, Bld: 108 mg/dL — ABNORMAL HIGH (ref 70–99)
Potassium: 3.8 mmol/L (ref 3.5–5.1)
Sodium: 132 mmol/L — ABNORMAL LOW (ref 135–145)
Total Bilirubin: 0.6 mg/dL (ref 0.3–1.2)
Total Protein: 7.6 g/dL (ref 6.5–8.1)

## 2022-08-06 LAB — GLUCOSE, CAPILLARY
Glucose-Capillary: 88 mg/dL (ref 70–99)
Glucose-Capillary: 104 mg/dL — ABNORMAL HIGH (ref 70–99)
Glucose-Capillary: 125 mg/dL — ABNORMAL HIGH (ref 70–99)
Glucose-Capillary: 112 mg/dL — ABNORMAL HIGH (ref 70–99)

## 2022-08-06 LAB — CBC
HCT: 28.4 % — ABNORMAL LOW (ref 39.0–52.0)
Hemoglobin: 9.2 g/dL — ABNORMAL LOW (ref 13.0–17.0)
MCH: 34.3 pg — ABNORMAL HIGH (ref 26.0–34.0)
MCHC: 32.4 g/dL (ref 30.0–36.0)
MCV: 106 fL — ABNORMAL HIGH (ref 80.0–100.0)
Platelets: 260 10*3/uL (ref 150–400)
RBC: 2.68 MIL/uL — ABNORMAL LOW (ref 4.22–5.81)
RDW: 13.5 % (ref 11.5–15.5)
WBC: 5.6 10*3/uL (ref 4.0–10.5)
nRBC: 0 % (ref 0.0–0.2)

## 2022-08-06 LAB — SURGICAL PATHOLOGY

## 2022-08-06 NOTE — Progress Notes (Addendum)
[facility] Progress Note  Patient: [name]     WNU:[id number]  DOA: 07/16/2022   PCP: Patient, No Pcp Per       Brief hospital course: This is a 42 year old male with a history of right leg swelling secondary to an underlying mass, right leg DVT and PE and alcoholism. The patient presented to the hospital for shortness of breath and cough and was diagnosed with multiple lung masses which appear to be metastatic, influenza and pneumonia.  Intubated urgently on 12/28.  Later noted to have urine Streptococcus antigen positive and therefore felt to have streptococcal pneumonia versus cross-reactivity.   He was extubated on 12/30 and weaned to room air. The patient developed new cough and fevers and was found to be positive for RSV which was also associated with nausea and vomiting.  He developed acute pericarditis on 12/29 felt to be secondary to his viral illnesses and was treated with high-dose steroids and then transition to colchicine. The patient underwent a biopsy of his right leg mass on 1/2 and for unclear reasons, the results are still pending. 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  Feels that his right leg has become even more swollen.  Assessment and Plan: Principal Problem:   Acute respiratory failure (HCC) -Ventilator dependent respiratory failure secondary to influenza pneumonia and possibly streptococcal pneumonia? 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 -Initially found in July 2023 and was referred to [facility] but he states he never followed up -  Biopsy of the right leg mass performed on 1/2 is still pending - The patient states that he will not remain in the area once he is released from the hospital as he has lost his job and has nowhere to stay -he will be traveling to [place] to reside with his sister and will need to obtain follow-up with oncology there - If the biopsy does not result, it will need to be followed as outpatient  Acute pericarditis - Secondary to influenza A and currently on colchicine with plan to complete a 1 month course    Liver cirrhosis/alcoholism - The patient states that he was drinking heavily at least 5 days a week but has not drank in about a month    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 - Diagnosed in July 2023-he states he took about 6 to 8 weeks of Eliquis "until it ran out"  Hypomagnesemia and hypokalemia - Replacing as needed  Disposition: Patient is jobless and essentially homeless at the [facility] will need to be discharged on Lovenox, colchicine when he is ready     Code Status: Full Code Consultants: Pulmonary critical care, interventional radiology Level of Care: Level of care: Med-Surg Total time on patient care: 40 minutes DVT prophylaxis: Lovenox  Objective:   Vitals:   08/06/22 0627 08/06/22 0731 08/06/22 0809 08/06/22 1142  BP:  131/81  109/70  Pulse:  (!) 104 (!) 103 (!) 101  Resp:  20 19 16   Temp:  98 F (36.7 C)  (!) 97.3 F (36.3 C)  TempSrc:  Oral  Oral  SpO2:  96% 98% 98%  Weight: 87.9 kg  Height:       Filed Weights   08/04/22 0409 08/05/22 0444 08/06/22 0627  Weight: 88.9 kg 87.5 kg 87.9 kg   Exam: General exam: Appears comfortable  HEENT: oral mucosa moist Respiratory system: b/l rhonchi Cardiovascular system: S1 & S2 heard  Gastrointestinal system: Abdomen soft, non-tender, nondistended. Normal bowel sounds   Extremities:    Psychiatry:  Mood & affect appropriate.      CBC: Recent Labs  Lab 08/02/22 0057  08/03/22 0120 08/04/22 0112 08/05/22 0129 08/06/22 0145  WBC 11.2* 6.9 4.4 4.7 5.6  HGB 9.7* 9.2* 9.0* 8.3* 9.2*  HCT 29.0* 27.7* 27.3* 25.2* 28.4*  MCV 108.2* 107.4* 107.9* 107.7* 106.0*  PLT 221 209 201 216 546   Basic Metabolic Panel: Recent Labs  Lab 08/02/22 0057 08/03/22 0120 08/04/22 0112 08/05/22 0129 08/06/22 0145  NA 132* 130* 130* 129* 132*  K 4.0 4.0 3.8 3.9 3.8  CL 100 98 99 102 101  CO2 25 24 25 22 25   GLUCOSE 106* 93 119* 91 108*  BUN 5* 5* 5* <5* 5*  CREATININE 0.73 0.79 0.72 0.55* 0.79  CALCIUM 10.6* 10.5* 10.0 9.5 10.6*  MG  --  1.5* 1.9  --   --    GFR: Estimated Creatinine Clearance: 145.2 mL/min (by C-G formula based on SCr of 0.79 mg/dL).  Scheduled Meds:  aspirin  81 mg Oral Daily   budesonide (PULMICORT) nebulizer solution  0.25 mg Nebulization BID   colchicine  0.6 mg Oral BID   enoxaparin (LOVENOX) injection  80 mg Subcutaneous E70J   folic acid  1 mg Oral Daily   insulin aspart  0-9 Units Subcutaneous TID AC & HS   metoprolol tartrate  37.5 mg Oral BID   multivitamin with minerals  1 tablet Oral Daily   pantoprazole  40 mg Oral Daily   thiamine  100 mg Oral Daily   Or   thiamine  100 mg Intravenous Daily   Continuous Infusions:  sodium chloride Stopped (07/26/22 1300)   Imaging and lab data was personally reviewed No results found.  LOS: 20 days   Author: Debbe Odea  08/06/2022 2:12 PM  To contact [facility]>   Check the care team in [facility] and look for the attending/consulting [facility] provider listed  Log into [email or web address] and use [employer]'s universal password   Go to> "[facility]"  and find provider  If you still have difficulty reaching the provider, please page the [facility] (Director on Call) for the Hospitalists listed on amion

## 2022-08-06 NOTE — Consult Note (Signed)
[place] [phone]   [phone]  CONSULT NOTE  REFERRING PHYSICIAN: Dr. Debbe Odea  REASON FOR CONSULTATION:  42 years old African-American male recently diagnosed with metastatic synovial sarcoma  HPI [name] is a 42 y.o. male with no significant past medical history but long history of alcohol abuse.  The patient mentions that for the last 6 months he has been noticing swelling of his right lower extremity that has been getting worse over the last several months.  He was admitted to the hospital on July 17, 2022 complaining of shortness of breath and cough.  He was found to have influenza and pneumonia and has been treated with Tamiflu as well as broad-spectrum antibiotics at that time.  He was intubated at some point during this course of hospitalization.  He had CT angiogram of the chest on July 17, 2022 that showed innumerable pulmonary masses bilaterally most in keeping with widespread pulmonary metastatic disease.  There was also superimposed tree-in-bud nodularity throughout the right lung with consolidation within the right middle lobe and bronchial wall thickening and bronchiectasis.  There was intraluminal filling defect within the right superior pulmonary vein which may represent intraluminal thrombus or intravascular extension of tumor.  She Opdualag the abdomen pelvis showed no evidence of primary malignancy or metastatic disease in the abdomen or pelvis.  On July 22, 2022 the patient underwent ultrasound-guided core biopsy of the right calf mass by interventional radiology.  The ultrasound also showed markedly enlarged with scattered soft tissue nodules and masses throughout the medial aspect of the right calf. The final pathology ([id number] ) finally became available today and it showed poorly differentiated synovial sarcoma based on outside consultation with Dr. Pat Kocher at [facility]. The findings  are characteristic of poorly differentiated synovial sarcoma.  The tumor is composed of sheets of short spindled and rounded cells with variably prominent nuclei and scant cytoplasm.  There are prominent thin-walled dilated vessels.  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  By immunohistochemistry performed in my laboratory, the tumor cells are diffusely positive with the SS18-SSX fusion-specific antibody, whereas CD34 and STAT6 are negative.  The SS18-SSX antibody correlates with the underlying pathognomonic gene rearrangement and is specific for synovial sarcoma.  Monophasic synovial sarcomas with a round cell component are designated poorly differentiated; such tumors are more aggressive than conventional synovial sarcomas  I was consulted to see the patient today for evaluation and recommendation regarding his condition. Seen today he is feeling fine with no concerning complaints except for the shortness of breath with exertion.  He also has significant swelling of the right lower extremity below the knee.  He denied having any chest pain but has cough with no hemoptysis.  He has no nausea, vomiting, diarrhea or constipation.  He has no headache or visual changes. Family history is unremarkable for any malignancy. The patient is single and has 2 daughters age 52 and 87.  He is originally from [place] of [place] and he still have family there and would like to go back to [place] for his care.  He quit his job 6 months ago because he could not work with the swelling in his legs.  He has no history for smoking but drinks on daily basis.  He has no history of drug abuse.   HPI  Past Medical History:  Diagnosis Date   Epistaxis    Tooth infection  Past Surgical History:  Procedure Laterality Date   arm surgery      Family History  Problem Relation Age of Onset   Heart failure Neg Hx    Heart attack Neg Hx    Fainting Neg Hx      Social History Social History   Tobacco Use   Smoking status: Never   Smokeless tobacco: Never  Substance Use Topics   Alcohol use: Yes    Comment: 5th liquor daily   Drug use: No    No Known Allergies  Current Facility-Administered Medications  Medication Dose Route Frequency Provider Last Rate Last Admin   0.9 %  sodium chloride infusion  250 mL Intravenous Continuous Frederik Pear, MD   Stopped at 07/26/22 1300   acetaminophen (TYLENOL) tablet 650 mg  650 mg Oral Q6H PRN Mariel Aloe, MD   650 mg at 08/05/22 1650   aspirin chewable tablet 81 mg  81 mg Oral Daily Heath Lark D, DO   81 mg at 08/06/22 3664   benzonatate (TESSALON) capsule 100 mg  100 mg Oral TID PRN Donne Hazel, MD   100 mg at 08/06/22 1455   budesonide (PULMICORT) nebulizer solution 0.25 mg  0.25 mg Nebulization BID Dana Allan I, MD   0.25 mg at 08/06/22 0809   colchicine tablet 0.6 mg  0.6 mg Oral BID Manuella Ghazi, Pratik D, DO   0.6 mg at 08/06/22 0833   enoxaparin (LOVENOX) injection 80 mg  80 mg Subcutaneous Q12H Donne Hazel, MD   80 mg at 40/34/74 2595   folic acid (FOLVITE) tablet 1 mg  1 mg Oral Daily Manuella Ghazi, Pratik D, DO   1 mg at 08/06/22 0832   guaiFENesin-dextromethorphan (ROBITUSSIN DM) 100-10 MG/5ML syrup 5 mL  5 mL Oral Q4H PRN Donne Hazel, MD   5 mL at 08/06/22 1455   insulin aspart (novoLOG) injection 0-9 Units  0-9 Units Subcutaneous TID AC & HS Frederik Pear, MD   2 Units at 08/05/22 2145   ipratropium-albuterol (DUONEB) 0.5-2.5 (3) MG/3ML nebulizer solution 3 mL  3 mL Nebulization Q4H PRN Eulogio Bear U, DO       metoprolol tartrate (LOPRESSOR) tablet 37.5 mg  37.5 mg Oral BID Donne Hazel, MD   37.5 mg at 08/06/22 6387   multivitamin with minerals tablet 1 tablet  1 tablet Oral Daily Heath Lark D, DO   1 tablet at 08/06/22 5643   Oral care mouth rinse  15 mL Mouth Rinse PRN Juanito Doom, MD       pantoprazole (PROTONIX) EC tablet 40 mg  40 mg Oral Daily Donne Hazel, MD   40 mg at 08/06/22 3295   polyethylene glycol (MIRALAX / GLYCOLAX) packet 17 g  17 g Per Tube Daily PRN Wilson Singer I, RPH       thiamine (VITAMIN B1) tablet 100 mg  100 mg Oral Daily Manuella Ghazi, Pratik D, DO   100 mg at 08/06/22 1884   Or   thiamine (VITAMIN B1) injection 100 mg  100 mg Intravenous Daily Manuella Ghazi, Pratik D, DO        Review of Systems  Constitutional: positive for fatigue and weight loss Eyes: negative Ears, nose, mouth, throat, and face: negative Respiratory: positive for cough and dyspnea on exertion Cardiovascular: negative Gastrointestinal: negative Genitourinary:negative Integument/breast: negative Hematologic/lymphatic: negative Musculoskeletal:positive for arthralgias and bone pain Neurological: negative Behavioral/Psych: negative Endocrine: negative Allergic/Immunologic: negative  Physical Exam  ZYS:AYTKZ, healthy, no  distress, well nourished, and well developed SKIN: skin color, texture, turgor are normal, no rashes or significant lesions HEAD: Normocephalic, No masses, lesions, tenderness or abnormalities EYES: normal, PERRLA, Conjunctiva are pink and non-injected EARS: External ears normal, Canals clear OROPHARYNX:no exudate, no erythema, and lips, buccal mucosa, and tongue normal  NECK: supple, no adenopathy, no JVD LYMPH:  no palpable lymphadenopathy, no hepatosplenomegaly LUNGS: clear to auscultation , and palpation HEART: regular rate & rhythm, no murmurs, and no gallops ABDOMEN:abdomen soft, non-tender, normal bowel sounds, and no masses or organomegaly BACK: Back symmetric, no curvature., No CVA tenderness EXTREMITIES:no joint deformities, effusion, or inflammation, no edema  NEURO: alert & oriented x 3 with fluent speech, no focal motor/sensory deficits     PERFORMANCE STATUS: ECOG 1  LABORATORY DATA: Lab Results  Component Value Date   WBC 5.6 08/06/2022   HGB 9.2 (L) 08/06/2022   HCT 28.4 (L) 08/06/2022   MCV 106.0  (H) 08/06/2022   PLT 260 08/06/2022    @LASTCHEM @  RADIOGRAPHIC STUDIES: CT CHEST ABDOMEN PELVIS W CONTRAST  Result Date: 08/02/2022 CLINICAL DATA:  Follow-up widespread pulmonary metastases. A large right calf soft tissue mass was biopsied on 07/22/2022 with pending pathology at this time. Inpatient. * Tracking Code: BO * EXAM: CT CHEST, ABDOMEN, AND PELVIS WITH CONTRAST TECHNIQUE: Multidetector CT imaging of the chest, abdomen and pelvis was performed following the standard protocol during bolus administration of intravenous contrast. RADIATION DOSE REDUCTION: This exam was performed according to the departmental dose-optimization program which includes automated exposure control, adjustment of the mA and/or kV according to patient size and/or use of iterative reconstruction technique. CONTRAST:  49m OMNIPAQUE IOHEXOL 350 MG/ML SOLN COMPARISON:  07/17/2022 chest CT angiogram. 07/18/2022 CT abdomen/pelvis. FINDINGS: CT CHEST FINDINGS Cardiovascular: Normal heart size. No significant pericardial effusion/thickening. Great vessels are normal in course and caliber. No acute central pulmonary emboli. Mediastinum/Nodes: No significant thyroid nodules. Unremarkable esophagus. No axillary adenopathy. Stable mildly enlarged 1.0 cm lower right paratracheal node (series 3/image 25). No additional pathologically enlarged mediastinal nodes. No discrete hilar adenopathy. Lungs/Pleura: No pneumothorax. No pleural effusion. Innumerable (> 30) solid bilateral lung masses and pulmonary nodules, mildly increased since 07/17/2022 CT. Representative 8.4 cm peripheral left upper lobe lung mass (series 4/image 60), previously 7.9 cm. Representative 5.5 cm right perihilar lung mass (series 4/image 69), increased from 3.7 cm. Representative 3.9 cm medial right lower lobe lung mass (series 4/image 119), increased from 3.6 cm. Musculoskeletal: No aggressive appearing focal osseous lesions. Superficial subcutaneous 1.4 cm lesion in  the back just to the left of midline (series 3/image 26), unchanged, potentially a sebaceous cyst. CT ABDOMEN PELVIS FINDINGS Hepatobiliary: Diffusely irregular liver surface compatible with cirrhosis. No liver masses. Normal gallbladder with no radiopaque cholelithiasis. No biliary ductal dilatation. Pancreas: Normal, with no mass or duct dilation. Spleen: Normal size. No mass. Adrenals/Urinary Tract: Normal adrenals. No contour deforming renal masses. No hydronephrosis. Normal bladder. Stomach/Bowel: Normal non-distended stomach. Normal caliber small bowel with no small bowel wall thickening. Normal appendix. Normal large bowel with no diverticulosis, large bowel wall thickening or pericolonic fat stranding. Oral contrast transits to the left colon. Vascular/Lymphatic: Normal caliber abdominal aorta. Patent portal, splenic, hepatic and renal veins. No pathologically enlarged lymph nodes in the abdomen or pelvis. Reproductive: Normal size prostate. Other: No pneumoperitoneum, ascites or focal fluid collection. Small fat containing umbilical hernia. Subcutaneous fat stranding and gas in the bilateral ventral abdominal wall is largely new from 07/18/2022 CT, most compatible with inflammatory change from  subcutaneous injection of medications. Musculoskeletal: No aggressive appearing focal osseous lesions. IMPRESSION: 1. Innumerable solid bilateral cannonball pulmonary metastases, progressive since recent 07/17/2022 CT. 2. Stable mild mediastinal lymphadenopathy. 3. No evidence of metastatic disease in the abdomen or pelvis. 4. Cirrhosis. No liver masses. Normal size spleen. Electronically Signed   By: Ilona Sorrel M.D.   On: 08/02/2022 21:33   DG Abd 1 View  Result Date: 07/31/2022 CLINICAL DATA:  Fever, nausea and vomiting. EXAM: ABDOMEN - 1 VIEW COMPARISON:  Abdomen and pelvis CT dated 07/18/2022 FINDINGS: The liver remains enlarged. Normal bowel gas pattern. Mild upper lumbar spine degenerative changes.  IMPRESSION: 1. No acute abnormality. 2. Hepatomegaly. Electronically Signed   By: Claudie Revering M.D.   On: 07/31/2022 18:29   DG CHEST PORT 1 VIEW  Result Date: 07/31/2022 CLINICAL DATA:  Fever, nausea and vomiting. EXAM: PORTABLE CHEST 1 VIEW COMPARISON:  07/29/2022. FINDINGS: The heart remains normal in size. Large number of masses and nodules in both lungs of varying sizes, without significant change. No interval airspace opacity or pleural fluid. Unremarkable bones. IMPRESSION: 1. Stable extensive bilateral lung metastases. 2. No interval abnormality. Electronically Signed   By: Claudie Revering M.D.   On: 07/31/2022 18:28   DG CHEST PORT 1 VIEW  Result Date: 07/29/2022 CLINICAL DATA:  Cough. EXAM: PORTABLE CHEST 1 VIEW COMPARISON:  July 26, 2022. FINDINGS: The heart size and mediastinal contours are within normal limits. Stable appearance of bilateral pulmonary nodules are noted, with the largest noted in the left midlung, consistent with metastatic disease. The visualized skeletal structures are unremarkable. IMPRESSION: Stable bilateral pulmonary nodules are noted consistent with metastatic disease. Electronically Signed   By: Marijo Conception M.D.   On: 07/29/2022 10:54   DG CHEST PORT 1 VIEW  Result Date: 07/26/2022 CLINICAL DATA:  Pneumonia, shortness of breath, pleural effusion EXAM: PORTABLE CHEST 1 VIEW COMPARISON:  07/17/2022 FINDINGS: Heart size is normal. Numerous pulmonary masses and nodules are again seen throughout the lung. Improved aeration of the right lung, without acute appearing airspace opacity. Interval endotracheal and esophagogastric extubation. Osseous structures unremarkable. IMPRESSION: 1. Numerous pulmonary masses and nodules are again seen throughout the lung. Improved aeration of the right lung, without acute appearing airspace opacity. 2. Interval endotracheal and esophagogastric extubation. Electronically Signed   By: Delanna Ahmadi M.D.   On: 07/26/2022 11:09   [place] CORE  BIOPSY (SOFT TISSUE)  Result Date: 07/22/2022 INDICATION: 42 year old with a very large soft tissue mass involving the right calf. Evidence for extensive metastatic disease in the chest. Tissue diagnosis is needed. EXAM: ULTRASOUND-GUIDED BIOPSY OF RIGHT CALF MASS MEDICATIONS: Moderate sedation ANESTHESIA/SEDATION: Moderate (conscious) sedation was employed during this procedure. A total of Versed 1 mg and Fentanyl 75 mcg was administered intravenously by the radiology nurse. Total intra-service moderate Sedation Time: 21 minutes. The patient's level of consciousness and vital signs were monitored continuously by radiology nursing throughout the procedure under my direct supervision. FLUOROSCOPY TIME:  None COMPLICATIONS: None immediate. PROCEDURE: Informed written consent was obtained from the patient after a thorough discussion of the procedural risks, benefits and alternatives. All questions were addressed.A timeout was performed prior to the initiation of the procedure. The right calf was evaluated with ultrasound. There is a large soft tissue mass along the posterior lower aspect of the calf but the skin is very hard and tight in this area. Smaller soft tissue component was targeted in the medial mid calf because the overlying skin was more normal. Skin  was prepped with chlorhexidine and sterile field was created. Skin was anesthetized using 1% lidocaine. Using ultrasound guidance, 17 gauge coaxial needle was directed into the soft tissue mass and multiple core biopsies were obtained with an 18 gauge core device. Specimens placed in formalin. 17 gauge coaxial needle was removed without complication. Bandage placed over the puncture site. FINDINGS: Right calf is markedly enlarged with scattered soft tissue nodules and masses throughout the medial aspect of the right calf. Core biopsy needle was confirmed within the soft tissue mass. IMPRESSION: Ultrasound-guided core biopsy of right calf soft tissue mass.  Electronically Signed   By: Markus Daft M.D.   On: 07/22/2022 17:40   CT ABDOMEN PELVIS WO CONTRAST  Result Date: 07/18/2022 CLINICAL DATA:  Innumerable bilateral pulmonary masses compatible with widespread pulmonary metastatic disease on the chest CTA obtained yesterday. The current study is to evaluate for a possible primary source and additional metastatic disease. EXAM: CT ABDOMEN AND PELVIS WITHOUT CONTRAST TECHNIQUE: Multidetector CT imaging of the abdomen and pelvis was performed following the standard protocol without IV contrast. RADIATION DOSE REDUCTION: This exam was performed according to the departmental dose-optimization program which includes automated exposure control, adjustment of the mA and/or kV according to patient size and/or use of iterative reconstruction technique. COMPARISON:  None Available. FINDINGS: Lower chest: A large number of bilateral pulmonary masses and nodules are again demonstrated. Interval dense left lower lobe consolidation. Slightly more confluent tree in bud opacities and patchy opacities associated with cylindrical bronchiectasis at the right lung base. Streak artifacts from the patient's arms with no definite pleural fluid seen. Hepatobiliary: Previously noted changes of cirrhosis of the liver. Pancreas: Unremarkable. No pancreatic ductal dilatation or surrounding inflammatory changes. Spleen: Normal in size without focal abnormality. Adrenals/Urinary Tract: Foley catheter in the urinary bladder with associated air in the bladder. Mild-to-moderate diffuse bladder wall thickening. Unremarkable adrenal glands, kidneys and ureters. Stomach/Bowel: Nasogastric tube in the stomach. There is some oral contrast in the stomach as well as well as in normal caliber small bowel loops. Minimal sigmoid colon diverticulosis. Otherwise, unremarkable colon and normal-appearing appendix. Vascular/Lymphatic: No significant vascular findings are present. No enlarged abdominal or pelvic  lymph nodes. Reproductive: Unremarkable prostate gland. Other: Small to moderate-sized umbilical hernia containing fat. Musculoskeletal: Minimal lumbar spine degenerative changes. Bilateral L5 pars interarticularis defects with associated minimal grade 1 anterolisthesis at the L5-S1 level. No evidence of bony metastatic disease. IMPRESSION: 1. Interval dense left lower lobe consolidation, compatible with dense atelectasis or pneumonia. 2. Slightly more confluent tree in bud opacities and patchy opacities associated with cylindrical bronchiectasis at the right lung base compatible with a mildly progressive infectious/inflammatory process. 3. Extensive bilateral pulmonary metastases. 4. No evidence of primary malignancy or metastatic disease in the abdomen or pelvis. 5. Stable changes of cirrhosis of the liver. 6. Small to moderate-sized umbilical hernia containing fat. 7. 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 Electronically Signed   By: Claudie Revering M.D.   On: 07/18/2022 23:24   ECHOCARDIOGRAM COMPLETE  Result Date: 07/18/2022    ECHOCARDIOGRAM REPORT   Patient Name:   Jancarlos Thrun Date of Exam: 07/18/2022 Medical Rec #:  [id number]     Height:       75.0 in Accession #:    [id number]    Weight:       201.1 lb Date of Birth:  10/07/1980     BSA:          2.199  m Patient Age:    41 years      BP:           117/102 mmHg Patient Gender: M             HR:           77 bpm. Exam Location:  Inpatient Procedure: 2D Echo, Cardiac Doppler and Color Doppler                         STAT ECHO Reported to: Dr Skeet Latch on 07/18/2022 8:02:00 AM. Indications:    Abnormal ECG  History:        Patient has no prior history of Echocardiogram examinations.                 Signs/Symptoms:Shortness of Breath. Hx DVT. ETOH abuse.  Sonographer:    Clayton Lefort RDCS (AE) Referring Phys: Cassie Freer O'NEAL  Sonographer Comments: Echo performed with patient supine and on  artificial respirator. Dr. Gwynneth Macleod O'Neal at bedside during echo. IMPRESSIONS  1. Left ventricular ejection fraction, by estimation, is 50 to 55%. The left ventricle has low normal function. The left ventricle has no regional wall motion abnormalities. Left ventricular diastolic parameters were normal.  2. Right ventricular systolic function is low normal. The right ventricular size is normal. There is normal pulmonary artery systolic pressure.  3. The mitral valve is normal in structure. No evidence of mitral valve regurgitation. No evidence of mitral stenosis.  4. The aortic valve is tricuspid. Aortic valve regurgitation is not visualized. No aortic stenosis is present.  5. The inferior vena cava is normal in size with greater than 50% respiratory variability, suggesting right atrial pressure of 3 mmHg. FINDINGS  Left Ventricle: Left ventricular ejection fraction, by estimation, is 50 to 55%. The left ventricle has low normal function. The left ventricle has no regional wall motion abnormalities. The left ventricular internal cavity size was normal in size. There is no left ventricular hypertrophy. Left ventricular diastolic parameters were normal. Right Ventricle: The right ventricular size is normal. No increase in right ventricular wall thickness. Right ventricular systolic function is low normal. There is normal pulmonary artery systolic pressure. The tricuspid regurgitant velocity is 1.14 m/s,  and with an assumed right atrial pressure of 15 mmHg, the estimated right ventricular systolic pressure is 32.9 mmHg. Left Atrium: Left atrial size was normal in size. Right Atrium: Right atrial size was normal in size. Pericardium: There is no evidence of pericardial effusion. Mitral Valve: The mitral valve is normal in structure. No evidence of mitral valve regurgitation. No evidence of mitral valve stenosis. Tricuspid Valve: The tricuspid valve is normal in structure. Tricuspid valve regurgitation is trivial. No  evidence of tricuspid stenosis. Aortic Valve: The aortic valve is tricuspid. Aortic valve regurgitation is not visualized. No aortic stenosis is present. Aortic valve mean gradient measures 3.0 mmHg. Aortic valve peak gradient measures 5.2 mmHg. Aortic valve area, by VTI measures 3.39 cm. Pulmonic Valve: The pulmonic valve was normal in structure. Pulmonic valve regurgitation is not visualized. No evidence of pulmonic stenosis. Aorta: The aortic root is normal in size and structure. Venous: IVC assessment for right atrial pressure unable to be performed due to mechanical ventilation. The inferior vena cava is normal in size with greater than 50% respiratory variability, suggesting right atrial pressure of 3 mmHg. IAS/Shunts: No atrial level shunt detected by color flow Doppler.  LEFT VENTRICLE PLAX 2D LVIDd:  5.80 cm      Diastology LVIDs:         4.40 cm      LV e' medial:    10.70 cm/s LV PW:         0.90 cm      LV E/e' medial:  7.2 LV IVS:        0.80 cm      LV e' lateral:   16.50 cm/s LVOT diam:     2.30 cm      LV E/e' lateral: 4.7 LV SV:         73 LV SV Index:   33 LVOT Area:     4.15 cm  LV Volumes (MOD) LV vol d, MOD A2C: 104.0 ml LV vol d, MOD A4C: 119.0 ml LV vol s, MOD A2C: 57.4 ml LV vol s, MOD A4C: 56.3 ml LV SV MOD A2C:     46.6 ml LV SV MOD A4C:     119.0 ml LV SV MOD BP:      52.7 ml RIGHT VENTRICLE             IVC RV Basal diam:  2.50 cm     IVC diam: 2.80 cm RV S prime:     12.30 cm/s TAPSE (M-mode): 1.9 cm LEFT ATRIUM             Index        RIGHT ATRIUM           Index LA diam:        3.40 cm 1.55 cm/m   RA Area:     12.30 cm LA Vol (A2C):   28.9 ml 13.14 ml/m  RA Volume:   28.00 ml  12.73 ml/m LA Vol (A4C):   32.9 ml 14.96 ml/m LA Biplane Vol: 30.8 ml 14.00 ml/m  AORTIC VALVE AV Area (Vmax):    3.64 cm AV Area (Vmean):   3.41 cm AV Area (VTI):     3.39 cm AV Vmax:           114.00 cm/s AV Vmean:          75.400 cm/s AV VTI:            0.216 m AV Peak Grad:      5.2 mmHg AV  Mean Grad:      3.0 mmHg LVOT Vmax:         99.80 cm/s LVOT Vmean:        61.900 cm/s LVOT VTI:          0.176 m LVOT/AV VTI ratio: 0.81  AORTA Ao Root diam: 3.40 cm Ao Asc diam:  3.40 cm MITRAL VALVE               TRICUSPID VALVE MV Area (PHT): 2.63 cm    TR Peak grad:   5.2 mmHg MV Decel Time: 288 msec    TR Vmax:        114.00 cm/s MV E velocity: 77.10 cm/s MV A velocity: 38.90 cm/s  SHUNTS MV E/A ratio:  1.98        Systemic VTI:  0.18 m                            Systemic Diam: 2.30 cm Skeet Latch MD Electronically signed by Skeet Latch MD Signature Date/Time: 07/18/2022/8:16:15 AM    Final    DG Chest Portable 1 View  Result Date:  07/17/2022 CLINICAL DATA:  Intubation. Multiple pulmonary masses/nodules on recent chest CT suspicious for metastatic disease. EXAM: PORTABLE CHEST 1 VIEW COMPARISON:  Radiographs 07/17/2022 and 07/16/2022. CT 07/17/2022. Right lower leg MRI 02/03/2022. FINDINGS: 1528 hours. Interval intubation. Tip of the endotracheal tube is in the mid trachea. Nasogastric tube projects below the diaphragm, tip not visualized. The heart size and mediastinal contours are grossly stable. As seen on recent studies, there are multiple pulmonary nodules bilaterally with a large mass peripherally in the left upper lobe. There are patchy airspace opacities throughout the right lung which appears slightly worse. The right costophrenic angle is incompletely visualized, but no large pleural effusion or pneumothorax is identified. The bones appear unremarkable. IMPRESSION: 1. Satisfactory position of the endotracheal and nasogastric tubes. 2. Bilateral pulmonary nodules and left upper lobe mass most consistent with metastatic disease. 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 3. Slight worsening of patchy airspace opacities throughout the right lung suspicious for superimposed pneumonia or hemorrhage. Electronically  Signed   By: Richardean Sale M.D.   On: 07/17/2022 15:44   DG Chest Portable 1 View  Result Date: 07/17/2022 CLINICAL DATA:  Shortness of breath. EXAM: PORTABLE CHEST 1 VIEW COMPARISON:  Chest radiograph 1 day prior, same day CTA chest FINDINGS: The cardiomediastinal silhouette is stable. Multiple pulmonary masses are again seen, the largest measuring up to 9.8 cm in the left lung. Right middle lobe consolidation is also unchanged. There is no new or worsening focal airspace disease compared to the radiograph from 1 day prior. There is no pleural effusion or pneumothorax There is no acute osseous abnormality. IMPRESSION: Multiple pulmonary masses and right middle lobe consolidation are unchanged. No significant interval change since the radiograph from 1 day prior or same day CTA chest. Electronically Signed   By: Valetta Mole M.D.   On: 07/17/2022 13:09   CT Angio Chest PE W and/or Wo Contrast  Result Date: 07/17/2022 CLINICAL DATA:  Pulmonary embolism. EXAM: CT ANGIOGRAPHY CHEST WITH CONTRAST TECHNIQUE: Multidetector CT imaging of the chest was performed using the standard protocol during bolus administration of intravenous contrast. Multiplanar CT image reconstructions and MIPs were obtained to evaluate the vascular anatomy. RADIATION DOSE REDUCTION: This exam was performed according to the departmental dose-optimization program which includes automated exposure control, adjustment of the mA and/or kV according to patient size and/or use of iterative reconstruction technique. CONTRAST:  50m OMNIPAQUE IOHEXOL 350 MG/ML SOLN COMPARISON:  None Available. FINDINGS: Cardiovascular: There is adequate opacification of the pulmonary arterial tree. There are multiple synechia and eccentric filling defects within the right middle and lower lobar segmental pulmonary arteries in keeping with chronic pulmonary embolism. No intraluminal filling defect identified to suggest acute pulmonary emboli. The central  pulmonary arteries are mildly enlarged in keeping with changes of pulmonary arterial hypertension. Global cardiac size within normal limits. No pericardial effusion. The thoracic aorta is unremarkable. There is an intraluminal filling defect within the right superior pulmonary vein which may represent intraluminal thrombus or intravascular extension of tumor. Mediastinum/Nodes: Visualized thyroid is unremarkable. No pathologic thoracic adenopathy. The esophagus is unremarkable. Lungs/Pleura: There are innumerable pulmonary masses seen bilaterally most in keeping with widespread pulmonary metastatic disease. Dominant mass within the left upper lobe measures 7.9 cm in greatest dimension. There is superimposed tree-in-bud nodularity throughout the right lung, consolidation within the right middle lobe, and bronchial wall thickening and bronchiolectasis best appreciated within the right lower lobe.  These findings can be seen the setting of superimposed acute multilobar infection or, less likely, endobronchial extension of disease. No pneumothorax or pleural effusion. Layering debris is seen within the distal trachea. Upper Abdomen: The liver contour is nodular in keeping with underlying cirrhosis. No acute abnormality. Musculoskeletal: No acute bone abnormality. No lytic or blastic bone lesion. Review of the MIP images confirms the above findings. IMPRESSION: 1. 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 No acute pulmonary emboli identified. 2. Innumerable pulmonary masses bilaterally most in keeping with widespread pulmonary metastatic disease. 3. Superimposed tree-in-bud nodularity throughout the right lung, consolidation within the right middle lobe, and bronchial wall thickening and bronchiolectasis best appreciated within the right lower lobe. These findings can be seen the setting of superimposed acute multilobar infection or, less likely, endobronchial  extension of disease. 4. Intraluminal filling defect within the right superior pulmonary vein which may represent intraluminal thrombus or intravascular extension of tumor. 5. Morphologic changes in keeping with underlying pulmonary arterial hypertension. 6. Cirrhosis. Electronically Signed   By: Fidela Salisbury M.D.   On: 07/17/2022 01:16   DG Chest 2 View  Result Date: 07/16/2022 CLINICAL DATA:  Shortness of breath EXAM: CHEST - 2 VIEW COMPARISON:  04/21/2014 FINDINGS: Stable cardiomediastinal silhouette. Numerous bilateral nodular opacities measuring up to 2.8 cm on the right. Large subpleural mass measuring approximately 9.4 cm in the left midthorax. Hazy airspace opacity in the right lower lung. No pleural effusion or pneumothorax. No acute osseous abnormality. IMPRESSION: Large left mid lung subpleural mass. Numerous bilateral pulmonary nodules. Findings are concerning for metastases. Hazy opacity in the right lower lobe is nonspecific and may be neoplastic or infectious. These findings would be better evaluated with CT chest with IV contrast. Electronically Signed   By: Placido Sou M.D.   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   PLAN: I had a lengthy discussion with the patient today about his current disease stage, prognosis and treatment options. I personally and independently reviewed the scan images and discussed the result and showed the images to the patient today. Unfortunately he has a very poor prognosis. I explained to the patient that he has incurable condition and all the treatment will be of palliative nature. I gave the patient the option of palliative care and hospice referral versus consideration of palliative systemic chemotherapy with doxorubicin, ifosfamide and mesna  which usually done in the inpatient setting versus treatment with tyrosine kinase inhibitor like Pazopanib which is an oral drug but usually preferred as second line option after the intravenous systemic chemotherapy. The patient mentioned that he would like to go back to [place] close to his family for treatment.  I think this is a great idea for him to be close to the family and also hopefully to see a sarcoma specialist for this rare type of the tumor at the [facility] in [place]. Names includes Dr. Horris Latino, Dr. Calla Kicks, Dr. Jerene Pitch among others, Phone [phone]) If the patient decided to stay in [facility] and he would prefer to have a local treatment, I will be happy to arrange for him to receive his treatment at [facility] initially with doxorubicin, ifosfamide and mesna.   The patient voices understanding of current disease status and treatment options and  is in agreement with the current care plan.  All questions were answered. The patient knows to call the clinic with any problems, questions or concerns. We can certainly see the patient much sooner if necessary.  Thank you so much for allowing me to participate in the care of [name]. I will continue to follow up the patient with you and assist in his care.   Disclaimer: This note was dictated with voice recognition software. Similar sounding words can inadvertently be transcribed and may not be corrected upon review.   Eilleen Kempf August 06, 2022, 3:51 PM

## 2022-08-06 NOTE — Progress Notes (Signed)
Report called to [id number]

## 2022-08-07 ENCOUNTER — Other Ambulatory Visit (HOSPITAL_COMMUNITY): Payer: Self-pay

## 2022-08-07 DIAGNOSIS — A419 Sepsis, unspecified organism: Secondary | ICD-10-CM | POA: Diagnosis not present

## 2022-08-07 DIAGNOSIS — F1010 Alcohol abuse, uncomplicated: Secondary | ICD-10-CM | POA: Insufficient documentation

## 2022-08-07 DIAGNOSIS — R531 Weakness: Secondary | ICD-10-CM

## 2022-08-07 DIAGNOSIS — I301 Infective pericarditis: Secondary | ICD-10-CM | POA: Insufficient documentation

## 2022-08-07 DIAGNOSIS — J189 Pneumonia, unspecified organism: Secondary | ICD-10-CM | POA: Diagnosis not present

## 2022-08-07 DIAGNOSIS — J9601 Acute respiratory failure with hypoxia: Secondary | ICD-10-CM | POA: Diagnosis not present

## 2022-08-07 DIAGNOSIS — D539 Nutritional anemia, unspecified: Secondary | ICD-10-CM | POA: Insufficient documentation

## 2022-08-07 DIAGNOSIS — Z515 Encounter for palliative care: Secondary | ICD-10-CM | POA: Diagnosis not present

## 2022-08-07 DIAGNOSIS — C499 Malignant neoplasm of connective and soft tissue, unspecified: Secondary | ICD-10-CM | POA: Diagnosis not present

## 2022-08-07 DIAGNOSIS — C7801 Secondary malignant neoplasm of right lung: Secondary | ICD-10-CM | POA: Diagnosis not present

## 2022-08-07 LAB — GLUCOSE, CAPILLARY
Glucose-Capillary: 83 mg/dL (ref 70–99)
Glucose-Capillary: 105 mg/dL — ABNORMAL HIGH (ref 70–99)
Glucose-Capillary: 108 mg/dL — ABNORMAL HIGH (ref 70–99)
Glucose-Capillary: 109 mg/dL — ABNORMAL HIGH (ref 70–99)
Glucose-Capillary: 116 mg/dL — ABNORMAL HIGH (ref 70–99)

## 2022-08-07 MED ORDER — ADULT MULTIVITAMIN W/MINERALS CH
1.0000 | ORAL_TABLET | Freq: Every day | ORAL | 0 refills | Status: AC
  Filled 2022-08-07: qty 30, 30d supply, fill #0

## 2022-08-07 MED ORDER — GUAIFENESIN-DM 100-10 MG/5ML PO SYRP
5.0000 mL | ORAL_SOLUTION | ORAL | 0 refills | Status: AC | PRN
  Filled 2022-08-07: qty 118, 4d supply, fill #0

## 2022-08-07 MED ORDER — BENZONATATE 100 MG PO CAPS
100.0000 mg | ORAL_CAPSULE | Freq: Three times a day (TID) | ORAL | 0 refills | Status: AC | PRN
  Filled 2022-08-07: qty 20, 7d supply, fill #0

## 2022-08-07 MED ORDER — THIAMINE HCL 100 MG PO TABS
100.0000 mg | ORAL_TABLET | Freq: Every day | ORAL | 0 refills | Status: AC
  Filled 2022-08-07: qty 30, 30d supply, fill #0

## 2022-08-07 MED ORDER — ACETAMINOPHEN 325 MG PO TABS: 650.0000 mg | ORAL_TABLET | Freq: Four times a day (QID) | ORAL | Status: AC | PRN

## 2022-08-07 MED ORDER — COLCHICINE 0.6 MG PO TABS
0.6000 mg | ORAL_TABLET | Freq: Two times a day (BID) | ORAL | 0 refills | Status: AC
  Filled 2022-08-07: qty 22, 11d supply, fill #0

## 2022-08-07 MED ORDER — PANTOPRAZOLE SODIUM 40 MG PO TBEC
40.0000 mg | DELAYED_RELEASE_TABLET | Freq: Every day | ORAL | 0 refills | Status: AC
  Filled 2022-08-07: qty 30, 30d supply, fill #0

## 2022-08-07 MED ORDER — ENOXAPARIN SODIUM 80 MG/0.8ML IJ SOSY
80.0000 mg | PREFILLED_SYRINGE | Freq: Two times a day (BID) | INTRAMUSCULAR | 0 refills | Status: AC
  Filled 2022-08-07: qty 48, 30d supply, fill #0

## 2022-08-07 MED ORDER — FOLIC ACID 1 MG PO TABS
1.0000 mg | ORAL_TABLET | Freq: Every day | ORAL | 0 refills | Status: AC
  Filled 2022-08-07: qty 30, 30d supply, fill #0

## 2022-08-07 MED ORDER — METOPROLOL TARTRATE 25 MG PO TABS
37.5000 mg | ORAL_TABLET | Freq: Two times a day (BID) | ORAL | 0 refills | Status: AC
  Filled 2022-08-07: qty 90, 30d supply, fill #0

## 2022-08-07 NOTE — Consult Note (Signed)
Consultation Note Date: 08/07/2022   Patient Name: [name]  DOB: 1981/04/29  MRN: [id number]  Age / Sex: 42 y.o., male  PCP: Patient, No Pcp Per Referring Physician: Debbe Odea, MD  Reason for Consultation: Establishing goals of care and Psychosocial/spiritual support  HPI/Patient Profile: 42 y.o. male   admitted on 07/16/2022 with   past medical history of alcohol misuse, and 6 months of bilateral right lower extremity edema  He was admitted to the hospital on July 17, 2022 complaining of shortness of breath and cough.  He was found to have influenza and pneumonia and has been treated with Tamiflu as well as broad-spectrum antibiotics at that time.  He was intubated at some point during this course of hospitalization.    He had CT angiogram of the chest on July 17, 2022 that showed innumerable pulmonary masses bilaterally most in keeping with widespread pulmonary metastatic disease.   On July 22, 2022 the patient underwent ultrasound-guided core biopsy of the right calf mass by interventional radiology.  Final biopsy only secured yesterday significant for poorly differentiated synovial sarcoma  Dr Mohammed/oncology consulted on Mr. Weatherford yesterday.  Per his note he shared the associated very poor prognosis.  His cancer is incurable and all treatment would be palliative in nature.  Patient faces treatment option decision, advanced directive decisions and anticipatory care needs.  Clinical Assessment and Goals of Care:   This NP Wadie Lessen reviewed medical records, received report from team, assessed the patient and then meet at the patient's bedside  to discuss diagnosis, prognosis, GOC, EOL wishes disposition and options.   Concept of Palliative Care was introduced as specialized medical care for people and their families living with serious illness.  If focuses on providing relief  from the symptoms and stress of a serious illness.  The goal is to improve quality of life for both the patient and the family.  Values and goals of care important to patient and family were attempted to be elicited.  Created space and opportunity for patient  to explore thoughts and feelings regarding current medical situation.    Patient verbalizes feelings of feeling overwhelmed.  His support system in [place] is very limited, he no longer has housing or employment.  This all seems to be coming at him ""very fast".  He has a strong support with family in [place].  He is leaning towards discharge from hospital, driving home to [place] with his brother who is currently here supporting him.   A  discussion was had today regarding advanced directives.  Concept specific to code status,  was had.  Education offered on the importance of securing H POA and advance care planning.  The difference between a aggressive medical intervention path  and a palliative comfort care path for this patient at this time was had.       Questions and concerns addressed.  Patient  encouraged to call with questions or concerns.     PMT will continue to support holistically.  No documented healthcare power of attorney or advanced care planning documents.  Patient does state that he would like for his sister/ Gegia Seidl to be his spokesperson in the event that he could not speak for himself.  His sister lives in [place] Planning: Full code   Palliative Prophylaxis:  Bowel Regimen, Frequent Pain Assessment, and Oral Care  Additional Recommendations (Limitations, Scope, Preferences): Full Scope Treatment  Psycho-social/Spiritual:  Desire for further Chaplaincy support:no-declined   Prognosis:  Unable to determine  Discharge Planning:   Patient is verbalizing desire and plan to discharge tomorrow and drive home to [place] with his  brother who is currently here in [place]..  Dr. Julien Nordmann offered outpatient treatment at the [facility] or if patient wishes initiating care in [place] once he returns home  Detailed discussion/education offered on the importance of follow-up once patient returns to his home and family in [place]..  Dr. Earlie Server clearly outlines recommendation for [facility] in [place] offering various physician names and contact information (See his note)  To Be Determined      Primary Diagnoses: Present on Admission:  Hyponatremia  Chronic pulmonary embolism ([place])  Acute respiratory failure ([place])   I have reviewed the medical record, interviewed the patient and family, and examined the patient. The following aspects are pertinent.  Past Medical History:  Diagnosis Date   Epistaxis    Tooth infection    Social History   Socioeconomic History   Marital status: Single    Spouse name: Not on file   Number of children: Not on file   Years of education: Not on file   Highest education level: Not on file  Occupational History   Not on file  Tobacco Use   Smoking status: Never   Smokeless tobacco: Never  Substance and Sexual Activity   Alcohol use: Yes    Comment: 5th liquor daily   Drug use: No   Sexual activity: Not on file  Other Topics Concern   Not on file  Social History Narrative   Not on file   Social Determinants of Health   Financial Resource Strain: Not on file  Food Insecurity: No Food Insecurity (07/26/2022)   Hunger Vital Sign    Worried About Running Out of Food in the Last Year: Never true    Ran Out of Food in the Last Year: Never true  Transportation Needs: No Transportation Needs (07/26/2022)   PRAPARE - Hydrologist (Medical): No    Lack of Transportation (Non-Medical): No  Physical Activity: Not on file  Stress: Not on file  Social Connections: Not on file   Family History  Problem Relation Age of Onset   Heart  failure Neg Hx    Heart attack Neg Hx    Fainting Neg Hx    Scheduled Meds:  aspirin  81 mg Oral Daily   budesonide (PULMICORT) nebulizer solution  0.25 mg Nebulization BID   colchicine  0.6 mg Oral BID   enoxaparin (LOVENOX) injection  80 mg Subcutaneous N82N   folic acid  1 mg Oral Daily   insulin aspart  0-9 Units Subcutaneous TID AC & HS   metoprolol tartrate  37.5 mg Oral BID   multivitamin with minerals  1 tablet Oral Daily   pantoprazole  40 mg Oral Daily   thiamine  100 mg Oral Daily   Or   thiamine  100 mg Intravenous Daily   Continuous Infusions:  sodium chloride Stopped (07/26/22 1300)   PRN Meds:.acetaminophen, benzonatate, guaiFENesin-dextromethorphan, ipratropium-albuterol, mouth rinse, polyethylene glycol Medications Prior to Admission:  Prior to Admission medications   Medication Sig Start Date End Date Taking? Authorizing Provider  acetaminophen (TYLENOL) 500 MG tablet Take 1,000 mg by mouth every 6 (six) hours as needed for moderate pain.   Yes [provider]  ibuprofen (ADVIL) 200 MG tablet Take 200 mg by mouth every 6 (six) hours as needed for mild pain.   Yes [provider]  APIXABAN (ELIQUIS) VTE STARTER PACK (10MG AND 5MG) Take as directed on package: start with two-74m tablets twice daily for 7 days. On day 8, switch to one-582mtablet twice daily. Patient not taking: Reported on 07/18/2022 02/03/22   GoSherwood GamblerMD   No Known Allergies Review of Systems  Respiratory:  Positive for cough and shortness of breath.   Neurological:  Positive for weakness.    Physical Exam Cardiovascular:     Rate and Rhythm: Normal rate.  Pulmonary:     Effort: Pulmonary effort is normal.     Breath sounds: Rhonchi present.  Musculoskeletal:     Right lower leg: Edema present.  Skin:    General: Skin is warm and dry.  Neurological:     Mental Status: He is alert and oriented to person, place, and time.     Vital Signs: BP 100/88   Pulse  (!) 104   Temp 98.5 F (36.9 C) (Oral)   Resp 16   Ht 6' 3"  (1.905 m)   Wt 87.9 kg   SpO2 99%   BMI 24.22 kg/m  Pain Scale: 0-10 POSS *See Group Information*: 1-Acceptable,Awake and alert Pain Score: 0-No pain   SpO2: SpO2: 99 % O2 Device:SpO2: 99 % O2 Flow Rate: .O2 Flow Rate (L/min): 2 L/min  IO: Intake/output summary:  Intake/Output Summary (Last 24 hours) at 08/07/2022 0901 Last data filed at 08/06/2022 2130 Gross per 24 hour  Intake 237 ml  Output --  Net 237 ml    LBM: Last BM Date : 08/05/22 Baseline Weight: Weight: 91.9 kg Most recent weight: Weight: 87.9 kg     Palliative Assessment/Data:70 %    Discussed ith Dr RiWynelle ClevelandTime In: 0930 Time Out:1045  Time Total: 75 minutes Greater than 50%  of this time was spent counseling and coordinating care related to the above assessment and plan.  Signed by: MaWadie LessenNP   Please contact Palliative Medicine Team phone at [phone] questions and concerns.  For individual provider: See AmShea Evans

## 2022-08-07 NOTE — Progress Notes (Signed)
Physical Therapy Treatment Patient Details Name: [name] MRN: [id number] DOB: 03/22/1981 Today's Date: 08/07/2022   History of Present Illness Pt is a 42 y.o. male admitted 07/17/22 with SOB, cough; workup for influenza PNA. Pt with worsening respiratory status and AMS; transfer to ICU. Chest CTA with chronic PE, innumerable pulmonary masses bilaterally; suspect widespread pulmonary metastatic disease. Acute pericarditis 12/29. ETT 12/28-12/30. S/p R calf mass biopsy 1/2. PMH includes RLE mass and DVT, tooth infection.    PT Comments    Pt admitted with above diagnosis. Pt was able to ambulate in hallway with RW with good safety with challenges.  Pt did not want to try to walk without RW today as he states he feels safer with the RW.   Pt does express desire to walk without the RW.  Will continue to progress pt as able. Pt currently with functional limitations due to balance and endurance deficits. Pt will benefit from skilled PT to increase their independence and safety with mobility to allow discharge to the venue listed below.      Recommendations for follow up therapy are one component of a multi-disciplinary discharge planning process, led by the attending physician.  Recommendations may be updated based on patient status, additional functional criteria and insurance authorization.  Follow Up Recommendations  Outpatient PT     Assistance Recommended at Discharge Intermittent Supervision/Assistance  Patient can return home with the following Assistance with cooking/housework;Assist for transportation;Direct supervision/assist for medications management;Direct supervision/assist for financial management;Help with stairs or ramp for entrance   Equipment Recommendations  None recommended by PT (rolling walker delivered to room)    Recommendations for Other Services       Precautions / Restrictions Precautions Precautions: Other (comment) Precaution Comments: known RLE swelling  (awaiting biopsy results) Restrictions Weight Bearing Restrictions: No     Mobility  Bed Mobility Overal bed mobility: Independent Bed Mobility: Supine to Sit Rolling: Independent Sidelying to sit: Supervision, HOB elevated Supine to sit: Independent     General bed mobility comments: No assist needed.    Transfers Overall transfer level: Modified independent Equipment used: Rolling walker (2 wheels) Transfers: Sit to/from Stand Sit to Stand: Modified independent (Device/Increase time)           General transfer comment: Able to come to stand without assistance or LOB    Ambulation/Gait Ambulation/Gait assistance: Supervision Gait Distance (Feet): 550 Feet Assistive device: Rolling walker (2 wheels) Gait Pattern/deviations: Step-through pattern Gait velocity: WFL Gait velocity interpretation: 1.31 - 2.62 ft/sec, indicative of limited community ambulator   General Gait Details: No unsteadiness and decr  impulsivity as well. Ambulated without lob using RW. Limited by fatigue/sob.  Pt initially stated he wanted to try to walk without the RW but then changed his mind.   Stairs             Wheelchair Mobility    Modified Rankin (Stroke Patients Only)       Balance Overall balance assessment: Modified Independent Sitting-balance support: Feet supported Sitting balance-Leahy Scale: Normal Sitting balance - Comments: reliant on UE's or external support.  Quick to fatigue   Standing balance support: Bilateral upper extremity supported, During functional activity Standing balance-Leahy Scale: Good Standing balance comment: Supervision with use of RW ( used due to fatigue)                            Cognition Arousal/Alertness: Awake/alert Behavior During Therapy: WFL for tasks assessed/performed Overall  Cognitive Status: Within Functional Limits for tasks assessed Area of Impairment: Awareness                 Orientation Level: Time,  Situation Current Attention Level: Selective       Awareness: Emergent            Exercises      General Comments General comments (skin integrity, edema, etc.): VSS      Pertinent Vitals/Pain Pain Assessment Pain Assessment: No/denies pain    Home Living                          Prior Function            PT Goals (current goals can now be found in the care plan section) Acute Rehab PT Goals Patient Stated Goal: to improve PT Goal Formulation: With patient Time For Goal Achievement: 08/19/22 Potential to Achieve Goals: Good Progress towards PT goals: Progressing toward goals    Frequency    Min 2X/week      PT Plan Current plan remains appropriate    Co-evaluation              AM-PAC PT "6 Clicks" Mobility   Outcome Measure  Help needed turning from your back to your side while in a flat bed without using bedrails?: None Help needed moving from lying on your back to sitting on the side of a flat bed without using bedrails?: None Help needed moving to and from a bed to a chair (including a wheelchair)?: None Help needed standing up from a chair using your arms (e.g., wheelchair or bedside chair)?: None Help needed to walk in hospital room?: A Little Help needed climbing 3-5 steps with a railing? : A Little 6 Click Score: 22    End of Session Equipment Utilized During Treatment: Gait belt Activity Tolerance: Patient tolerated treatment well Patient left: in bed;with call bell/phone within reach;with family/visitor present (seated EOB) Nurse Communication: Mobility status PT Visit Diagnosis: Other abnormalities of gait and mobility (R26.89);Muscle weakness (generalized) (M62.81)     Time: 7169-6789 PT Time Calculation (min) (ACUTE ONLY): 15 min  Charges:  $Gait Training: 8-22 mins                     Vela Render M,PT Acute Rehab Services [phone]    Alvira Philips 08/07/2022, 12:23 PM

## 2022-08-07 NOTE — Discharge Summary (Signed)
Physician Discharge Summary  [name] ZOX:[id number] DOB: Aug 12, 1980 DOA: 07/16/2022  PCP: Patient, No Pcp Per  Admit date: 07/16/2022 Discharge date: 08/07/2022 Discharging to: Home Recommendations for Outpatient Follow-up:  Will need to follow-up with oncology as soon as possible  Consults:  Pulmonary critical care Interventional radiology Oncology Palliative care     Discharge Diagnoses:   Principal Problem:   Acute respiratory failure ([place]) Active Problems:   Hyponatremia   Chronic pulmonary embolism (HCC)   Liver cirrhosis (HCC)   DVT (deep venous thrombosis) (HCC)   Malnutrition of moderate degree   RSV (respiratory syncytial virus pneumonia)   Streptococcus pneumoniae pneumonia (HCC)   Synovial sarcoma, 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 The patient presented to the hospital for shortness of breath and cough that had been present for at least 3 days.  In triage, pulse ox was 89%, heart rate was 141 and there was yellow discharge from his nose.  He was placed on a nonrebreather and continued to be tachypneic. On a CTA of the chest, he was found to have innumerable pulmonary masses which appear to be metastatic with a dominant left upper lobe mass of 7.9 cm, consolidation throughout the right lung with bronchial wall thickening and bronchiectasis best appreciated in the right lower lobe, (chronic) pulmonary emboli in the right superior pulmonary vein and changes consistent with pulmonary arterial hypertension.  Of note, liver was noted to be cirrhotic. He was also diagnosed with influenza A. Vancomycin, Zosyn and IV fluids were administered for sepsis.  His work of breathing increased On 12/28, he was admitted to the  hospitalist service but then, due to decline in respiratory status, was intubated and transferred to the ICU. Urine streptococcal antigen was positive. On 12/29:  he was also diagnosed with acute pericarditis felt to be secondary to his underlying viral infection.  This was treated with IV steroids and later colchicine.  A 2D echo did not reveal any pericardial effusion. On 12/30, he was extubated. On 1/2, he underwent a biopsy of the right leg mass by interventional radiology.  The biopsy was sent to an outside lab for consultation and therefore did not result until 1/17. On 1/6, the patient developed new cough and fevers and was found to be positive for RSV which was also associated with nausea and vomiting.  Improvement was slow. 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  Oncology and palliative care was consulted.  The patient stated that as he was now jobless and homeless, he would be transitioning to [place] where he could be closer to his siblings.  At this point the plan is for him to follow-up at the [facility] clinic to determine options for his aggressive cancer.  He is being discharged with Lovenox due to his history of DVT/PE and colchicine to complete a total of a 1 month course.    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 Julien Nordmann, oncology recommends follow-up with Dr. Horris Latino, Dr. Kerry Kass, Dr. Tawanna Solo or any of their colleagues at the [facility] clinic in [place] #[id number]. If the patient chooses to stay in [place], he can follow-up with Dr. Earlie Server whose information is attached-treatment plan would be doxorubicin,, ifosfamide and mesna.   Acute pericarditis - Secondary to influenza A and currently on colchicine with plan to complete a 1 month course -2D echo 12/29: EF 50 to 55%, no regional wall motion abnormalities, normal diastolic parameters, RV systolic function low normal, normal pulmonary artery systolic pressure, right atrial pressure of 3.    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       Discharge  Instructions  Discharge Instructions     Diet general   Complete by: As directed    Regular diet   Increase activity slowly   Complete by: As directed    No wound care   Complete by: As directed       Allergies as of 08/07/2022   No Known Allergies      Medication List     STOP taking these medications    Apixaban Starter Pack (25m and 544m Commonly known as: ELIQUIS STARTER PACK   ibuprofen 200 MG tablet Commonly known as: ADVIL       TAKE these medications    acetaminophen 325 MG tablet Commonly known as: TYLENOL Take 2 tablets (650 mg total) by mouth every 6 (six) hours as needed (mild pain, fever >100.4). What changed:  medication strength how much to take reasons to take this   benzonatate 100 MG capsule Commonly known as: TESSALON Take 1 capsule (100 mg total) by mouth 3 (three) times daily as needed for cough.   colchicine 0.6 MG tablet Take 1 tablet (0.6 mg total) by mouth 2 (two) times daily for 11 days.   enoxaparin 80 MG/0.8ML injection Commonly known as: LOVENOX Inject 0.8 mLs (80 mg total) into the skin every 12 (twelve) hours.   folic acid 1 MG tablet  Commonly known as: FOLVITE Take 1 tablet (1 mg total) by mouth daily.   guaiFENesin-dextromethorphan 100-10 MG/5ML syrup Commonly known as: ROBITUSSIN DM Take 5 mLs by mouth every 4 (four) hours as needed for cough.   Metoprolol Tartrate 37.5 MG Tabs Take 37.5 mg by mouth 2 (two) times daily.   multivitamin with minerals Tabs tablet Take 1 tablet by mouth daily.   pantoprazole 40 MG tablet Commonly known as: PROTONIX Take 1 tablet (40 mg total) by mouth daily.   thiamine 100 MG tablet Commonly known as: Vitamin B-1 Take 1 tablet (100 mg total) by mouth daily.               Durable Medical Equipment  (From admission, onward)           Start     Ordered   08/01/22 1406  For home use only DME Walker rolling  Once       Question Answer Comment  Walker: With 5 Inch  Wheels   Patient needs a walker to treat with the following condition Weakness generalized      08/01/22 1405                The results of significant diagnostics from this hospitalization (including imaging, microbiology, ancillary and laboratory) are listed below for reference.    CT CHEST ABDOMEN PELVIS W CONTRAST  Result Date: 08/02/2022 CLINICAL DATA:  Follow-up widespread pulmonary metastases. A large right calf soft tissue mass was biopsied on 07/22/2022 with pending pathology at this time. Inpatient. * Tracking Code: BO * EXAM: CT CHEST, ABDOMEN, AND PELVIS WITH CONTRAST TECHNIQUE: Multidetector CT imaging of the chest, abdomen and pelvis was performed following the standard protocol during bolus administration of intravenous contrast. RADIATION DOSE REDUCTION: This exam was performed according to the departmental dose-optimization program which includes automated exposure control, adjustment of the mA and/or kV according to patient size and/or use of iterative reconstruction technique. CONTRAST:  56m OMNIPAQUE IOHEXOL 350 MG/ML SOLN COMPARISON:  07/17/2022 chest CT angiogram. 07/18/2022 CT abdomen/pelvis. FINDINGS: CT CHEST FINDINGS Cardiovascular: Normal heart size. No significant pericardial effusion/thickening. Great vessels are normal in course and caliber. No acute central pulmonary emboli. Mediastinum/Nodes: No significant thyroid nodules. Unremarkable esophagus. No axillary adenopathy. Stable mildly enlarged 1.0 cm lower right paratracheal node (series 3/image 25). No additional pathologically enlarged mediastinal nodes. No discrete hilar adenopathy. Lungs/Pleura: No pneumothorax. No pleural effusion. Innumerable (> 30) solid bilateral lung masses and pulmonary nodules, mildly increased since 07/17/2022 CT. Representative 8.4 cm peripheral left upper lobe lung mass (series 4/image 60), previously 7.9 cm. Representative 5.5 cm right perihilar lung mass (series 4/image 69), increased  from 3.7 cm. Representative 3.9 cm medial right lower lobe lung mass (series 4/image 119), increased from 3.6 cm. Musculoskeletal: No aggressive appearing focal osseous lesions. Superficial subcutaneous 1.4 cm lesion in the back just to the left of midline (series 3/image 26), unchanged, potentially a sebaceous cyst. CT ABDOMEN PELVIS FINDINGS Hepatobiliary: Diffusely irregular liver surface compatible with cirrhosis. No liver masses. Normal gallbladder with no radiopaque cholelithiasis. No biliary ductal dilatation. Pancreas: Normal, with no mass or duct dilation. Spleen: Normal size. No mass. Adrenals/Urinary Tract: Normal adrenals. No contour deforming renal masses. No hydronephrosis. Normal bladder. Stomach/Bowel: Normal non-distended stomach. Normal caliber small bowel with no small bowel wall thickening. Normal appendix. Normal large bowel with no diverticulosis, large bowel wall thickening or pericolonic fat stranding. Oral contrast transits to the left colon. Vascular/Lymphatic: Normal caliber abdominal aorta. Patent portal, splenic,  hepatic and renal veins. No pathologically enlarged lymph nodes in the abdomen or pelvis. Reproductive: Normal size prostate. Other: No pneumoperitoneum, ascites or focal fluid collection. Small fat containing umbilical hernia. Subcutaneous fat stranding and gas in the bilateral ventral abdominal wall is largely new from 07/18/2022 CT, most compatible with inflammatory change from subcutaneous injection of medications. Musculoskeletal: No aggressive appearing focal osseous lesions. IMPRESSION: 1. Innumerable solid bilateral cannonball pulmonary metastases, progressive since recent 07/17/2022 CT. 2. Stable mild mediastinal lymphadenopathy. 3. No evidence of metastatic disease in the abdomen or pelvis. 4. Cirrhosis. No liver masses. Normal size spleen. Electronically Signed   By: Ilona Sorrel M.D.   On: 08/02/2022 21:33   DG Abd 1 View  Result Date: 07/31/2022 CLINICAL DATA:   Fever, nausea and vomiting. EXAM: ABDOMEN - 1 VIEW COMPARISON:  Abdomen and pelvis CT dated 07/18/2022 FINDINGS: The liver remains enlarged. Normal bowel gas pattern. Mild upper lumbar spine degenerative changes. IMPRESSION: 1. No acute abnormality. 2. Hepatomegaly. Electronically Signed   By: Claudie Revering M.D.   On: 07/31/2022 18:29   DG CHEST PORT 1 VIEW  Result Date: 07/31/2022 CLINICAL DATA:  Fever, nausea and vomiting. EXAM: PORTABLE CHEST 1 VIEW COMPARISON:  07/29/2022. FINDINGS: The heart remains normal in size. Large number of masses and nodules in both lungs of varying sizes, without significant change. No interval airspace opacity or pleural fluid. Unremarkable bones. IMPRESSION: 1. Stable extensive bilateral lung metastases. 2. No interval abnormality. Electronically Signed   By: Claudie Revering M.D.   On: 07/31/2022 18:28   DG CHEST PORT 1 VIEW  Result Date: 07/29/2022 CLINICAL DATA:  Cough. EXAM: PORTABLE CHEST 1 VIEW COMPARISON:  July 26, 2022. FINDINGS: The heart size and mediastinal contours are within normal limits. Stable appearance of bilateral pulmonary nodules are noted, with the largest noted in the left midlung, consistent with metastatic disease. The visualized skeletal structures are unremarkable. IMPRESSION: Stable bilateral pulmonary nodules are noted consistent with metastatic disease. Electronically Signed   By: Marijo Conception M.D.   On: 07/29/2022 10:54   DG CHEST PORT 1 VIEW  Result Date: 07/26/2022 CLINICAL DATA:  Pneumonia, shortness of breath, pleural effusion EXAM: PORTABLE CHEST 1 VIEW COMPARISON:  07/17/2022 FINDINGS: Heart size is normal. Numerous pulmonary masses and nodules are again seen throughout the lung. Improved aeration of the right lung, without acute appearing airspace opacity. Interval endotracheal and esophagogastric extubation. Osseous structures unremarkable. IMPRESSION: 1. Numerous pulmonary masses and nodules are again seen throughout the lung.  Improved aeration of the right lung, without acute appearing airspace opacity. 2. Interval endotracheal and esophagogastric extubation. Electronically Signed   By: Delanna Ahmadi M.D.   On: 07/26/2022 11:09   [place] CORE BIOPSY (SOFT TISSUE)  Result Date: 07/22/2022 INDICATION: 42 year old with a very large soft tissue mass involving the right calf. Evidence for extensive metastatic disease in the chest. Tissue diagnosis is needed. EXAM: ULTRASOUND-GUIDED BIOPSY OF RIGHT CALF MASS MEDICATIONS: Moderate sedation ANESTHESIA/SEDATION: Moderate (conscious) sedation was employed during this procedure. A total of Versed 1 mg and Fentanyl 75 mcg was administered intravenously by the radiology nurse. Total intra-service moderate Sedation Time: 21 minutes. The patient's level of consciousness and vital signs were monitored continuously by radiology nursing throughout the procedure under my direct supervision. FLUOROSCOPY TIME:  None COMPLICATIONS: None immediate. PROCEDURE: Informed written consent was obtained from the patient after a thorough discussion of the procedural risks, benefits and alternatives. All questions were addressed.A timeout was performed prior to the initiation of  the procedure. The right calf was evaluated with ultrasound. There is a large soft tissue mass along the posterior lower aspect of the calf but the skin is very hard and tight in this area. Smaller soft tissue component was targeted in the medial mid calf because the overlying skin was more normal. Skin was prepped with chlorhexidine and sterile field was created. Skin was anesthetized using 1% lidocaine. Using ultrasound guidance, 17 gauge coaxial needle was directed into the soft tissue mass and multiple core biopsies were obtained with an 18 gauge core device. Specimens placed in formalin. 17 gauge coaxial needle was removed without complication. Bandage placed over the puncture site. FINDINGS: Right calf is markedly enlarged with scattered  soft tissue nodules and masses throughout the medial aspect of the right calf. Core biopsy needle was confirmed within the soft tissue mass. IMPRESSION: Ultrasound-guided core biopsy of right calf soft tissue mass. Electronically Signed   By: Markus Daft M.D.   On: 07/22/2022 17:40   CT ABDOMEN PELVIS WO CONTRAST  Result Date: 07/18/2022 CLINICAL DATA:  Innumerable bilateral pulmonary masses compatible with widespread pulmonary metastatic disease on the chest CTA obtained yesterday. The current study is to evaluate for a possible primary source and additional metastatic disease. EXAM: CT ABDOMEN AND PELVIS WITHOUT CONTRAST TECHNIQUE: Multidetector CT imaging of the abdomen and pelvis was performed following the standard protocol without IV contrast. RADIATION DOSE REDUCTION: This exam was performed according to the departmental dose-optimization program which includes automated exposure control, adjustment of the mA and/or kV according to patient size and/or use of iterative reconstruction technique. COMPARISON:  None Available. FINDINGS: Lower chest: A large number of bilateral pulmonary masses and nodules are again demonstrated. Interval dense left lower lobe consolidation. Slightly more confluent tree in bud opacities and patchy opacities associated with cylindrical bronchiectasis at the right lung base. Streak artifacts from the patient's arms with no definite pleural fluid seen. Hepatobiliary: Previously noted changes of cirrhosis of the liver. Pancreas: Unremarkable. No pancreatic ductal dilatation or surrounding inflammatory changes. Spleen: Normal in size without focal abnormality. Adrenals/Urinary Tract: Foley catheter in the urinary bladder with associated air in the bladder. Mild-to-moderate diffuse bladder wall thickening. Unremarkable adrenal glands, kidneys and ureters. Stomach/Bowel: Nasogastric tube in the stomach. There is some oral contrast in the stomach as well as well as in normal caliber  small bowel loops. Minimal sigmoid colon diverticulosis. Otherwise, unremarkable colon and normal-appearing appendix. Vascular/Lymphatic: No significant vascular findings are present. No enlarged abdominal or pelvic lymph nodes. Reproductive: Unremarkable prostate gland. Other: Small to moderate-sized umbilical hernia containing fat. Musculoskeletal: Minimal lumbar spine degenerative changes. Bilateral L5 pars interarticularis defects with associated minimal grade 1 anterolisthesis at the L5-S1 level. No evidence of bony metastatic disease. IMPRESSION: 1. Interval dense left lower lobe consolidation, compatible with dense atelectasis or pneumonia. 2. Slightly more confluent tree in bud opacities and patchy opacities associated with cylindrical bronchiectasis at the right lung base compatible with a mildly progressive infectious/inflammatory process. 3. Extensive bilateral pulmonary metastases. 4. No evidence of primary malignancy or metastatic disease in the abdomen or pelvis. 5. Stable changes of cirrhosis of the liver. 6. Small to moderate-sized umbilical hernia containing fat. 7. 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 Electronically Signed   By: Claudie Revering M.D.   On: 07/18/2022 23:24   ECHOCARDIOGRAM COMPLETE  Result Date: 07/18/2022    ECHOCARDIOGRAM REPORT   Patient Name:   Alverto Shedd Date of Exam: 07/18/2022 Medical Rec #:  [id number]     Height:       75.0 in Accession #:    [id number]    Weight:       201.1 lb Date of Birth:  [birth year]     BSA:          2.199 m Patient Age:    41 years      BP:           117/102 mmHg Patient Gender: M             HR:           77 bpm. Exam Location:  Inpatient Procedure: 2D Echo, Cardiac Doppler and Color Doppler                         STAT ECHO Reported to: Dr Skeet Latch on 07/18/2022 8:02:00 AM. Indications:    Abnormal ECG  History:        Patient has no prior history of Echocardiogram  examinations.                 Signs/Symptoms:Shortness of Breath. Hx DVT. ETOH abuse.  Sonographer:    Clayton Lefort RDCS (AE) Referring Phys: Cassie Freer O'NEAL  Sonographer Comments: Echo performed with patient supine and on artificial respirator. Dr. Gwynneth Macleod O'Neal at bedside during echo. IMPRESSIONS  1. Left ventricular ejection fraction, by estimation, is 50 to 55%. The left ventricle has low normal function. The left ventricle has no regional wall motion abnormalities. Left ventricular diastolic parameters were normal.  2. Right ventricular systolic function is low normal. The right ventricular size is normal. There is normal pulmonary artery systolic pressure.  3. The mitral valve is normal in structure. No evidence of mitral valve regurgitation. No evidence of mitral stenosis.  4. The aortic valve is tricuspid. Aortic valve regurgitation is not visualized. No aortic stenosis is present.  5. The inferior vena cava is normal in size with greater than 50% respiratory variability, suggesting right atrial pressure of 3 mmHg. FINDINGS  Left Ventricle: Left ventricular ejection fraction, by estimation, is 50 to 55%. The left ventricle has low normal function. The left ventricle has no regional wall motion abnormalities. The left ventricular internal cavity size was normal in size. There is no left ventricular hypertrophy. Left ventricular diastolic parameters were normal. Right Ventricle: The right ventricular size is normal. No increase in right ventricular wall thickness. Right ventricular systolic function is low normal. There is normal pulmonary artery systolic pressure. The tricuspid regurgitant velocity is 1.14 m/s,  and with an assumed right atrial pressure of 15 mmHg, the estimated right ventricular systolic pressure is 63.0 mmHg. Left Atrium: Left atrial size was normal in size. Right Atrium: Right atrial size was normal in size. Pericardium: There is no evidence of pericardial effusion. Mitral Valve: The  mitral valve is normal in structure. No evidence of mitral valve regurgitation. No evidence of mitral valve stenosis. Tricuspid Valve: The tricuspid valve is normal in structure. Tricuspid valve regurgitation is trivial. No evidence of tricuspid stenosis. Aortic Valve: The aortic valve is tricuspid. Aortic valve regurgitation is not visualized. No aortic stenosis is present. Aortic valve mean gradient measures 3.0 mmHg. Aortic valve peak gradient measures 5.2 mmHg. Aortic valve area, by VTI measures 3.39 cm. Pulmonic Valve: The pulmonic valve was normal in structure. Pulmonic valve regurgitation is not visualized. No evidence of pulmonic stenosis. Aorta: The aortic root is normal in size and structure. Venous: IVC assessment for  right atrial pressure unable to be performed due to mechanical ventilation. The inferior vena cava is normal in size with greater than 50% respiratory variability, suggesting right atrial pressure of 3 mmHg. IAS/Shunts: No atrial level shunt detected by color flow Doppler.  LEFT VENTRICLE PLAX 2D LVIDd:         5.80 cm      Diastology LVIDs:         4.40 cm      LV e' medial:    10.70 cm/s LV PW:         0.90 cm      LV E/e' medial:  7.2 LV IVS:        0.80 cm      LV e' lateral:   16.50 cm/s LVOT diam:     2.30 cm      LV E/e' lateral: 4.7 LV SV:         73 LV SV Index:   33 LVOT Area:     4.15 cm  LV Volumes (MOD) LV vol d, MOD A2C: 104.0 ml LV vol d, MOD A4C: 119.0 ml LV vol s, MOD A2C: 57.4 ml LV vol s, MOD A4C: 56.3 ml LV SV MOD A2C:     46.6 ml LV SV MOD A4C:     119.0 ml LV SV MOD BP:      52.7 ml RIGHT VENTRICLE             IVC RV Basal diam:  2.50 cm     IVC diam: 2.80 cm RV S prime:     12.30 cm/s TAPSE (M-mode): 1.9 cm LEFT ATRIUM             Index        RIGHT ATRIUM           Index LA diam:        3.40 cm 1.55 cm/m   RA Area:     12.30 cm LA Vol (A2C):   28.9 ml 13.14 ml/m  RA Volume:   28.00 ml  12.73 ml/m LA Vol (A4C):   32.9 ml 14.96 ml/m LA Biplane Vol: 30.8 ml 14.00  ml/m  AORTIC VALVE AV Area (Vmax):    3.64 cm AV Area (Vmean):   3.41 cm AV Area (VTI):     3.39 cm AV Vmax:           114.00 cm/s AV Vmean:          75.400 cm/s AV VTI:            0.216 m AV Peak Grad:      5.2 mmHg AV Mean Grad:      3.0 mmHg LVOT Vmax:         99.80 cm/s LVOT Vmean:        61.900 cm/s LVOT VTI:          0.176 m LVOT/AV VTI ratio: 0.81  AORTA Ao Root diam: 3.40 cm Ao Asc diam:  3.40 cm MITRAL VALVE               TRICUSPID VALVE MV Area (PHT): 2.63 cm    TR Peak grad:   5.2 mmHg MV Decel Time: 288 msec    TR Vmax:        114.00 cm/s MV E velocity: 77.10 cm/s MV A velocity: 38.90 cm/s  SHUNTS MV E/A ratio:  1.98        Systemic VTI:  0.18 m  Systemic Diam: 2.30 cm Skeet Latch MD Electronically signed by Skeet Latch MD Signature Date/Time: 07/18/2022/8:16:15 AM    Final    DG Chest Portable 1 View  Result Date: 07/17/2022 CLINICAL DATA:  Intubation. Multiple pulmonary masses/nodules on recent chest CT suspicious for metastatic disease. EXAM: PORTABLE CHEST 1 VIEW COMPARISON:  Radiographs 07/17/2022 and 07/16/2022. CT 07/17/2022. Right lower leg MRI 02/03/2022. FINDINGS: 1528 hours. Interval intubation. Tip of the endotracheal tube is in the mid trachea. Nasogastric tube projects below the diaphragm, tip not visualized. The heart size and mediastinal contours are grossly stable. As seen on recent studies, there are multiple pulmonary nodules bilaterally with a large mass peripherally in the left upper lobe. There are patchy airspace opacities throughout the right lung which appears slightly worse. The right costophrenic angle is incompletely visualized, but no large pleural effusion or pneumothorax is identified. The bones appear unremarkable. IMPRESSION: 1. Satisfactory position of the endotracheal and nasogastric tubes. 2. Bilateral pulmonary nodules and left upper lobe mass most consistent with metastatic disease. 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 3. Slight worsening of patchy airspace opacities throughout the right lung suspicious for superimposed pneumonia or hemorrhage. Electronically Signed   By: Richardean Sale M.D.   On: 07/17/2022 15:44   DG Chest Portable 1 View  Result Date: 07/17/2022 CLINICAL DATA:  Shortness of breath. EXAM: PORTABLE CHEST 1 VIEW COMPARISON:  Chest radiograph 1 day prior, same day CTA chest FINDINGS: The cardiomediastinal silhouette is stable. Multiple pulmonary masses are again seen, the largest measuring up to 9.8 cm in the left lung. Right middle lobe consolidation is also unchanged. There is no new or worsening focal airspace disease compared to the radiograph from 1 day prior. There is no pleural effusion or pneumothorax There is no acute osseous abnormality. IMPRESSION: Multiple pulmonary masses and right middle lobe consolidation are unchanged. No significant interval change since the radiograph from 1 day prior or same day CTA chest. Electronically Signed   By: Valetta Mole M.D.   On: 07/17/2022 13:09   CT Angio Chest PE W and/or Wo Contrast  Result Date: 07/17/2022 CLINICAL DATA:  Pulmonary embolism. EXAM: CT ANGIOGRAPHY CHEST WITH CONTRAST TECHNIQUE: Multidetector CT imaging of the chest was performed using the standard protocol during bolus administration of intravenous contrast. Multiplanar CT image reconstructions and MIPs were obtained to evaluate the vascular anatomy. RADIATION DOSE REDUCTION: This exam was performed according to the departmental dose-optimization program which includes automated exposure control, adjustment of the mA and/or kV according to patient size and/or use of iterative reconstruction technique. CONTRAST:  2m OMNIPAQUE IOHEXOL 350 MG/ML SOLN COMPARISON:  None Available. FINDINGS: Cardiovascular: There is adequate opacification of the pulmonary arterial tree. There are multiple synechia and eccentric  filling defects within the right middle and lower lobar segmental pulmonary arteries in keeping with chronic pulmonary embolism. No intraluminal filling defect identified to suggest acute pulmonary emboli. The central pulmonary arteries are mildly enlarged in keeping with changes of pulmonary arterial hypertension. Global cardiac size within normal limits. No pericardial effusion. The thoracic aorta is unremarkable. There is an intraluminal filling defect within the right superior pulmonary vein which may represent intraluminal thrombus or intravascular extension of tumor. Mediastinum/Nodes: Visualized thyroid is unremarkable. No pathologic thoracic adenopathy. The esophagus is unremarkable. Lungs/Pleura: There are innumerable pulmonary masses seen bilaterally most in keeping with widespread pulmonary metastatic disease. Dominant mass within the left upper lobe measures 7.9  cm in greatest dimension. There is superimposed tree-in-bud nodularity throughout the right lung, consolidation within the right middle lobe, and bronchial wall thickening and bronchiolectasis best appreciated within the right lower lobe. These findings can be seen the setting of superimposed acute multilobar infection or, less likely, endobronchial extension of disease. No pneumothorax or pleural effusion. Layering debris is seen within the distal trachea. Upper Abdomen: The liver contour is nodular in keeping with underlying cirrhosis. No acute abnormality. Musculoskeletal: No acute bone abnormality. No lytic or blastic bone lesion. Review of the MIP images confirms the above findings. IMPRESSION: 1. 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 No acute pulmonary emboli identified. 2. Innumerable pulmonary masses bilaterally most in keeping with widespread pulmonary metastatic disease. 3. Superimposed tree-in-bud nodularity throughout the right lung, consolidation within the right middle lobe,  and bronchial wall thickening and bronchiolectasis best appreciated within the right lower lobe. These findings can be seen the setting of superimposed acute multilobar infection or, less likely, endobronchial extension of disease. 4. Intraluminal filling defect within the right superior pulmonary vein which may represent intraluminal thrombus or intravascular extension of tumor. 5. Morphologic changes in keeping with underlying pulmonary arterial hypertension. 6. Cirrhosis. Electronically Signed   By: Fidela Salisbury M.D.   On: 07/17/2022 01:16   DG Chest 2 View  Result Date: 07/16/2022 CLINICAL DATA:  Shortness of breath EXAM: CHEST - 2 VIEW COMPARISON:  04/21/2014 FINDINGS: Stable cardiomediastinal silhouette. Numerous bilateral nodular opacities measuring up to 2.8 cm on the right. Large subpleural mass measuring approximately 9.4 cm in the left midthorax. Hazy airspace opacity in the right lower lung. No pleural effusion or pneumothorax. No acute osseous abnormality. IMPRESSION: Large left mid lung subpleural mass. Numerous bilateral pulmonary nodules. Findings are concerning for metastases. Hazy opacity in the right lower lobe is nonspecific and may be neoplastic or infectious. These findings would be better evaluated with CT chest with IV contrast. Electronically Signed   By: Placido Sou M.D.   On: 07/16/2022 23:27   Labs:   Basic Metabolic Panel: Recent Labs  Lab 08/02/22 0057 08/03/22 0120 08/04/22 0112 08/05/22 0129 08/06/22 0145  NA 132* 130* 130* 129* 132*  K 4.0 4.0 3.8 3.9 3.8  CL 100 98 99 102 101  CO2 25 24 25 22 25   GLUCOSE 106* 93 119* 91 108*  BUN 5* 5* 5* <5* 5*  CREATININE 0.73 0.79 0.72 0.55* 0.79  CALCIUM 10.6* 10.5* 10.0 9.5 10.6*  MG  --  1.5* 1.9  --   --      CBC: Recent Labs  Lab 08/02/22 0057 08/03/22 0120 08/04/22 0112 08/05/22 0129 08/06/22 0145  WBC 11.2* 6.9 4.4 4.7 5.6  HGB 9.7* 9.2* 9.0* 8.3* 9.2*  HCT 29.0* 27.7* 27.3* 25.2* 28.4*  MCV  108.2* 107.4* 107.9* 107.7* 106.0*  PLT 221 209 201 216 260         SIGNED:   Debbe Odea, MD  [facility], hypophosphatemia, [facility] 08/07/2022, 11:02 AM   slightly hypophosphatemia

## 2022-08-07 NOTE — TOC Progression Note (Signed)
Transition of Care [facility]) - Progression Note    Patient Details  Name: [name] MRN: [id number] Date of Birth: 07-13-1981  Transition of Care [facility]) CM/SW [place], RN Phone Number: 08/07/2022, 2:58 PM  Clinical Narrative:    CM met with the patient in the hospital room to discuss with patient that he will be ready for discharge to home - pending tomorrow - 08/08/2022.  The patient has two available family members in the room that plan to drive him home to his sisters house - [name] that resides in [place], [place].  I discussed with the patient to establish a primary care visit as soon as he arrives to [place] to see a physician for a hospital follow up in the next 7-10 days and obtain hospital records from [facility].  The patient was provided with Medical record number to contact.  The patient has RW at the bedside and plans to take it with him tomorrow when he is discharged.  Discharged medications will be called into the [facility] pharmacy by Dr. Wynelle Cleveland in the am before he is discharged to dispense to him before he is discharged with family.         Expected Discharge Plan and Services         Expected Discharge Date: 08/07/22                                     Social Determinants of Health (SDOH) Interventions SDOH Screenings   Food Insecurity: No Food Insecurity (07/26/2022)  Housing: Medium Risk (07/26/2022)  Transportation Needs: No Transportation Needs (07/26/2022)  Utilities: Not At Risk (07/26/2022)  Tobacco Use: Low Risk  (07/19/2022)    Readmission Risk Interventions     No data to display

## 2022-08-07 NOTE — Discharge Instructions (Signed)
Do not drink alcohol.  Do not drink more than 1.5 Liters of liquids a day.   Please review all of you discharge paperwork on the day of discharge and be sure you have all of your prescribed medications.  Please request your Primary MD to go over all Hospital Tests and Procedure/Radiological results at the follow up Please get all Hospital records sent to your primary MD by signing hospital release before you go home.   In some cases, there will be blood work, cultures and biopsy results pending at the time of your discharge. Please request that your primary care M.D. goes through all the records of your hospital data and follows up on these results.  Please take all your medications with you for your next visit with your Primary MD   Please request your Primary MD to go over all hospital tests and procedure/radiological results at the follow up, please ask your Primary MD to get all Hospital records sent to his/her office.   You must read complete instructions/literature along with all the possible adverse reactions/side effects for all the Medicines you take and that have been prescribed to you. Take any new Medicines after you have completely understood and accpet all the possible adverse reactions/side effects.    Do not drive or operate heavy machinery when taking Pain medications.    Do not take more than prescribed Pain, Sleep and Anxiety Medications  If you have smoked or chewed Tobacco  in the last 2 yrs please stop smoking, stop any regular Alcohol  and or any Recreational drug use.   Wear Seat belts while driving.   If you had Pneumonia or Lung problems at the Hospital: Please get a 2 view Chest X ray done in 6-8 weeks after hospital discharge or sooner if instructed by your Primary MD.   If you have Congestive Heart Failure: Please call your Cardiologist or Primary MD anytime you have any of the following symptoms:  1) 3 pound weight gain in 24 hours or 5 pounds in 1 week  2)  shortness of breath, with or without a dry hacking cough  3) swelling in the hands, feet or stomach  4) if you have to sleep on extra pillows at night in order to breathe 5) Follow cardiac low salt diet and 1.5 lit/day fluid restriction.   If you have Diabetes Accuchecks 4 times/day- once on AM empty stomach and then before each meal. Log in all results and show them to your primary doctor at your next visit. If any glucose reading is under 60 or above 400 call your primary MD immediately.   If you have Seizure/Convulsions/Epilepsy: Please do not drive, operate heavy machinery, participate in activities at heights or participate in high speed sports until you have seen by Primary MD or a Neurologist and advised to do so again. Per [facility] statutes, patients with seizures are not allowed to drive until they have been seizure-free for six months.  Use caution when using heavy equipment or power tools. Avoid working on ladders or at heights. Take showers instead of baths. Ensure the water temperature is not too high on the home water heater. Do not go swimming alone. Do not lock yourself in a room alone (i.e. bathroom). When caring for infants or small children, sit down when holding, feeding, or changing them to minimize risk of injury to the child in the event you have a seizure. Maintain good sleep hygiene. Avoid alcohol.    If  you had Gastrointestinal Bleeding: Please ask your Primary MD to check a complete blood count within one week of discharge or at your next visit. Your endoscopic/colonoscopic biopsies that are pending at the time of discharge, will also need to followed by your Primary MD.  Please note You were cared for by a hospitalist during your hospital stay. If you have any questions about your discharge medications or the care you received while you were in the hospital after you are discharged, you can call the unit and asked to speak with the hospitalist on call if the  hospitalist that took care of you is not available. Once you are discharged, your primary care physician will handle any further medical issues. Please note that NO REFILLS for any discharge medications will be authorized once you are discharged, as it is imperative that you return to your primary care physician (or establish a relationship with a primary care physician if you do not have one) for your aftercare needs so that they can reassess your need for medications and monitor your lab values.   You can reach the hospitalist office at phone [phone] or fax [phone]   If you do not have a primary care physician, you can call [phone] for a physician referral.

## 2022-08-08 ENCOUNTER — Other Ambulatory Visit (HOSPITAL_COMMUNITY): Payer: Self-pay

## 2022-08-08 DIAGNOSIS — J9601 Acute respiratory failure with hypoxia: Secondary | ICD-10-CM | POA: Diagnosis not present

## 2022-08-08 DIAGNOSIS — R2241 Localized swelling, mass and lump, right lower limb: Secondary | ICD-10-CM | POA: Diagnosis not present

## 2022-08-08 DIAGNOSIS — Z515 Encounter for palliative care: Secondary | ICD-10-CM

## 2022-08-08 LAB — GLUCOSE, CAPILLARY
Glucose-Capillary: 99 mg/dL (ref 70–99)
Glucose-Capillary: 96 mg/dL (ref 70–99)

## 2022-08-08 NOTE — TOC Transition Note (Signed)
Transition of Care [facility]) - CM/SW Discharge Note   Patient Details  Name: [name] MRN: [id number] Date of Birth: Sep 03, 1980  Transition of Care [facility]) CM/SW Contact:  Curlene Labrum, RN Phone Number: 08/08/2022, 10:29 AM   Clinical Narrative:    CM met with the patient and brother at the bedside and the patient is aware that he is medically stable for discharge today.  He verbalized concern over the weather up [place] in [place], and I spoke with him about staying in a hotel or friends if he is concerned about road conditions in the area.  The patient plans to establish a PCP in [place] when he arrives and obtain hospital records for follow up care.  The patient's brother is present in the room and plans to drive the patient back to [place] today.  Attending MD is aware.         Patient Goals and CMS Choice      Discharge Placement                         Discharge Plan and Services Additional resources added to the After Visit Summary for                                       Social Determinants of Health (SDOH) Interventions SDOH Screenings   Food Insecurity: No Food Insecurity (07/26/2022)  Housing: Medium Risk (07/26/2022)  Transportation Needs: No Transportation Needs (07/26/2022)  Utilities: Not At Risk (07/26/2022)  Tobacco Use: Low Risk  (07/19/2022)     Readmission Risk Interventions     No data to display
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
## 2022-08-12 NOTE — Progress Notes (Deleted)
Cardiology Office Note:   Date:  08/12/2022  NAME:  [name]    MRN: [id number] DOB:  [birth year]   PCP:  Patient, No Pcp Per  Cardiologist:  None  Electrophysiologist:  None   Referring MD: No ref. provider found   No chief complaint on file. ***  History of Present Illness:   [name] is a 42 y.o. male with a hx of acute viral pericarditis who presents for follow-up.   Problem List Metastatic synovial sarcoma  Pericarditis  -Dx 06/2022 in setting of RSV PNA 3. Cirrhosis/etoh abuse  4. DVT/PE/CTEPH  Past Medical History: Past Medical History:  Diagnosis Date   Epistaxis    Tooth infection     Past Surgical History: Past Surgical History:  Procedure Laterality Date   arm surgery      Current Medications: No outpatient medications have been marked as taking for the 08/14/22 encounter (Appointment) with Geralynn Rile, MD.     Allergies:    Patient has no known allergies.   Social History: Social History   Socioeconomic History   Marital status: Single    Spouse name: Not on file   Number of children: Not on file   Years of education: Not on file   Highest education level: Not on file  Occupational History   Not on file  Tobacco Use   Smoking status: Never   Smokeless tobacco: Never  Substance and Sexual Activity   Alcohol use: Yes    Comment: 5th liquor daily   Drug use: No   Sexual activity: Not on file  Other Topics Concern   Not on file  Social History Narrative   Not on file   Social Determinants of Health   Financial Resource Strain: Not on file  Food Insecurity: No Food Insecurity (07/26/2022)   Hunger Vital Sign    Worried About Running Out of Food in the Last Year: Never true    Ran Out of Food in the Last Year: Never true  Transportation Needs: No Transportation Needs (07/26/2022)   PRAPARE - Hydrologist (Medical): No    Lack of Transportation (Non-Medical): No  Physical Activity: Not on  file  Stress: Not on file  Social Connections: Not on file     Family History: The patient's ***family history is negative for Heart failure, Heart attack, and Fainting.  ROS:   All other ROS reviewed and negative. Pertinent positives noted in the HPI.     EKGs/Labs/Other Studies Reviewed:   The following studies were personally reviewed by me today:  EKG:  EKG is *** ordered today.  The ekg ordered today demonstrates ***, and was personally reviewed by me.   Recent Labs: 08/04/2022: Magnesium 1.9 08/06/2022: ALT 16; BUN 5; Creatinine, Ser 0.79; Hemoglobin 9.2; Platelets 260; Potassium 3.8; Sodium 132   Recent Lipid Panel    Component Value Date/Time   TRIG 109 07/18/2022 0647    Physical Exam:   VS:  There were no vitals taken for this visit.   Wt Readings from Last 3 Encounters:  08/08/22 207 lb 3.7 oz (94 kg)  05/27/20 222 lb (100.7 kg)  11/22/19 225 lb (102.1 kg)    General: Well nourished, well developed, in no acute distress Head: Atraumatic, normal size  Eyes: PEERLA, EOMI  Neck: Supple, no JVD Endocrine: No thryomegaly Cardiac: Normal S1, S2; RRR; no murmurs, rubs, or gallops Lungs: Clear to auscultation bilaterally, no wheezing, rhonchi or rales  Abd: Soft,  nontender, no hepatomegaly  Ext: No edema, pulses 2+ Musculoskeletal: No deformities, BUE and BLE strength normal and equal Skin: Warm and dry, no rashes   Neuro: Alert and oriented to person, place, time, and situation, CNII-XII grossly intact, no focal deficits  Psych: Normal mood and affect   ASSESSMENT:   [name] is a 42 y.o. male who presents for the following: No diagnosis found.  PLAN:   There are no diagnoses linked to this encounter.  {Are you ordering a CV Procedure (e.g. stress test, cath, DCCV, TEE, etc)?   Press F2        :[id number]}  Disposition: No follow-ups on file.  Medication Adjustments/Labs and Tests Ordered: Current medicines are reviewed at length with the patient  today.  Concerns regarding medicines are outlined above.  No orders of the defined types were placed in this encounter.  No orders of the defined types were placed in this encounter.   There are no Patient Instructions on file for this visit.   Time Spent with Patient: I have spent a total of *** minutes with patient reviewing hospital notes, telemetry, EKGs, labs and examining the patient as well as establishing an assessment and plan that was discussed with the patient.  > 50% of time was spent in direct patient care.  Signed, Addison Naegeli. Audie Box, MD, [place]  [address] [place], [place] [address] [phone]  08/12/2022 3:30 PM

## 2022-08-13 ENCOUNTER — Telehealth: Payer: Self-pay | Admitting: Medical Oncology

## 2022-08-13 NOTE — Telephone Encounter (Signed)
Records requested

## 2022-08-13 NOTE — Telephone Encounter (Signed)
Records request on this pt . And to send to [place] , [place]. I asked for specific information and told assistant pt needs to sign a release. She will f/u with pt.

## 2022-08-14 ENCOUNTER — Ambulatory Visit: Payer: BC Managed Care – PPO | Attending: Cardiovascular Disease | Admitting: Cardiovascular Disease

## 2022-08-14 DIAGNOSIS — I301 Infective pericarditis: Secondary | ICD-10-CM

## 2022-09-08 ENCOUNTER — Telehealth: Payer: Self-pay | Admitting: Medical Oncology

## 2022-09-08 NOTE — Telephone Encounter (Signed)
Records faxed.

## 2023-01-29 NOTE — Nursing Note (Signed)
01/29/23 1057   Patient Interview   Type of Readmission  Planned     PCRM 30 Day Initial Assessment:    Chart reviewed in [facility].  Patient has had an initial assessment completed within the past 30 days.  Please refer to assessment completed on 6/21 by PCRM.      PCRM met with patient/family.  Explained role and function of PCRM in multidisciplinary team.  Contact number provided for questions.  Previous initial assessment reviewed with patient/family for accuracy and any changes since last assessment.  Patient confirms no changes in assessment / Patient provided the following updates since last assessment:  none    Reason for admission:  chemo cycle 2    Anticipated discharge disposition:  home    Patient Goals for Discharge: home    Final plan will be determined closer to discharge, pending therapy and medical team recommendations.  Patient/family verbalized understanding and agreement with the plan of care.  Patient/family have no questions at this time.  PCRM will continue to follow patient with multidisciplinary team for ongoing assessment of needs and for discharge planning.  Medical team updated.    Ruthine Dose., BSN, RN, CCM PCRM  [phone]

## 2023-01-30 NOTE — Progress Notes (Signed)
-------------------------------------------------------------------------------  Summary: Psychosocial Assessment- follow up  -------------------------------------------------------------------------------    Psychosocial Assessment Follow Up    Psychosocial Assessment completed on 12/25/22 by this Clinical research associate.     SW met with patient to introduce self, explain social worker role during inpatient stay, and answer questions. Patient was alert and oriented x4, and agreeable to SW visit.    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     Contact Information:  Case Manager Name: Ruthine Dose  Case Manager's Phone Number: [phone]  Social Work Contact Name: Irineo Axon  Social Worker's Phone Number: [phone]    Advance Directive Discussion: Per chart review, patient already has completed Healthcare Power of Attorney [facility]) on file. SW reviewed the document(s) with patient and they confirmed documents are current and reflective of their wishes.     The following individual(s) were named as decision makers in patient's HCPOA:  Primary Agent: sister, Erin Kellie, (ph: [phone])  First Alternate Agent: brother, Demorris Janis, (ph: [phone])   Second Alternate Agent: brother, "Rob" [employer], (ph: [phone])      Legal NOK: Siblings (above)    Patient Coping/Stress Concerns:  Patient Coping/Stress Concerns: No    Food Insecurity:  Within the past 12 months, you worried that your food would run out before you got the money to buy more.: Never true  Within the past 12 months, the food you bought just didn't last and you didn't have money to get more.: Never true    Housing Stability:  In the last 12 months, was there a time when you were not able to pay the mortgage or rent on time?: No  In the last 12 months, how many places have you lived?: 2  In the  last 12 months, was there a time when you did not have a steady place to sleep or slept in a shelter (including now)?: No    Utilities:  In the past 12 months has the electric, gas, oil, or water company threatened to shut off services in your home?: No    Transportation Needs:  In the past 12 months, has lack of transportation kept you from medical appointments or from getting medications?: No  In the past 12 months, has lack of transportation kept you from meetings, work, or from getting things needed for daily living?: No    Alcohol Use:  Q1: How often do you have a drink containing alcohol?: Never  Q2: How many drinks containing alcohol do you have on a typical day when you are drinking?: Patient does not drink  Q3: How often do you have six or more drinks on one occasion?: Never    Substance Use:  How many times in the past year have you used illegal drugs?: Never    Intimate Partner Violence:  Within the last year, have you been afraid of your partner or ex-partner?: No  Within the last year, have you been humiliated or emotionally abused in other ways by your partner or ex-partner?: No  Within the last year, have you been kicked, hit, slapped, or otherwise physically hurt by your partner or ex-partner?: No  Within the last year, have you been raped or forced to have any kind of sexual activity by your partner or ex-partner?: No    Community Resources:  Pt is linked with [employer]  Dept as well as [place] for local lodging needs. Pt will be able to take growth factor home with him this admission so no need for a hotel stay.    Anticipated Discharge Plan:  Anticipated Discharge Plan: Home    Medical Team Considerations: None at this time    SW Interventions/Recommendations:  Service SW name and contact information placed on white board in patient's room to contact as needed.  SW will continue to remain available to provide assistance and support as needed during inpatient stay.  SW provided active and  empathetic listening, validation of feelings, encouragement, and emotional support.        Irineo Axon, LISW-S  ONC 2 Social Worker  [facility] [phone]      For Evening (4:30pm-8am) and Weekend SW needs please call [phone]

## 2023-01-30 NOTE — Progress Notes (Signed)
The chaplain introduced spiritual/religious support to the patient who expressed appreciation and did not have pastoral care needs at this time.     Patricia Pesa, [facility], MDIV  Senior Chaplain, Fayrene Fearing 20 and 21  James 24/7 On-Call Pager:  [phone]     01/30/23 1516   Clinical Encounter Type   Visited With Patient   Visit Type Introduction   Pastoral Time Spent 15 min   Interventions   Provided  Supportive presence   Chaplaincy Education   Chaplaincy Service  Available Yes   Educated  Patient   Plan of Care   Continue Visiting PRN

## 2023-01-30 NOTE — Progress Notes (Signed)
-------------------------------------------------------------------------------  Attestation signed by Maple Mirza, MBBS at 01/31/2023  1:33 PM  Medical Oncology Attending Note    HISTORY: Doing well without any change in urine or mental status.     PHYSICAL EXAM:  Vitals: BP 109/55 (BP Location: Left arm, BP Position: Sitting)   Pulse 86   Temp 97.9 F (36.6 C) (Oral)   Resp 17   Ht 1.88 m (6\' 2" )   Wt 115.8 kg (255 lb 6.4 oz)   SpO2 98%   BMI 32.79 kg/m   Smoking Status Never   Patient's Current Performance Status 0  General/Constitutional: Well developed, well nourished male, who looks their stated age of 42 y.o..  No acute distress.   HEENT: Head: Normocephalic and atraumatic.  Eyes: Pupils are equal, round, and reactive to light and accomodation. Extraocular movements are intact. Sclerae are anicteric.   Neck: Supple, non-tender, with no lymphadenopathy.    Lymphatic Exam:   No peripheral lymphadenopathy noted.  Cardiac: Regular rate and rhythm.  Normal S1, S2.  No murmurs, rubs or gallops.  Pulmonary/Chest:  Lungs are clear to auscultation bilaterally.  No wheezes, rhonchi or rales noted.    Abdominal:   Abdomen  with normoactive bowel sounds in all four quadrants.  Soft, non-tender, non-distended.  No organomegaly.    Musculoskeletal: Normal range of motion in all four extremities, with normal strength equally and symmetrically.    Extremities:  No cyanosis or clubbing or peripheral edema.   Neurological:  Conscious, alert and oriented.  Cranial nerves II through XII are intact grossly and symmetrically.  No focal neurologic deficit.    Skin: Skin is warm and dry. He is not diaphoretic.    Psychiatric:  Appropriate mood and affect.   Back: No CVA or point vertebral tenderness.  Chest Wall: No abnormalities noted.     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 Will continue to monitor urine and neurological status  for complications. Anticipate discharge 02-02-23.   I saw and evaluated the patient with the nurse practitioner. I provided a substantive portion of the care for this patient. I personally performed all aspects of the medical decision making for this encounter. I have reviewed and verified this documentation and it accurately reflects our care."        Kyle Howell MBBS  Assistant Professor,  Department of Internal Medicine,  Division of Medical Oncology,  The [facility],  [facility]  [address] [place],  [place], [place] [address].     -------------------------------------------------------------------------------    INPATIENT PROGRESS NOTE  MRN: [id number]  TODAY'S DATE: 01/30/2023  ADMIT DATE: 01/28/2023  5:07 PM   REASON FOR ADMISSION: C2 IE  Primary Oncologist: Dr. Soyla Dryer     ASSESSMENT AND PLAN     Kyle Howell is a 42 y.o. 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    ACUTE PROBLEMS     Synovial sarcoma, RLE  - Metastatic to lungs, pleura.  - He is s/p 6 cycles of HD-AIM.    - Currently on IE with plan to treat to plateau, then switch to pazopanib maintenance. Cleared for C2 IE, started on 7/10 and will end 7/15 at 1100  - q8 UAs without hematuria, no signs of ifos neurotoxicity  - Lasix PRN during hospital for target I/O net neutral during hospital  stay.  7/11 20 mg IV lasix x 1, pt reports slightly increased swelling in LLE, Net positive 1L/24h, received additional 20 mg of lasix IV on 7/12  - Weekly CBC, CMP at discharge    Previous ifos neurotoxicity  - Continue thiamine q8h, no evidence of this currently     Macrocytic Anemia  - Hgb 7.2, baseline in 7s - 8s  - B12 high, folate normal. Iron studies look like AOCD. No signs of bleeding  -7/12: Hgb 7.0 with recheck of 6.6, 2 units of PRBC ordered. Hgb recheck post transfusion ordered  - Transfuse PRN for target Hgb 8 at  discharge  - Trend CBC daily     DVT, massive PE  - S/p IVC filter placement 08/26/22 by IR   - Required lovenox titration to ensure therapeutic level.  - Now on Eliquis.  - encouraged patient to continue lymphedema wrapping of his leg.      Follow up  - RTC 3 weeks (7/31) with labs and restaging scans prior to C3 IE  - Plan on Nyvepria to bedside at discharge.  - Restaging CT scans in 6 weeks.    Body mass index is 32.79 kg/m.    Complexity.               Obesity Body mass index is 32.79 kg/m. - Follow with PCP for dietary and lifestyle modifications.          Any conditions listed below are present on admission unless otherwise specified.    .None    RESOLVED PROBLEMS  --     CHRONIC PROBLEMS   Cancer related pain, neuropathy  - continue gabapentin 600mg  TID     Hx CVA  - Most likely embolic at time of diagnosis. No obvious neuro deficits   - Anticoagulated as above for DVT/PE     DVT prophylaxis: Eliquis  Diet: DIET REGULAR  Continuous Infusions:   . Sodium chloride 0.9% 20 mL/hr at 01/29/23 0427     Code Status: Full Code     Serious Illness Conversation: not at this time    Disposition: The patient will require continued hospitalization for C2 IE chemotherapy. They are expected to discharge to home . Today they are expected to be medically ready for discharge on 7/15   Follow-ups made: podiatry 7/16, med onc 7/31   Follow-ups needed: [facility]?     SUBJECTIVE   Kyle Howell was seen this morning sitting up in the chair, no visitors currently at bedside. No overnight complaints. Reports that the swelling in his LLE improved with the lasix on 7/11. Discussed that we are rechecking his Hgb level today and will potentially receive blood. Verbalized understanding. Reports that he will wrap his RLE for lymphedema. Surgical boot delivered to bedside on 7/11 per pt request. Denies fevers/chills, chest pain, SOB, n/v, constipation, or diarrhea.  All questions answered at this time.    OBJECTIVE   Temp:  [97.6 F (36.4  C)-98.6 F (37 C)] 97.8 F (36.6 C)  Pulse (Heart Rate):  [74-98] 92  Resp Rate:  [16] 16  BP: (120-144)/(71-89) 120/76  O2 Sat (%):  [95 %-98 %] 96 %    General: A&O to self, time, place, and situation. NAD.  HEENT: Alopecia. EOMI, anicteric sclerae, conjunctivae & lids symmetrical. No signs of inflammation. Neck no rigidity. Not HOH. MMM, no erythema, exudates, or lesions. Dentition intact.  Respiratory: Clear to auscultation bilaterally, no crackles/rhonchi/wheezes, no increased WOB on RA.  Cardiovascular: RRR, no  murmurs, rubs, clicks or gallops, chronic severe RLE edema, 1+ pitting edema in LLE, cap refill brisk.  Abdomen: Normoactive BS. Abdomen soft, nontender, nondistended. No palpable masses.  Neurologic: CN grossly intact. No focal deficits. Speech clear and coherent. Follows commands.  Skin: Color, texture, and turgor normal. No rashes or lesions.  Psychosocial: Affect appropriate   Mediport with no evidence of erythema, drainage, or tenderness. Dressing is clean, dry, and intact.  Lines/drains/airway with placement date:  --    Plan of care reviewed with the attending, Dr.Cherian: in agreement.    Lavada Mesi, APRN-CNP  Med Onc 2 APP may be reached from 7a-7p at pager [phone]. After 7p, please page moonlighter at [phone].

## 2023-02-02 NOTE — Progress Notes (Signed)
[facility] Note:    Bedside Delivery Documentation   The following prescription(s) were delivered to the patient's bedside.        Kyle Howell    Kyle Howell) [phone]    Kyle Howell [phone]  Kyle Howell Bedside Delivery [phone]    Kyle Howell [phone]  Kyle Howell Bedside Delivery [phone]    Kyle Howell [phone]  Kyle Howell Bedside Delivery [phone]    [place] [phone]

## 2023-02-02 NOTE — Discharge Summary (Signed)
-------------------------------------------------------------------------------  Attestation signed by Eula Flax, MBBS at 02/02/2023  1:36 PM  I evaluated this patient together with resident independently confirmed the history and physical exam findings. I agree with the documented history and physical exam findings. I have personally reviewed all the diagnostic images, pathology reports, and laboratory studies. I agree with the documented assessment and plan that I formulated and discussed with the patient. I have reviewed and edited this document as appropriate.   The patient appeared to have a good understanding of all that was discussed and all questions appeared to be answered to patients satisfaction.    Spent over 30 min in discharge planning and co-ordination of care     RLE synovial sarcoma , mets to lungs and pleura / started C3 IE on 7/10. Completed mesna on 02/02/2023 at 11 AM. GCSF will be picked up from [facility]. Weekly labs and delayed dex until 7/17. No major acute events,Left leg swelling resolving with diuresis. Podiatry appt rescheduled.     Thanks  Lenox Ahr, MD  Associate Professor   Medical Oncology  [facility]   Pager [phone]    -------------------------------------------------------------------------------      Discharge Summary     MRN:  [id number]  Name: Kyle Howell  Age: 42 y.o.  Birthday:  02-Feb-1981  Admit Date: 01/28/2023  5:07 PM  Discharge Date: 02/02/23    Admission Information  Admitting Physician: Maple Mirza, MBBS     Discharge Information  Discharge Physician: Lenox Ahr, MBBS    Problem List  Active Hospital Problems    Diagnosis    . Synovial cell sarcoma    . Admission for chemotherapy       Resolved Hospital Problems   No resolved problems to display.       Brief Summary of Hospital Course for Discharge Summary:   In summary, Kyle Howell is a 42 y.o. 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 Patient received C2 IE during admission and was discharged home 7/15 without complications.    OBJECTIVE    Temp:  [98 F (36.7 C)-98.8 F (37.1 C)] 98 F (36.7 C)  Pulse (Heart Rate):  [82-91] 89  Resp Rate:  [16-20] 16  BP: (102-133)/(52-69) 106/56  O2 Sat (%):  [97 %-98 %] 98 %    General: A&O to self, time, place, and situation. NAD.  HEENT: Alopecia. EOMI, anicteric sclerae, conjunctivae & lids symmetrical. No signs of inflammation. Neck no rigidity. Not HOH. MMM, no erythema, exudates, or lesions.   Respiratory: Clear to auscultation bilaterally, no crackles/rhonchi/wheezes, no increased WOB on RA.  Cardiovascular: RRR, no murmurs, rubs, clicks or gallops, chronic severe RLE edema, trace edema in LLE, cap refill brisk.  Abdomen: Normoactive BS. Abdomen soft, nontender, nondistended. No palpable masses.  Neurologic: CN grossly intact. No focal deficits. Speech clear and coherent. Follows commands.  Skin: Color, texture, and turgor normal. No rashes or lesions. LE edema R > L  Psychosocial: Affect appropriate   Mediport with no evidence of erythema, drainage, or tenderness. Dressing is clean, dry, and intact.    Patient's hospitalization was complicated by the following problems:    ACUTE PROBLEMS     Synovial sarcoma, RLE  - Metastatic to lungs, pleura.  - s/p 6 cycles of HD-AIM.    - Currently on IE with plan to treat to plateau, then switch to pazopanib maintenance.  Cleared for C2 IE, started on 7/10 and and completed 7/15 at 1100  - q8 UAs without hematuria, no signs of ifos neurotoxicity  - Lasix PRN during hospital for target I/O net neutral during hospital stay.   7/11-7/13 20 mg IV lasix x 1  - 7/14 I&Os reviewed. Net positive 790 ml, pt feels like LLE swelling continues to improve. Further lasix held off.  - Weekly CBC, CMP at discharge  - Nyvepria to be delivered from [facility]    Previous ifos  neurotoxicity  - Continue thiamine q8h, no evidence of this currently     Macrocytic Anemia  - Hgb 7.2, baseline in 7s - 8s  - B12 high, folate normal. Iron studies look like AOCD. No signs of bleeding  -7/12: Hgb 7.0 with recheck of 6.6, 2 units of PRBC ordered. Hgb recheck post transfusion ordered - 8.6  - Transfuse PRN for target Hgb 8 at discharge     DVT, massive PE  - S/p IVC filter placement 08/26/22 by IR   - Required lovenox titration to ensure therapeutic level.  - Now on Eliquis.  - encouraged patient to continue lymphedema wrapping of his right leg, given home supplies by bedside RN.      Follow up  - RTC 3 weeks (7/31) with labs and restaging scans prior to C3 IE  - Restaging CT scans in 6 weeks (not currently scheduled).     CHRONIC PROBLEMS   Cancer related pain, neuropathy  - continue gabapentin 600mg  TID     Hx CVA  - Most likely embolic at time of diagnosis. No obvious neuro deficits   - Anticoagulated as above for DVT/PE    Brief Summary of Consults for Discharge Summary:       Brief Summary of Procedures and Imaging for Discharge Summary:        Summary of last selected lab results and date obtained:    Lab Results   Component Value Date    WBC 7.26 02/02/2023    HGB 8.1 (L) 02/02/2023    HCT 26.4 (L) 02/02/2023    PLATELET 181 02/02/2023    MCV 110.9 (H) 02/02/2023     Lab Results   Component Value Date    SODIUM 137 02/02/2023    POTASSIUM 3.8 02/02/2023    CHLORIDE 103 02/02/2023    CO2 28 02/02/2023    BUN 15 02/02/2023    CREATSERUM 0.57 (L) 02/02/2023    GLUCOSE 122 (H) 02/02/2023     Lab Results   Component Value Date    ALT 10 02/02/2023    AST 18 02/02/2023    ALKPHOS 62 02/02/2023    BILITOTAL 0.6 02/02/2023    BILIDIRECT 0.1 02/02/2023       Brief Summary of Labs for Discharge Summary:        Discharge Orders   AMB REFERRAL TO ONCOLOGY DISCHARGE FOLLOW-UP CLINIC       Current Outpatient Meds:      Medication List for when you go home        START taking these medications        Morning  Afternoon Evening Bedtime As Needed   dexAMETHasone 4 MG TABS  Take 2 tablets by mouth every 24 hours for 1 day.  Commonly known as: DECADRON  For diagnoses: Sarcoma  Last time this was given: 8 mg on February 02, 2023 10:19 AM  CONTINUE taking these medications        Morning Afternoon Evening Bedtime As Needed   apixaban 5 MG TABS  Take 1 tablet by mouth every 12 hours. Getting through drug company ([employer])  Doctor's comments: Getting through drug company ([employer])  Commonly known as: ELIQUIS  For diagnoses: Acute deep vein thrombosis (DVT) of femoral vein of right lower extremity  Last time this was given: 5 mg on February 02, 2023 10:19 AM                       Aspirin Low Dose 81 MG CHEW chewable tablet  Chew and swallow 1 tablet by mouth daily.  Last time this was given: 81 mg on February 02, 2023 10:19 AM  Generic drug: aspirin                       ETOPOSIDE IV  by Intravenous route. Part of IE chemotherapy regimen  Last time this was given: 234 mg on January 31, 2023  9:36 PM                       Gabapentin 300 MG CAPS  Take 2 capsules by mouth 3 times daily.  Commonly known as: NEURONTIN  Last time this was given: 600 mg on February 02, 2023 10:19 AM                       IFOSFAMIDE IV  by Intravenous route. Part of IE chemotherapy regimen  Last time this was given: February 01, 2023  6:46 PM                       Lidocaine-prilocaine 2.5-2.5 % cream  Apply a thick layer topically 30-60 minutes before needle stick, then cover area.  Commonly known as: EMLA                       Nyvepria 6 MG/0.6ML SOSY injection  Inject one syringe under the skin 24-72 hours after completion of chemotherapy, once every 21 days  Generic drug: pegfilgrastim-apgf                       Prochlorperazine 10 MG TABS  Take 1 tablet by mouth every 6 hours as needed for Nausea / Vomiting.  Commonly known as: COMPAZINE  For diagnoses: Sarcoma                            Medication Instructions:    Nyvepria from [facility]            Follow-up:  No follow-up provider specified.       Upcoming Appointments (up to five)-Some appointments for Medical Center outpatient clinics or diagnostic testing locations are not displayed below         Provider Department Dept Phone    02/03/2023 11:45 AM [facility] Magda Bernheim at: Arrive to [address], first desk after entering [phone]    02/18/2023 2:30 PM ORTHO SARCOMA [facility] 5 RN LAB, [facility]     02/18/2023 3:20 PM [facility] Division of Medical Oncology  Arrive at: [place] to [employer] [phone]

## 2023-02-02 NOTE — Nursing Note (Signed)
RN reviewed AVS and prescriptions with patient. All questions answered. All belongings gathered and given to patient. Patient discharged via w/c with staff to [facility].

## 2023-02-02 NOTE — Nursing Note (Signed)
02/02/23 1255   Final Discharge Planning   Discharge Disposition Home   Services at Discharge Outpatient clinical services (ie: lab draws, transfusions, injectables)   CM/SW AVS Portion Completed Yes   Plan   Plan return home with assist from family as needed   Patient/Family In Agreement With Plan yes   Transport Request   Mode of Transfer Private Vehicle     [facility] PCRM Discharge Note    Patient discussed in medical rounds for discharge to home. PCRM met with the patient/family/spouse to discuss final discharge plan.       Services for Discharge  None needed     Consults with Final Discharge Recommendations  No outstanding consults    Lines/Tubes/Drains/Wounds/Supplies  Port - to be de-accessed prior to DC     Medications  No barriers anticipated in obtaining discharge medications.  No prior authorizations anticipated.   Reconciliation of medications to be completed by the medical team.  Darlina Sicilian from [facility] Medical Equipment  No needs    Choice  Was Patient Choice Provided: N/A    Transportation  Transportation will be provided by family.    Education  Discharge education provided by the medical team and updated in the After Visit Summary.       Follow Up(s)  Any follow up requested by the medical team arranged.   Appointments in the After Visit Summary.       Was Ambulatory PCRM added to the Care Team?  Yes- Handoff criteria met    Was a handoff made to an Ambulatory PCRM?    yes    Referral completed and sent to [facility].       The PCRM has updated the patient's nurse regarding the final discharge plan.        Risk of Readmission: 14.6  Category Reference:          Low: 0% - 5%          Medium - Low: 5.1% - 10%          Medium - High:   10.1% - 16%          High: 16.1% - 100%    Readmission Risk Interventions Documented: Yes    No other discharge needs have been identified at this time. This plan was developed in collaboration with the patient and caregiver/preferred decision  maker. Patient and family are in agreement with final discharge plan. Please refer to AVS and medical record for additional information. Patient instructed to call with questions. PCRM will continue to follow with medical team for any additional discharge planning needs.     Ruthine Dose., BSN, RN, CCM [facility]  [phone]      If any changes to this individualized plan of care during evening and weekend hours and assistance is needed, please page the on call PCRM at [phone].

## 2023-02-04 NOTE — Progress Notes (Signed)
[facility] Post-Discharge Assessment    OP PCRM noted patient was discharged yesterday 02/02/23. Handoff received from IP PCRM.  Patient was discharged to Home after hospitalization for cycle 2 of IE. 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     Unable to contact patient. PCRM accidentally called patient sister Moody Bruins, whom confirmed that the patient was doing well since he got home and wasn't have any symptoms.   Patient did not receive an order for his weekly labs. Due to being unable to contact patient reached out to APRN, Ladona Ridgel, asking that she please confirm with patient about labs and place if did not receive order. PCRM can fax to local lab again if that is patients preference still. Will follow up.     Symptom Assessment:  Pain: denies   Nausea/vomiting: Denies   Diarrhea/constipation: denies  Fever: denies  Chills: denies  Fatigue: denies  Weakness: denies   Edema: denies any changes  Self Care ability: no concerns    Airway/Drains/Incisions/Lines/Ostomy/Wounds Assessment:   Lines -mediport (replaced 01/27/23)      Education/When to call the MD/RN:   [facility] educated patient/caregiver on the following, which would warrant a phone call to the office; signs/symptoms of infection, fever of greater than 100.4 lasting longer than 1 hour, persistent nausea and vomiting, uncontrolled bleeding, uncontrolled pain. Patient/caregiver verbalized understanding.     Discharge instructions:  Reviewed AVS instructions with patient/caregiver.      Medication Review  Reviewed discharge medication list with patient/caregiver.     MEDICATIONS LIST: Current Meds:    Current Outpatient Medications   Medication Sig   . apixaban 5 MG tablet Take 1 tablet by mouth every 12 hours.   Marland Kitchen aspirin 81 MG Chew Tab chewable tablet Chew and swallow 1 tablet by mouth daily.   . Gabapentin 300 MG capsule Take 2 capsules by mouth 3 times daily.   . Lidocaine-prilocaine 2.5-2.5 % cream Apply a thick layer topically 30-60  minutes before needle stick, then cover area.   . pegfilgrastim-apgf (Nyvepria) 6 MG/0.6ML Solution Prefilled Syringe injection Inject one syringe under the skin 24-72 hours after completion of chemotherapy, once every 21 days   . Prochlorperazine 10 MG tablet Take 1 tablet by mouth every 6 hours as needed for Nausea / Vomiting.     Patient able to obtain prescriptions? Picked up from [facility] prior to discharge   Questions about home medications or administration? denies    Services at [employer] Equipment/Supplies  N/A-no new needs     Follow Up:  Reviewed patient's upcoming medical appointments.  Patient/caregiver confirmed appointments and denied any transportation needs.      Future Appointments   Date Time Provider Department Center   02/05/2023 11:20 AM Leroy Libman, APRN-CNP [facility]   02/18/2023  2:30 PM ORTHO SARCOMA CCCT 5 RN LAB, [facility] CCCT   02/18/2023  3:20 PM Bertram Gala, MD CT5MOS CCCT     Patient/caregiver denies any other questions, concerns, or needs at this time, and is aware to call if any arise.  PCRM provided patient/caregiver with contact information. PCRM encouraged patient/caregiver to reach out with any questions or concerns.    Patient Care Plan  Care Plans not addressed this encounter.     Services being utilized    [facility] CBC/CMP  [address]  [address]  [place], [place] [address]  Ph: [phone]  Fax: [phone]  Labs hours M-F 6:30am-5:00pm-walk ins  [facility] Home Medical/[facility]/Hosp bed/walker  Ph: [phone]  Fax: [phone]    [facility]-approved 100%    Medicaid-active as 11/19/22  SA-approved at 100% through 11/10/23  HCAP approved at 100%    [facility] "Gift of Kindness" paid for Eliquis on discharge 11/12/22.     Update 01/14/23: Patient now has insurance and filled through [facility] 01/08/23. Patient has been approved through [employer], BMR [phone]), for free drug, will be filled  through their preferred pharmacy, [facility], per MAP Caileen Speaker documentation prescription was sent with 3 refills.     Patient Care Instructions  LDAs  -mediport (Replaced 01/27/23)    Plan for next [facility] outreach  PCRM will plan to follow up approximately on 02/06/23 for care plan monitoring, confirmation of lab orders and assess for acute and ongoing case management needs.     Maryan Rued BSN, RN, PCRM  [facility]  Ambulatory Sarcoma Medical Oncology  Nurse Case Manager  Ph: [phone]    If assistance is needed during evening and weekend hours, please call medical oncology clinic at [phone].

## 2023-02-06 NOTE — Progress Notes (Addendum)
PCRM attempted to call Kyle Howell to follow up regarding his lab orders and ensure that he remembers to go, unfortunately unable to contact or LVM. PCRM also attempted his sister, Kyle Howell was able to LVM with the lab information and encouraged a return call. Also asked them to call this PCRM if there were any issues obtaining his labs.   PCRM called [facility] and confirmed they still did have active orders for Kyle Howell.     UPDATE 1520: Moody Bruins returned PCRM call. Verified labs needed next week and orders at local lab. Also confirmed patients next appointment on 02/18/23 w/ admission for treatment.     Patient Care Plan  Care plan(s) not assessed this encounter due to care plan monitoring only.    Services being utilized    [facility] Medical/[facility]/Hosp bed/walker  Ph: [phone]  Fax: [phone]    [facility]-approved 100%  Ph: [phone]    Medicaid-active as 11/19/22  SA-approved at 100% through 11/10/23  HCAP approved at 100%    [facility] "Gift of Kindness" paid for Eliquis on discharge 11/12/22.     [facility]  [address]  [address]  [place], [place] [address]  Ph: [phone]  Fax: [phone]  Labs hours M-F 6:30am-5:00pm-walk ins    Patient Care Instructions  LDAs  -mediport (removed 12/18/22)- replaced 01/27/23    Plan for next [facility] outreach  PCRM will plan to follow up approximately on 02/18/23 (medonc) for care plan monitoring and assess for ongoing case management needs.   Also plan to encourage signing up for mychart as well.     Maryan Rued BSN, RN, PCRM  [facility]  Ambulatory Sarcoma Medical Oncology  Nurse Case Manager  Ph: [phone]    If assistance is needed during evening and weekend hours, please call medical oncology clinic at [phone].

## 2023-02-08 ENCOUNTER — Emergency Department: Admit: 2023-02-08 | Payer: MEDICAID

## 2023-02-08 ENCOUNTER — Inpatient Hospital Stay
Admission: EM | Admit: 2023-02-08 | Discharge: 2023-02-08 | Disposition: A | Discharge status: Still In Hospital | Payer: MEDICAID | Admitting: Internal Medicine

## 2023-02-08 DIAGNOSIS — R042 Hemoptysis: Secondary | ICD-10-CM

## 2023-02-08 DIAGNOSIS — J9601 Acute respiratory failure with hypoxia: Secondary | ICD-10-CM

## 2023-02-08 LAB — BLOOD ID PANEL, MOLECULAR
Acinetobacter calcoac baumannii complex by PCR: NOT DETECTED
Bacteroides fragilis by PCR: NOT DETECTED
Candida albicans by PCR: NOT DETECTED
Candida auris by PCR: NOT DETECTED
Candida glabrata by PCR: NOT DETECTED
Candida krusei by PCR: NOT DETECTED
Candida parapsilosis by PCR: NOT DETECTED
Candida tropicalis by PCR: NOT DETECTED
Cryptococcus neoformans/gattii by PCR: NOT DETECTED
Enterobacter cloacae complex by PCR: NOT DETECTED
Enterobacteriaceae by PCR: NOT DETECTED
Enterococcus faecalis by PCR: NOT DETECTED
Enterococcus faecium by PCR: NOT DETECTED
Escherichia coli by PCR: NOT DETECTED
Haemophilus Influenzae by PCR: NOT DETECTED
Klebsiella aerogenes by PCR: NOT DETECTED
Klebsiella oxytoca by PCR: NOT DETECTED
Klebsiella pneumoniae group by PCR: NOT DETECTED
Listeria monocytogenes by PCR: NOT DETECTED
Neisseria meningitidis by PCR: NOT DETECTED
Proteus species by PCR: NOT DETECTED
Pseudomonas aeruginosa by PCR: NOT DETECTED
Salmonella species by PCR: NOT DETECTED
Serratia marcescens by PCR: NOT DETECTED
Staphylococcus aureus by PCR: NOT DETECTED
Staphylococcus epidermidis by PCR: NOT DETECTED
Staphylococcus lugdunensis by PCR: NOT DETECTED
Staphylococcus species by PCR: NOT DETECTED
Stenotrophomonas maltophilia by PCR: NOT DETECTED
Streptococcus agalactiae by PCR: DETECTED — AB
Streptococcus pneumoniae by PCR: NOT DETECTED
Streptococcus pyogenes  by PCR: NOT DETECTED
Streptococcus species by PCR: DETECTED — AB

## 2023-02-08 LAB — PREPARE RBC (CROSSMATCH)
Dispense Status Blood Bank: TRANSFUSED
Dispense Status Blood Bank: TRANSFUSED

## 2023-02-08 LAB — POCT ARTERIAL
Base Excess, Arterial: -8 — ABNORMAL LOW (ref <=3)
Base Excess, Arterial: -11 — ABNORMAL LOW (ref <=3)
Calcium, Ionized: 1.41 mmol/L — ABNORMAL HIGH (ref 1.12–1.32)
Calcium, Ionized: 1.3 mmol/L (ref 1.12–1.32)
Est, Glom Filt Rate: 64 (ref >60)
Est, Glom Filt Rate: 45 — AB (ref >60)
FIO2: 50
FIO2: 50
HCO3, Arterial: 19.4 mmol/L — ABNORMAL LOW (ref 21.0–29.0)
HCO3, Arterial: 17.7 mmol/L — ABNORMAL LOW (ref 21.0–29.0)
Hemoglobin: 4.1 gm/dL — CL (ref 13.5–17.5)
Hemoglobin: 6.7 gm/dL — CL (ref 13.5–17.5)
Lactate: 0.74 mmol/L (ref 0.40–2.00)
Lactate: 0.85 mmol/L (ref 0.40–2.00)
O2 Sat, Arterial: 94 % (ref 93–100)
O2 Sat, Arterial: 96 % (ref 93–100)
POC Chloride: 98 mEq/L — ABNORMAL LOW (ref 99–110)
POC Chloride: 104 mEq/L (ref 99–110)
POC Creatinine: 1.4 mg/dL — ABNORMAL HIGH (ref 0.8–1.3)
POC Creatinine: 1.9 mg/dL — ABNORMAL HIGH (ref 0.8–1.3)
POC Glucose: 104 mg/dl — ABNORMAL HIGH (ref 70–99)
POC Glucose: 109 mg/dl — ABNORMAL HIGH (ref 70–99)
POC Hematocrit: 12 % — CL (ref 41–53)
POC Hematocrit: 20 % — CL (ref 41–53)
POC Potassium: 3.3 mEq/L — ABNORMAL LOW (ref 3.5–5.1)
POC Potassium: 3.7 mEq/L (ref 3.5–5.1)
POC Sodium: 128 mEq/L — ABNORMAL LOW (ref 136–145)
POC Sodium: 131 mEq/L — ABNORMAL LOW (ref 136–145)
TCO2, Arterial: 21 mmol/L (ref 21–32)
TCO2, Arterial: 19 mmol/L — ABNORMAL LOW (ref 21–32)
pCO2, Arterial: 46 mm Hg — ABNORMAL HIGH (ref 35–45)
pCO2, Arterial: 51 mm Hg — ABNORMAL HIGH (ref 35–45)
pH, Arterial: 7.231 — ABNORMAL LOW (ref 7.350–7.450)
pH, Arterial: 7.148 — CL (ref 7.350–7.450)
pO2, Arterial: 86 mm Hg (ref 75–108)
pO2, Arterial: 110 mm Hg — ABNORMAL HIGH (ref 75–108)

## 2023-02-08 LAB — PREPARE PLATELETS
Description Blood Bank: 0
Dispense Status Blood Bank: TRANSFUSED

## 2023-02-08 LAB — CBC WITH AUTO DIFFERENTIAL
Basophils %: 2 %
Basophils Absolute: 0 10*3/uL (ref 0.0–0.2)
Eosinophils %: 5 %
Eosinophils Absolute: 0 10*3/uL (ref 0.0–0.7)
Hematocrit: 17.1 % — CL (ref 42.0–52.0)
Hemoglobin: 5.6 g/dL — CL (ref 14.0–18.0)
Lymphocytes %: 67 %
Lymphocytes Absolute: 0.1 10*3/uL — ABNORMAL LOW (ref 1.0–4.8)
MCH: 34.6 pg — ABNORMAL HIGH (ref 27.0–31.3)
MCHC: 32.7 % — ABNORMAL LOW (ref 33.0–37.0)
MCV: 105.6 fL — ABNORMAL HIGH (ref 79.0–92.2)
Monocytes %: 11 %
Monocytes Absolute: 0 10*3/uL — ABNORMAL LOW (ref 0.2–0.8)
Neutrophils %: 15 %
Neutrophils Absolute: 0 10*3/uL — ABNORMAL LOW (ref 1.4–6.5)
PLATELET SLIDE REVIEW: DECREASED
Platelets: 7 10*3/uL — CL (ref 130–400)
RBC: 1.62 M/uL — ABNORMAL LOW (ref 4.70–6.10)
RDW: 21.5 % — ABNORMAL HIGH (ref 11.5–14.5)
WBC: 0.2 10*3/uL — CL (ref 4.8–10.8)

## 2023-02-08 LAB — RESPIRATORY PANEL, MOLECULAR, WITH COVID-19
Adenovirus by PCR: NOT DETECTED
Bordetella parapertussis by PCR: NOT DETECTED
Bordetella pertussis by PCR: NOT DETECTED
Chlamydophilia pneumoniae by PCR: NOT DETECTED
Coronavirus 229E by PCR: NOT DETECTED
Coronavirus HKU1 by PCR: NOT DETECTED
Coronavirus NL63 by PCR: NOT DETECTED
Coronavirus OC43 by PCR: NOT DETECTED
Human Metapneumovirus by PCR: NOT DETECTED
Human Rhinovirus/Enterovirus by PCR: NOT DETECTED
Influenza A by PCR: NOT DETECTED
Influenza B by PCR: NOT DETECTED
Mycoplasma pneumoniae by PCR: NOT DETECTED
Parainfluenza Virus 1 by PCR: NOT DETECTED
Parainfluenza Virus 2 by PCR: NOT DETECTED
Parainfluenza Virus 3 by PCR: NOT DETECTED
Parainfluenza Virus 4 by PCR: NOT DETECTED
Respiratory Syncytial Virus by PCR: NOT DETECTED
SARS-CoV-2, PCR: NOT DETECTED

## 2023-02-08 LAB — COMPREHENSIVE METABOLIC PANEL
ALT: 19 U/L (ref 0–41)
AST: 51 U/L — ABNORMAL HIGH (ref 0–40)
Albumin: 3.7 g/dL (ref 3.5–4.6)
Alkaline Phosphatase: 53 U/L (ref 35–104)
Anion Gap: 13 mEq/L (ref 9–15)
BUN: 20 mg/dL (ref 6–20)
CO2: 19 mEq/L — ABNORMAL LOW (ref 20–31)
Calcium: 9.5 mg/dL (ref 8.5–9.9)
Chloride: 95 mEq/L (ref 95–107)
Creatinine: 1.4 mg/dL — ABNORMAL HIGH (ref 0.70–1.20)
Est, Glom Filt Rate: 64.3 (ref >60)
Globulin: 3.2 g/dL (ref 2.3–3.5)
Glucose: 101 mg/dL — ABNORMAL HIGH (ref 70–99)
Potassium: 3.6 mEq/L (ref 3.4–4.9)
Sodium: 127 mEq/L — ABNORMAL LOW (ref 135–144)
Total Bilirubin: 1.3 mg/dL — ABNORMAL HIGH (ref 0.2–0.7)
Total Protein: 6.9 g/dL (ref 6.3–8.0)

## 2023-02-08 LAB — POCT VENOUS
Est, Glom Filt Rate: 59 — AB (ref >60)
POC Creatinine: 1.5 mg/dL — ABNORMAL HIGH (ref 0.8–1.3)

## 2023-02-08 LAB — SPECIMEN REJECTION

## 2023-02-08 LAB — PROTIME-INR
INR: 1.5
Protime: 17.8 s — ABNORMAL HIGH (ref 12.3–14.9)

## 2023-02-08 LAB — CULTURE, BLOOD 1

## 2023-02-08 LAB — TYPE AND SCREEN
ABO/Rh: O POS
Antibody Screen: NEGATIVE

## 2023-02-08 LAB — MAGNESIUM: Magnesium: 1.4 mg/dL — ABNORMAL LOW (ref 1.7–2.4)

## 2023-02-08 LAB — CULTURE, BLOOD 2

## 2023-02-08 LAB — LACTATE, SEPSIS
Lactic Acid, Sepsis: 2.1 mmol/L — ABNORMAL HIGH (ref 0.5–1.9)
Lactic Acid, Sepsis: 1.4 mmol/L (ref 0.5–1.9)

## 2023-02-08 MED ORDER — PROPOFOL 1000 MG/100ML IV EMUL
1000 | Freq: Once | INTRAVENOUS | Status: AC
Start: 2023-02-08 — End: 2023-02-08

## 2023-02-08 MED ORDER — FENTANYL CITRATE (PF) 100 MCG/2ML IJ SOLN
100 | Freq: Once | INTRAMUSCULAR | Status: AC
Start: 2023-02-08 — End: 2023-02-08
  Administered 2023-02-08: 10:00:00 100 ug via INTRAVENOUS

## 2023-02-08 MED ORDER — FENTANYL CITRATE 1000 MCG/100ML IV SOLN
1000 | INTRAVENOUS | Status: DC
Start: 2023-02-08 — End: 2023-02-08
  Administered 2023-02-08: 11:00:00 25 ug/h via INTRAVENOUS

## 2023-02-08 MED ORDER — SODIUM CHLORIDE 0.9 % IV BOLUS
0.9 | Freq: Once | INTRAVENOUS | Status: AC
Start: 2023-02-08 — End: 2023-02-08
  Administered 2023-02-08: 10:00:00 2397 mL/kg via INTRAVENOUS

## 2023-02-08 MED ORDER — NORMAL SALINE FLUSH 0.9 % IV SOLN
0.9 | INTRAVENOUS | Status: DC | PRN
Start: 2023-02-08 — End: 2023-02-08

## 2023-02-08 MED ORDER — NOREPINEPHRINE-SODIUM CHLORIDE 16-0.9 MG/250ML-% IV SOLN
INTRAVENOUS | Status: DC
Start: 2023-02-08 — End: 2023-02-08
  Administered 2023-02-08: 15:00:00 5 ug/min via INTRAVENOUS

## 2023-02-08 MED ORDER — PROTHROMBIN COMPLEX CONC HUMAN 500 UNITS IV KIT
500 | Freq: Once | INTRAVENOUS | Status: AC
Start: 2023-02-08 — End: 2023-02-08
  Administered 2023-02-08: 13:00:00 2000 [IU] via INTRAVENOUS

## 2023-02-08 MED ORDER — ACETAMINOPHEN 500 MG PO TABS
500 | Freq: Once | ORAL | Status: AC
Start: 2023-02-08 — End: 2023-02-08
  Administered 2023-02-08: 10:00:00 1000 mg via ORAL

## 2023-02-08 MED ORDER — MAGNESIUM SULFATE 2000 MG/50 ML IVPB PREMIX
2 | INTRAVENOUS | Status: DC | PRN
Start: 2023-02-08 — End: 2023-02-08

## 2023-02-08 MED ORDER — SODIUM CHLORIDE 0.9 % IV SOLN
0.9 | Freq: Once | INTRAVENOUS | Status: DC
Start: 2023-02-08 — End: 2023-02-08

## 2023-02-08 MED ORDER — SODIUM CHLORIDE 0.9 % IV BOLUS
0.9 | Freq: Once | INTRAVENOUS | Status: AC
Start: 2023-02-08 — End: 2023-02-08
  Administered 2023-02-08: 13:00:00 1000 mL via INTRAVENOUS

## 2023-02-08 MED ORDER — SODIUM CHLORIDE 0.9 % IV SOLN
0.9 | INTRAVENOUS | Status: DC | PRN
Start: 2023-02-08 — End: 2023-02-08

## 2023-02-08 MED ORDER — ETOMIDATE 2 MG/ML IV SOLN
2 | Freq: Once | INTRAVENOUS | Status: AC
Start: 2023-02-08 — End: 2023-02-08
  Administered 2023-02-08: 10:00:00 20 mg via INTRAVENOUS

## 2023-02-08 MED ORDER — ONDANSETRON HCL 4 MG/2ML IJ SOLN
4 | Freq: Four times a day (QID) | INTRAMUSCULAR | Status: DC | PRN
Start: 2023-02-08 — End: 2023-02-08

## 2023-02-08 MED ORDER — SODIUM CHLORIDE 0.9 % IV SOLN (MINI-BAG)
0.9 | Freq: Once | INTRAVENOUS | Status: AC
Start: 2023-02-08 — End: 2023-02-08
  Administered 2023-02-08: 12:00:00 3375 mg via INTRAVENOUS

## 2023-02-08 MED ORDER — LORAZEPAM 2 MG/ML IJ SOLN
2 | Freq: Once | INTRAMUSCULAR | Status: AC
Start: 2023-02-08 — End: 2023-02-08
  Administered 2023-02-08: 10:00:00 2 mg via INTRAVENOUS

## 2023-02-08 MED ORDER — ONDANSETRON 4 MG PO TBDP
4 | Freq: Three times a day (TID) | ORAL | Status: DC | PRN
Start: 2023-02-08 — End: 2023-02-08

## 2023-02-08 MED ORDER — LACTATED RINGERS IV BOLUS
Freq: Once | INTRAVENOUS | Status: DC
Start: 2023-02-08 — End: 2023-02-08

## 2023-02-08 MED ORDER — ACETAMINOPHEN 650 MG RE SUPP
650 | Freq: Four times a day (QID) | RECTAL | Status: DC | PRN
Start: 2023-02-08 — End: 2023-02-08

## 2023-02-08 MED ORDER — PANTOPRAZOLE SODIUM 40 MG IV SOLR
40 MG | Freq: Two times a day (BID) | INTRAVENOUS | Status: DC
Start: 2023-02-08 — End: 2023-02-08

## 2023-02-08 MED ORDER — POTASSIUM CHLORIDE 10 MEQ/100ML IV SOLN
10 | INTRAVENOUS | Status: DC | PRN
Start: 2023-02-08 — End: 2023-02-08

## 2023-02-08 MED ORDER — SODIUM CHLORIDE 0.9 % IV SOLN
0.9 | INTRAVENOUS | Status: AC
Start: 2023-02-08 — End: 2023-02-08
  Administered 2023-02-08: 14:00:00 250

## 2023-02-08 MED ORDER — IOPAMIDOL 76 % IV SOLN
76 % | INTRAVENOUS | Status: AC | PRN
  Administered 2023-02-08: 11:00:00 75 mL via INTRAVENOUS

## 2023-02-08 MED ORDER — MAGNESIUM SULFATE 2000 MG/50 ML IVPB PREMIX
2 | Freq: Once | INTRAVENOUS | Status: AC
Start: 2023-02-08 — End: 2023-02-08
  Administered 2023-02-08: 12:00:00 2000 mg via INTRAVENOUS

## 2023-02-08 MED ORDER — ACETAMINOPHEN 325 MG PO TABS
325 | Freq: Four times a day (QID) | ORAL | Status: DC | PRN
Start: 2023-02-08 — End: 2023-02-08

## 2023-02-08 MED ORDER — PROPOFOL 1000 MG/100ML IV EMUL
1000 | INTRAVENOUS | Status: DC
Start: 2023-02-08 — End: 2023-02-08

## 2023-02-08 MED ORDER — VANCOMYCIN HCL 1.5 G IV SOLR
1.5 g | Freq: Once | INTRAVENOUS | Status: AC
Start: 2023-02-08 — End: 2023-02-08
  Administered 2023-02-08: 12:00:00 1500 mg via INTRAVENOUS

## 2023-02-08 MED ORDER — POLYETHYLENE GLYCOL 3350 17 G PO PACK
17 | Freq: Every day | ORAL | Status: DC | PRN
Start: 2023-02-08 — End: 2023-02-08

## 2023-02-08 MED ORDER — PROPOFOL 1000 MG/100ML IV EMUL
1000 | INTRAVENOUS | Status: AC
Start: 2023-02-08 — End: 2023-02-08
  Administered 2023-02-08: 10:00:00 20 via INTRAVENOUS

## 2023-02-08 MED ORDER — NORMAL SALINE FLUSH 0.9 % IV SOLN
0.9 | Freq: Two times a day (BID) | INTRAVENOUS | Status: DC
Start: 2023-02-08 — End: 2023-02-08

## 2023-02-08 MED ORDER — PROPOFOL 1000 MG/100ML IV EMUL
1000 | INTRAVENOUS | Status: AC
Start: 2023-02-08 — End: 2023-02-08
  Administered 2023-02-08: 14:00:00 20 via INTRAVENOUS

## 2023-02-08 MED ORDER — SODIUM BICARBONATE 8.4 % IV SOLN
8.4 | Freq: Once | INTRAVENOUS | Status: AC
Start: 2023-02-08 — End: 2023-02-08
  Administered 2023-02-08: 15:00:00 100 meq via INTRAVENOUS

## 2023-02-08 MED ORDER — SUCCINYLCHOLINE CHLORIDE 100 MG/5ML IV SOSY
100 | INTRAVENOUS | Status: AC
Start: 2023-02-08 — End: 2023-02-08
  Administered 2023-02-08: 10:00:00 100 via INTRAVENOUS

## 2023-02-08 MED ORDER — SUCCINYLCHOLINE CHLORIDE 100 MG/5ML IV SOSY
100 | Freq: Once | INTRAVENOUS | Status: AC
Start: 2023-02-08 — End: 2023-02-08

## 2023-02-08 MED FILL — SODIUM CHLORIDE 0.9 % IV SOLN: 0.9 % | INTRAVENOUS | Qty: 1000

## 2023-02-08 MED FILL — NOREPINEPHRINE-SODIUM CHLORIDE 16-0.9 MG/250ML-% IV SOLN: INTRAVENOUS | Qty: 250

## 2023-02-08 MED FILL — ACETAMINOPHEN EXTRA STRENGTH 500 MG PO TABS: 500 MG | ORAL | Qty: 2

## 2023-02-08 MED FILL — MONOJECT FLUSH SYRINGE 0.9 % IV SOLN: 0.9 % | INTRAVENOUS | Qty: 40

## 2023-02-08 MED FILL — DIPRIVAN 1000 MG/100ML IV EMUL: 1000 MG/100ML | INTRAVENOUS | Qty: 100

## 2023-02-08 MED FILL — SODIUM CHLORIDE 0.9 % IV SOLN: 0.9 % | INTRAVENOUS | Qty: 3000

## 2023-02-08 MED FILL — PROTHROMBIN COMPLEX CONC HUMAN 500 UNITS IV KIT: 500 units | INTRAVENOUS | Qty: 80

## 2023-02-08 MED FILL — LORAZEPAM 2 MG/ML IJ SOLN: 2 MG/ML | INTRAMUSCULAR | Qty: 1

## 2023-02-08 MED FILL — SUCCINYLCHOLINE CHLORIDE 100 MG/5ML IV SOSY: 100 MG/5ML | INTRAVENOUS | Qty: 10

## 2023-02-08 MED FILL — SODIUM BICARBONATE 8.4 % IV SOLN: 8.4 % | INTRAVENOUS | Qty: 100

## 2023-02-08 MED FILL — FENTANYL CITRATE (PF) 2500 MCG/50ML IJ SOLN: 2500 MCG/50ML | INTRAMUSCULAR | Qty: 1000

## 2023-02-08 MED FILL — MAGNESIUM SULFATE 2 GM/50ML IV SOLN: 2 GM/50ML | INTRAVENOUS | Qty: 50

## 2023-02-08 MED FILL — FENTANYL CITRATE (PF) 100 MCG/2ML IJ SOLN: 100 MCG/2ML | INTRAMUSCULAR | Qty: 2

## 2023-02-08 MED FILL — VANCOMYCIN HCL 1.5 G IV SOLR: 1.5 g | INTRAVENOUS | Qty: 1500

## 2023-02-08 MED FILL — ETOMIDATE 2 MG/ML IV SOLN: 2 MG/ML | INTRAVENOUS | Qty: 10

## 2023-02-08 MED FILL — PIPERACILLIN SOD-TAZOBACTAM SO 3.375 (3-0.375) G IV SOLR: 3.375 (3-0.375) g | INTRAVENOUS | Qty: 3375

## 2023-02-08 MED FILL — SODIUM CHLORIDE 0.9 % IV SOLN: 0.9 % | INTRAVENOUS | Qty: 250

## 2023-02-08 NOTE — ED Notes (Signed)
Page to hosp @ 800  Dr Wynelle Link returned call @ [phone]

## 2023-02-08 NOTE — Consults (Signed)
Pulmonary and Critical Care Medicine  Consult Note  Encounter Date: 02/08/2023 10:14 AM    [name] is a 42 y.o. male  DOB: 07-22-1980  Requesting Provider: Kathreen Cosier, MD    Reason for request: Ventilator/critical care management          HISTORY OF PRESENT ILLNESS:    Patient is 42 y.o. male who presented to the emergency department with complaints of hemoptysis and shortness of breath.  All information is obtained from the medical record due to the patient currently being intubated and sedated.  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  Other medical history includes right lower extremity DVT, bilateral PE, embolic CVAs, and IVC filter placement.  Per ER documentation the patient has not been feeling well over the past 24 hours and started with hemoptysis early last evening.  He did call EMS and was transferred to the emergency department at that time.  Upon arrival to the emergency department he was noted to be hypoxic and febrile.  Lab work demonstrated significant pancytopenia with white blood cell count critically low at 0.2, platelets critically low at 7000 and hemoglobin of 5.6.  He was started on vancomycin and Zosyn empirically.  He did ultimately require intubation to protect his airway due to worsening work of breathing and desaturation.  He did undergo CT of the chest which demonstrated multiple areas of metastatic disease throughout the lung fields with tumor burden greatest in the left upper lobe.  He was transferred to the intensive care unit for further management.  Pulmonary/critical care has been consulted for critical care management.  Currently the patient is on the ventilator, some mild ventilator dyssynchrony is noted.  He is currently sedated with fentanyl and propofol.  He is awaiting blood products to begin platelet and RBC transfusion.  Due to the patient's significant  cancer history and receiving the majority of his care from [facility] at [facility] a transfer has been initiated by the hospitalist service for the patient to be transferred to [facility].    Past Medical History:    History reviewed. No pertinent past medical history.    Past Surgical History:    History reviewed. No pertinent surgical history.    Social History:     Family History:   No family history on file.    Allergies:  Patient has no known allergies.    MEDICATIONS during current hospitalization:    Continuous Infusions:   sodium chloride      fentaNYL 125 mcg/hr (02/08/23 0843)    norepinephrine      sodium chloride      propofol 20 mcg/kg/min (02/08/23 0949)       Scheduled Meds:    PRN Meds:sodium chloride    REVIEW OF SYSTEMS:  ROS: 10 organs review of system is done including general, psychological, ENT, hematological, endocrine, respiratory, cardiovascular, gastrointestinal, musculoskeletal, neurological,  allergy and Immunology is done and is otherwise negative.    PHYSICAL EXAM:    Vitals:  BP (!) 115/56   Pulse (!) 150   Temp 100.3 F (37.9 C) (Oral)   Resp 16   Ht 1.854 m (6\' 1" )   Wt 136.1 kg (300 lb)   SpO2 97%   BMI 39.58 kg/m     General: Resting on the ventilator.  Some ventilator dyssynchrony noted.  HEENT: Normocephalic, atraumatic.  Pupils equal round  and reactive to light.  Oral ETT/OG in place.  Lungs : Rhonchorous breath sounds noted bilaterally.  No wheezes or rales  Heart:: Sinus tachycardia  ABD: Distended, nontender, no guarding or rigidity to palpation.  Extremities : Right lower extremity with sarcoma present, +1 pitting edema.  Neuro: Sedated on propofol and fentanyl  Skin: No rashes    Data Review  Recent Labs     02/08/23  0530 02/08/23  0738   WBC 0.2*  --    HGB 5.6* 4.1*   HCT 17.1*  --    PLT 7*  --       Recent Labs     02/08/23  0530 02/08/23  0550 02/08/23  0738   NA 127*  --   --    K 3.6  --   --    CL 95  --   --    CO2 19*  --   --    BUN 20   --   --    CREATININE 1.40* 1.5* 1.4*   GLUCOSE 101*  --   --        MV Settings:     Vent Mode: AC/VC  Vt (Set, mL): 450 mL  Resp Rate (Set): 20 bpm  PEEP/CPAP (cmH2O): 5  Peak Inspiratory Pressure (cmH2O): 17 cmH2O  Mean Airway Pressure (cmH2O): 8 cmH20  I:E Ratio: 1:2.3    ABGs:   Recent Labs     02/08/23  0738   PHART 7.231*   PCO2ART 46*   PO2ART 86   HCO3ART 19.4*   BEART -8*   O2SATART 94   TCO2ART 21     O2 Device: Non-rebreather mask  O2 Flow Rate (L/min): 15 L/min  No results found for: "LACTA"    Radiology:    CTA CHEST W WO CONTRAST    Result Date: 02/08/2023  EXAMINATION: CTA OF THE CHEST WITH AND WITHOUT CONTRAST 02/08/2023 7:11 am TECHNIQUE: CTA of the chest was performed before and after the administration of intravenous contrast.  Multiplanar reformatted images are provided for review.  MIP images are provided for review. Automated exposure control, iterative reconstruction, and/or weight based adjustment of the mA/kV was utilized to reduce the radiation dose to as low as reasonably achievable. COMPARISON: None. HISTORY: ORDERING SYSTEM PROVIDED HISTORY: hemoptysis TECHNOLOGIST PROVIDED HISTORY: Reason for exam:->hemoptysis Additional Contrast?->1 What reading provider will be dictating this exam?->CRC FINDINGS: Pulmonary Arteries: Pulmonary arteries are adequately opacified for evaluation.  No evidence of intraluminal filling defect to suggest pulmonary embolism.  Main pulmonary artery is normal in caliber. Mediastinum: No evidence of mediastinal lymphadenopathy.  Thyroid is homogeneous in the tracheal tube in satisfactory position.  Nasogastric tube within the esophagus.  Cardiac chambers are mildly enlarged.  No pericardial effusion.  The heart and pericardium demonstrate no acute abnormality.  There is no acute abnormality of the thoracic aorta. Lungs/pleura: The lungs reveal multiple areas of metastatic disease throughout the lung fields with tumor burden greatest in the left upper lobe. The soft  tissue mass seen anteriorly within the left upper lobe measures 5.7 x 8.2 cm.  There is a pleural base soft tissue mass along the left lateral chest wall axial dimension 6.2 x 3.5 cm.  There is some consolidation identified at the right lower lung field in which infiltrate cannot be excluded.  Soft tissue density medially and posteriorly within the left upper lobe measuring 8.6 by 4.7 cm.  Multiple pulmonary nodule seen scattered throughout the remainder of the  upper and mid lung fields.  Trace left pleural effusion. Upper Abdomen: Limited images of the upper abdomen are unremarkable. Soft Tissues/Bones: No acute bone or soft tissue abnormality.  Minimal multilevel degenerative changes seen within the spine.  No acute chest wall abnormality.  No displaced rib fracture.  Porta catheter identified on the left tip in the SVC.  Incidental gynecomastia bilaterally.  Small sebaceous cyst identified along the upper posterior back just right of midline.     1. No evidence of pulmonary embolism. 2. Multiple areas of metastatic disease throughout the lung fields with tumor burden greatest in the left upper lobe. 3. There is some minimal consolidation identified at the right lower lung field in which infiltrate cannot be excluded. 4. Trace left pleural effusion.     XR CHEST PORTABLE    Result Date: 02/08/2023  EXAMINATION: ONE XRAY VIEW OF THE CHEST 02/08/2023 6:35 am COMPARISON: 02/08/2023 HISTORY: ORDERING SYSTEM PROVIDED HISTORY: post intubation TECHNOLOGIST PROVIDED HISTORY: Reason for exam:->post intubation What reading provider will be dictating this exam?->CRC FINDINGS: Portable chest reveals endotracheal tube in satisfactory position above the carina.  There is improvement seen in the region of the left upper lung field.  Masslike density seen pleural based in appearance along the left upper lobe as well as medially within the left upper lung field.  Nodular density seen scattered throughout the right and left lung  fields suggesting metastatic disease.  Heart is enlarged.  No definite pleural effusion or pneumothorax.     1. Endotracheal tube in satisfactory position above the carina. 2. Improvement seen in the region of the left upper lung field. 3. Masslike density seen pleural based in appearance along the left upper lobe as well as medially within the left upper lung field. 4. Stable nodular density seen scattered throughout the right and left lung fields suggesting metastatic disease.     XR CHEST PORTABLE    Result Date: 02/08/2023  EXAMINATION: ONE XRAY VIEW OF THE CHEST 02/08/2023 5:24 am COMPARISON: None. HISTORY: ORDERING SYSTEM PROVIDED HISTORY: SOB and hempotysis TECHNOLOGIST PROVIDED HISTORY: Reason for exam:->SOB and hempotysis What reading provider will be dictating this exam?->CRC FINDINGS: Portable chest reveals heart to be enlarged.  There is nodular densities identified within the lung fields bilaterally concerning for metastatic disease with ill-defined opacification filling the left upper lung field. Superimposed infiltrate cannot be excluded.  Increased markings as well seen within the left lower lobe.  Porta catheter on the left tip in the SVC.  Bony structures are unremarkable.     Cardiomegaly with nodular densities identified within the lung fields bilaterally concerning for metastatic disease with ill-defined opacification filling the left upper lung field. Superimposed infiltrate cannot be excluded.       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  He did recently receive chemotherapy on 01/28/2023.  The patient does have evidence of metastatic disease to lungs and pleura.  He did present to [facility] with complaints of worsening shortness of breath and hemoptysis.  Due to the patient's complicated cancer history and current treatment being provided at [facility], the patient will be transferred to [facility] for further treatment.  This transfer has been initiated by the hospitalist service.  Patient is currently awaiting bed placement.    Acute hypoxic respiratory failure--he did present to the emergency department with complaints of shortness of breath and hemoptysis.  He does have a  current history of synovial cell sarcoma which she is being treated for at [facility].  The patient did have chemotherapy completed on 01/28/2023.  While in the emergency department he was noted to be hypoxic with SpO2 in the 80s and ultimately required intubation for airway protection.  He is currently on support of the ventilator, FiO2 is currently at 50% with PEEP of 5.  We will continue to wean FiO2 as tolerated to maintain SpO2 greater than 92%.    Hemoptysis--he did present to the emergency department with complaints of hemoptysis.  He does have known metastatic disease to bilateral lungs and pleura.  He was intubated for airway protection in the emergency department.  He does continue on supportive ventilator at this time.    Pancytopenia--he did undergo chemotherapy on 01/28/2023 at the [facility] [place].  White blood cell count was 0.2, platelets were 7000, and hemoglobin was 5.6.  He has received 1 unit of platelets.  He is currently receiving 1 of 2 units of PRBCs.  Will recheck lab work after completion of transfusions.    Nutrition: N.p.o.    Prophylaxis: Mechanical DVT prophylaxis.  Protonix for GI prophylaxis.    Code Status: Full code    This is a 42 y.o. critically ill male from acute hypoxic respiratory failure/pancytopenia/synovial cell sarcoma.  I did spend a total of 46 minutes of critical care time with the patient, and review of the chart, and discussion with the bedside staff.  This time is exclusive of any billable procedures.    Thank you for consultation    Electronically signed by Boris Sharper, APRN - CNP, on 02/08/2023 at 10:14 AM

## 2023-02-08 NOTE — ED Notes (Signed)
Assuming patient care at this time

## 2023-02-08 NOTE — Care Coordination-Inpatient (Signed)
Pt intubated and sedated. ICU nurse here, hand off of care in process between nurses.  It is reported sister was here and left, no other family present.   I will call or assessment information.     Electronically signed by Marsa Aris, RN, BSN on 02/08/2023 at 9:23 AM

## 2023-02-08 NOTE — ED Notes (Addendum)
Per Dr. Seward Carol, okay to up fentanyl at  this time to 75 mcg/h

## 2023-02-08 NOTE — Care Coordination-Inpatient (Signed)
Case Management Assessment  Initial Evaluation    Date/Time of Evaluation: 02/08/2023 11:02 AM  Assessment Completed by: Marsa Aris, RN, BSN    If patient is discharged prior to next notation, then this note serves as note for discharge by case management.    Patient Name: Kyle Howell                   Date of Birth: Jun 05, 1981  Diagnosis: Respiratory failure (HCC) [J96.90]  Septicemia (HCC) [A41.9]  Hemoptysis [R04.2]  Pancytopenia (HCC) [D61.818]  Acute respiratory failure with hypoxia and hypercapnia (HCC) [J96.01, J96.02]                   Date / Time: 02/08/2023  4:55 AM    Patient Admission Status: Inpatient   Readmission Risk (Low < 19, Mod (19-27), High > 27): No data recorded  Current PCP: No primary care provider on file.  PCP verified by CM? Yes    Chart Reviewed: Yes      History Provided by: Child/Family, Medical Record, Physician, Other (see comment) (Kyle Kyle Howell)  Patient Orientation: Unable to Assess, Other (see comment) (INTUBATED & SEDATED PT'S Kyle Kyle Howell WHOM PT LIVES WITH PROVIDED INFORMATION)    Patient Cognition: Other (see comment)    Hospitalization in the last 30 days (Readmission):  No    If yes, Readmission Assessment in CM Navigator will be completed.    Advance Directives:      Code Status: Full Code   Patient's Primary Decision Maker is: Legal Next of Kin (SEE DECISION MAKER NOTE)    Primary Decision Maker: Kyle Howell - Brother/Kyle - [phone]    Secondary Decision Maker: Kyle Howell - Step Child - [phone]    Secondary Decision Maker: Kyle Staggers (ROB) - Brother/Kyle [phone]    Discharge Planning:    Patient lives with: Family Members Type of Home:  HOUSE  Primary Care Giver: Other (Comment)  Patient Support Systems include: Family Members   Current Financial resources: Medicaid  Current community resources: Other (Comment) ([facility])  Current services prior to admission: Other (Comment), Durable Medical Equipment (CHEMO  [facility])            Current DME: Wheelchair, Walker            Type of Home Care services:  None    ADLS  Prior functional level: Independent in ADLs/IADLs  Current functional level: Other (see comment) (INTUBATED & SEDATED IN ER 2' ARF)      Family can provide assistance at DC: Other (comment) (SIBLINGS HAVE BEEN PROVIDING ALL ASSISTANCE NEEDED)  Would you like Case Management to discuss the discharge plan with any other family members/significant others, and if so, who? Yes (Kyle PROVIDED INFORMATION HIPAA CODE GIVEN W ICU CONTACT INFORMATION)  Plans to Return to Present Housing: Unknown at present  Other Identified Issues/Barriers to RETURNING to current housing: [facility] TRANSFER FOR CONTINUITY OF CARE  Potential Assistance needed at discharge: Other (Comment) (BEING TRANSFERRED TO [facility])            Potential DME: Other (Comment) (TBD)  Patient expects to discharge to: Other (comment) (BEING TRANSFERRED TO [facility])  Plan for transportation at discharge:      Financial    Payor: [place] [place] / Plan[place] / Product Type: *No Product type* /     Does insurance require precert for SNF: Yes  Potential assistance Purchasing Medications: No  Meds-to-Beds request:  N/A    No Pharmacies Listed    Notes:    Factors facilitating achievement of predicted outcomes: Family support    Barriers to discharge: Long standing deficits, Medical complications, Medication managment, and TRANSFER INITIATION TO [facility] [place] FOR CONTINUITY OF CARE.     Additional Case Management Notes:   FROM HOME W Kyle AS OF 1/24 TO BE CLOSER TO FAMILY & HELP W NEW DX CA.  [place], SINGLE HAS 2 CHILDREN AGES 10 & 14. BOTH OF HIS PARENTS ARE DECEASED HAS 3 INVOLVED SIBLINGS WHO ARE ALL Howell & ALTERNATES.       BASELINE PER Kyle A&O X 4. AMB INDEPENDENTLY. CAN PERFORM ADL'S NO LONGER DRIVES Kyle TRANSPORTS TO APPTS IN  [place].    IS ON A 21 DAY CYCLE OF CHEMO AT [facility] IN [place]. LAST ROUND WED--TUES (STAYS INPT) & NEXT ROUND DUE 02/18/23. HAS INFUSA PORT.      D/C PLAN TRANSFER IS INITIATED FOR CONTINUITY OF CARE [facility].         The Plan for Transition of Care is related to the following treatment goals of Respiratory failure (HCC) [J96.90]  Septicemia (HCC) [A41.9]  Hemoptysis [R04.2]  Pancytopenia (HCC) [D61.818]  Acute respiratory failure with hypoxia and hypercapnia (HCC) [J96.01, J96.02]    IF APPLICABLE: The Patient and/or patient representative Kyle Howell and his family were provided with a choice of provider and agrees with the discharge plan. Freedom of choice list with basic dialogue that supports the patient's individualized plan of care/goals and shares the quality data associated with the providers was provided to: Patient Representative   Patient Representative Name: Kyle Howell     The Patient and/or Patient Representative Agree with the Discharge Plan? Yes (TRANSFER TO HIGHER LEVEL OF CARE ST [facility] IN [place])    Marsa Aris, RN, BSN  Case Management Department

## 2023-02-08 NOTE — ED Notes (Signed)
Type and screen sent at this time with name date and time

## 2023-02-08 NOTE — ED Notes (Signed)
Dr. Seward Carol aware of blood pressure

## 2023-02-08 NOTE — ED Notes (Signed)
ICU 6 @ 907

## 2023-02-08 NOTE — Other (Addendum)
0930- patient here to icu room 6.     1000- report given to Cyrene Gharibian rn at [facility].    1030- bladder scanned patient for    1033- 1st unit of blood up.    1055- levophed started. Bp 79/34.    1119- second unit of blood up.

## 2023-02-08 NOTE — H&P (Signed)
Hospital Medicine  History and Physical    Patient:  Kyle Howell  MRN: [id number]    CHIEF COMPLAINT:    Chief Complaint   Patient presents with    Hematemesis     Pt arrived via EMS, c/o vomiting blood, hx CA. SPO2 80's, pt placed on NRB in route, pt denies any pain upon arrival.        History Obtained From:  Patient, EMR  Primary Care Physician: No primary care provider on file.    HISTORY OF PRESENT ILLNESS:   The patient is a 42 y.o. 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  Per patient's brother patient has been lethargic for the past day.  He fell down while walking to the bathroom today so the family called for EMS.  Brother noticed some blood in his mouth after the fall.  In the ED, patient was febrile to 104, tachycardic to 150 and hypoxic.  He was pancytopenic with a WBC count of 0.2, hemoglobin of 5.6 and platelets of 7.  CTA chest was negative for PE but showed multiple areas of metastatic disease and possible infiltrate in the right lower lung.  He was intubated in the ED due to hypoxic and hypercarbic respiratory failure.  He was given Kcentra due to concern for hemoptysis and patient is on Eliquis.  Transfer to [facility] was initiated in the ED as patient gets his cancer care at the [facility]  Patient was admitted to ICU    Past medical history-  Metastatic synovial sarcoma  PE  DVT    Allergies:  Patient has no known allergies.    Social History:   TOBACCO:   has no history on file for tobacco use.  ETOH:   has no history on file for alcohol use.      Family History:   No family history on file.    REVIEW OF SYSTEMS:  Ten systems reviewed and negative except for stated in HPI    Physical Exam:    Vitals: BP (!) 115/56   Pulse (!) 150   Temp 100.3 F (37.9  C) (Oral)   Resp 16   Ht 1.854 m (6\' 1" )   Wt 136.1 kg (300 lb)   SpO2 97%   BMI 39.58 kg/m   General appearance: Intubated and sedated  HEENT: Head: Normocephalic, no lesions, without obvious abnormality.  Lungs: Intubated on ventilator, clear to auscultation anteriorly  Heart: Tachycardic, no murmur  Abdomen: soft, non-tender; bowel sounds normal; no masses,  no organomegaly  Extremities: Large tumor on the right lower extremity  Neurologic:Sedated    Recent Labs     02/08/23  0530 02/08/23  0738   WBC 0.2*  --    HGB 5.6* 4.1*   PLT 7*  --      Recent Labs     02/08/23  0530 02/08/23  0550 02/08/23  0738   NA 127*  --   --    K 3.6  --   --    CL 95  --   --    CO2 19*  --   --    BUN 20  --   --    CREATININE 1.40* 1.5* 1.4*   GLUCOSE 101*  --   --  AST 51*  --   --    ALT 19  --   --    BILITOT 1.3*  --   --    ALKPHOS 53  --   --      Troponin T: No results for input(s): "TROPONINI" in the last 72 hours.    ABGs:   Lab Results   Component Value Date/Time    PHART 7.231 02/08/2023 07:38 AM    PO2ART 86 02/08/2023 07:38 AM    PCO2ART 46 02/08/2023 07:38 AM     INR:   Recent Labs     02/08/23  0530   INR 1.5     URINALYSIS:No results for input(s): "NITRITE", "COLORU", "PHUR", "LABCAST", "WBCUA", "RBCUA", "MUCUS", "TRICHOMONAS", "YEAST", "BACTERIA", "CLARITYU", "SPECGRAV", "LEUKOCYTESUR", "UROBILINOGEN", "BILIRUBINUR", "BLOODU", "GLUCOSEU", "AMORPHOUS" in the last 72 hours.    Invalid input(s): "KETONESU"  -----------------------------------------------------------------   CTA CHEST W WO CONTRAST    Result Date: 02/08/2023  EXAMINATION: CTA OF THE CHEST WITH AND WITHOUT CONTRAST 02/08/2023 7:11 am TECHNIQUE: CTA of the chest was performed before and after the administration of intravenous contrast.  Multiplanar reformatted images are provided for review.  MIP images are provided for review. Automated exposure control, iterative reconstruction, and/or weight based adjustment of the mA/kV was utilized to  reduce the radiation dose to as low as reasonably achievable. COMPARISON: None. HISTORY: ORDERING SYSTEM PROVIDED HISTORY: hemoptysis TECHNOLOGIST PROVIDED HISTORY: Reason for exam:->hemoptysis Additional Contrast?->1 What reading provider will be dictating this exam?->CRC FINDINGS: Pulmonary Arteries: Pulmonary arteries are adequately opacified for evaluation.  No evidence of intraluminal filling defect to suggest pulmonary embolism.  Main pulmonary artery is normal in caliber. Mediastinum: No evidence of mediastinal lymphadenopathy.  Thyroid is homogeneous in the tracheal tube in satisfactory position.  Nasogastric tube within the esophagus.  Cardiac chambers are mildly enlarged.  No pericardial effusion.  The heart and pericardium demonstrate no acute abnormality.  There is no acute abnormality of the thoracic aorta. Lungs/pleura: The lungs reveal multiple areas of metastatic disease throughout the lung fields with tumor burden greatest in the left upper lobe. The soft tissue mass seen anteriorly within the left upper lobe measures 5.7 x 8.2 cm.  There is a pleural base soft tissue mass along the left lateral chest wall axial dimension 6.2 x 3.5 cm.  There is some consolidation identified at the right lower lung field in which infiltrate cannot be excluded.  Soft tissue density medially and posteriorly within the left upper lobe measuring 8.6 by 4.7 cm.  Multiple pulmonary nodule seen scattered throughout the remainder of the upper and mid lung fields.  Trace left pleural effusion. Upper Abdomen: Limited images of the upper abdomen are unremarkable. Soft Tissues/Bones: No acute bone or soft tissue abnormality.  Minimal multilevel degenerative changes seen within the spine.  No acute chest wall abnormality.  No displaced rib fracture.  Porta catheter identified on the left tip in the SVC.  Incidental gynecomastia bilaterally.  Small sebaceous cyst identified along the upper posterior back just right of midline.      1. No evidence of pulmonary embolism. 2. Multiple areas of metastatic disease throughout the lung fields with tumor burden greatest in the left upper lobe. 3. There is some minimal consolidation identified at the right lower lung field in which infiltrate cannot be excluded. 4. Trace left pleural effusion.     XR CHEST PORTABLE    Result Date: 02/08/2023  EXAMINATION: ONE XRAY VIEW OF THE CHEST 02/08/2023 6:35 am COMPARISON: 02/08/2023  HISTORY: ORDERING SYSTEM PROVIDED HISTORY: post intubation TECHNOLOGIST PROVIDED HISTORY: Reason for exam:->post intubation What reading provider will be dictating this exam?->CRC FINDINGS: Portable chest reveals endotracheal tube in satisfactory position above the carina.  There is improvement seen in the region of the left upper lung field.  Masslike density seen pleural based in appearance along the left upper lobe as well as medially within the left upper lung field.  Nodular density seen scattered throughout the right and left lung fields suggesting metastatic disease.  Heart is enlarged.  No definite pleural effusion or pneumothorax.     1. Endotracheal tube in satisfactory position above the carina. 2. Improvement seen in the region of the left upper lung field. 3. Masslike density seen pleural based in appearance along the left upper lobe as well as medially within the left upper lung field. 4. Stable nodular density seen scattered throughout the right and left lung fields suggesting metastatic disease.     XR CHEST PORTABLE    Result Date: 02/08/2023  EXAMINATION: ONE XRAY VIEW OF THE CHEST 02/08/2023 5:24 am COMPARISON: None. HISTORY: ORDERING SYSTEM PROVIDED HISTORY: SOB and hempotysis TECHNOLOGIST PROVIDED HISTORY: Reason for exam:->SOB and hempotysis What reading provider will be dictating this exam?->CRC FINDINGS: Portable chest reveals heart to be enlarged.  There is nodular densities identified within the lung fields bilaterally concerning for metastatic disease with  ill-defined opacification filling the left upper lung field. Superimposed infiltrate cannot be excluded.  Increased markings as well seen within the left lower lobe.  Porta catheter on the left tip in the SVC.  Bony structures are unremarkable.     Cardiomegaly with nodular densities identified within the lung fields bilaterally concerning for metastatic disease with ill-defined opacification filling the left upper lung field. Superimposed infiltrate cannot be excluded.       Assessment and Plan     The patient is a 42 y.o. 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  Found to have acute hypoxic hypercarbic respiratory failure and was intubated in ED.  He has profound pancytopenia likely due to chemotherapy.      Acute hypoxic hypercarbic respiratory failure  -Intubated in ED  -CTA chest was negative for PE but showed multiple areas of metastatic disease and possible infiltrate in the right lower lung.  -Received Kcentra in the ED due to concern for hemoptysis  -Critical care consulted.  I personally discussed with Dr. Chase Caller.     Severe pancytopenia  -Secondary to chemotherapy  -1 bag of platelets and 2 unit PRBC has been ordered.    Metastatic sarcoma  -Patient receives his care at the [facility] cancer center  -Last chemotherapy was on 01/28/2023  -Patient will be transferred to [facility]    History of PE and DVT  -Hold Eliquis due to concern for hemoptysis and severe thrombocytopenia.  No major bleeding seen on the ET tube.      Patient Active Problem List   Diagnosis Code    Respiratory failure (HCC) J96.90       Kathreen Cosier, MD, MD  Admitting Hospitalist    TTS: where I focused more than 75% of my attention on rendering care, and planning treatment course for this  patient, in addition to talking to RN team, mid levels, consulting with other  physicians and following up on labs and imaging.    High Risk Readmission Screening Tool Score Noted.     Emergency Contact:

## 2023-02-08 NOTE — ED Provider Notes (Signed)
[facility] ICU  eMERGENCY dEPARTMENT eNCOUnter      Pt Name: Kyle Howell  MRN: [id number]  Birthdate 1980/10/23  Date of evaluation: 02/08/2023  Provider: Recardo Evangelist, MD    CHIEF COMPLAINT       Chief Complaint   Patient presents with    Hematemesis     Pt arrived via EMS, c/o vomiting blood, hx CA. SPO2 80's, pt placed on NRB in route, pt denies any pain upon arrival.          HISTORY OF PRESENT ILLNESS   (Location/Symptom, Timing/Onset,Context/Setting, Quality, Duration, Modifying Factors, Severity)  Note limiting factors.   Cloud Oberbroeckling is a 42 y.o. male who presents to the emergency department for ration of hemoptysis and shortness of breath.  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  He is a poor historian and we do not have records from there.  Patient record shows he was evaluated by [facility] in February of this year but did not follow-up for cancer treatment because of insurance reasons.  He has been treated at the [facility] we believe with chemotherapy he had a port placed in his left upper chest and he is on unknown medications.  He thinks he is on a blood thinner but cannot name it.  Has been feeling worse over the past 24 hours and hemoptysis started then and got worse tonight so he called EMS.  He is brought in hypoxic with hemoptysis and overall not feeling well.  He has high fever.    HPI    NursingNotes were reviewed.    REVIEW OF SYSTEMS    (2-9 systems for level 4, 10 or more for level 5)     Review of Systems   Constitutional:  Positive for fatigue and fever. Negative for chills and diaphoresis.   HENT:  Negative for congestion, ear pain, mouth sores and sore throat.    Eyes:  Negative for photophobia and discharge.   Respiratory:  Positive for cough and shortness of breath. Negative for chest tightness and wheezing.    Cardiovascular:  Negative for chest pain and palpitations.   Gastrointestinal:  Negative for  abdominal distention, abdominal pain, nausea and vomiting.   Endocrine: Negative for cold intolerance.   Genitourinary:  Negative for difficulty urinating.   Musculoskeletal:  Negative for arthralgias.   Skin:  Negative for pallor and rash.   Allergic/Immunologic: Negative for immunocompromised state.   Neurological:  Positive for weakness. Negative for dizziness and syncope.   Hematological:  Negative for adenopathy.   Psychiatric/Behavioral:  Negative for agitation and hallucinations.    All other systems reviewed and are negative.      Except as noted above the remainder of the review of systems was reviewed and negative.       PAST MEDICAL HISTORY   History reviewed. No pertinent past medical history.      SURGICALHISTORY     History reviewed. No pertinent surgical history.      CURRENT MEDICATIONS       There are no discharge medications for this patient.      ALLERGIES     Patient has no known allergies.    FAMILY HISTORY     No family history on file.       SOCIAL HISTORY       Social History     Socioeconomic History    Marital status: Single     Spouse  name: None    Number of children: None    Years of education: None    Highest education level: None       SCREENINGS    Glasgow Coma Scale  Eye Opening: To speech  Best Verbal Response: Confused  Best Motor Response: Localizes pain  Glasgow Coma Scale Score: 12 @FLOW ([id number])@      PHYSICAL EXAM    (up to 7 for level 4, 8 or more for level 5)     ED Triage Vitals [02/08/23 0458]   BP Temp Temp Source Pulse Respirations SpO2 Height Weight - Scale   (!) 110/51 (!) 104.4 F (40.2 C) Axillary (!) 145 28 98 % 1.854 m (6\' 1" ) 136.1 kg (300 lb)       Physical Exam  Vitals and nursing note reviewed.   Constitutional:       General: He is in acute distress.      Appearance: He is well-developed. He is ill-appearing and toxic-appearing.   HENT:      Head: Normocephalic.      Nose: Nose normal.      Mouth/Throat:      Mouth: Mucous membranes are moist.   Eyes:       Conjunctiva/sclera: Conjunctivae normal.      Pupils: Pupils are equal, round, and reactive to light.   Cardiovascular:      Rate and Rhythm: Regular rhythm. Tachycardia present.      Heart sounds: Normal heart sounds.   Pulmonary:      Effort: Respiratory distress present.      Breath sounds: Rhonchi present.   Abdominal:      General: Bowel sounds are normal.      Palpations: Abdomen is soft.      Tenderness: There is no abdominal tenderness. There is no guarding.   Musculoskeletal:         General: Normal range of motion.      Cervical back: Normal range of motion and neck supple.      Right lower leg: Swelling present. Edema present.        Legs:       Comments: Area of synovial sarcoma.  Extremely enlarged and hyperpigmented   Skin:     General: Skin is warm and dry.      Capillary Refill: Capillary refill takes less than 2 seconds.      Coloration: Skin is pale.   Neurological:      Mental Status: He is alert and oriented to person, place, and time.   Psychiatric:         Mood and Affect: Mood normal.         DIAGNOSTIC RESULTS     EKG: All EKG's are interpreted by the Emergency Department Physician who either signs or Co-signsthis chart in the absence of a cardiologist.    EKG shows sinus tachycardia with occasional PVC.  Nonspecific ST changes secondary to rate but no ischemia no STEMI.  Normal axis.  Abnormal EKG.    RADIOLOGY:   Non-plain filmimages such as CT, Ultrasound and MRI are read by the radiologist. Plain radiographic images are visualized and preliminarily interpreted by the emergency physician with the below findings:      Interpretation per the Radiologist below, if available at the time ofthis note:    CTA CHEST W WO CONTRAST   Final Result   1. No evidence of pulmonary embolism.   2. Multiple areas of metastatic disease throughout the lung fields with tumor  burden greatest in the left upper lobe.   3. There is some minimal consolidation identified at the right lower lung   field in which  infiltrate cannot be excluded.   4. Trace left pleural effusion.         XR CHEST PORTABLE   Final Result   1. Endotracheal tube in satisfactory position above the carina.   2. Improvement seen in the region of the left upper lung field.   3. Masslike density seen pleural based in appearance along the left upper   lobe as well as medially within the left upper lung field.   4. Stable nodular density seen scattered throughout the right and left lung   fields suggesting metastatic disease.         XR CHEST PORTABLE   Final Result   Cardiomegaly with nodular densities identified within the lung fields   bilaterally concerning for metastatic disease with ill-defined opacification   filling the left upper lung field. Superimposed infiltrate cannot be excluded.               ED BEDSIDE ULTRASOUND:   Performed by ED Physician - none    LABS:  Labs Reviewed   CBC WITH AUTO DIFFERENTIAL - Abnormal; Notable for the following components:       Result Value    WBC 0.2 (*)     RBC 1.62 (*)     Hemoglobin 5.6 (*)     Hematocrit 17.1 (*)     MCV 105.6 (*)     MCH 34.6 (*)     MCHC 32.7 (*)     RDW 21.5 (*)     Platelets 7 (*)     Neutrophils Absolute 0.0 (*)     Lymphocytes Absolute 0.1 (*)     Monocytes Absolute 0.0 (*)     All other components within normal limits    Narrative:     CALL  [facility] tel. [phone],  Hematology results called to and read back by Florene Route, 02/08/2023 07:35,  by Katheren Puller  WBCI HGB HCT PLT results called to and read back by Florene Route, 02/08/2023  06:06, by Katheren Puller   COMPREHENSIVE METABOLIC PANEL - Abnormal; Notable for the following components:    Sodium 127 (*)     CO2 19 (*)     Glucose 101 (*)     Creatinine 1.40 (*)     Total Bilirubin 1.3 (*)     AST 51 (*)     All other components within normal limits   PROTIME-INR - Abnormal; Notable for the following components:    Protime 17.8 (*)     All other components within normal limits   LACTATE, SEPSIS - Abnormal; Notable for the following components:     Lactic Acid, Sepsis 2.1 (*)     All other components within normal limits   MAGNESIUM - Abnormal; Notable for the following components:    Magnesium 1.4 (*)     All other components within normal limits   POCT ARTERIAL - Abnormal; Notable for the following components:    POC Sodium 128 (*)     POC Potassium 3.3 (*)     POC Chloride 98 (*)     POC Glucose 104 (*)     POC Creatinine 1.4 (*)     Calcium, Ionized 1.41 (*)     pH, Arterial 7.231 (*)     pCO2, Arterial 46 (*)     HCO3, Arterial 19.4 (*)  Base Excess, Arterial -8 (*)     POC Hematocrit 12 (*)     Hemoglobin 4.1 (*)     All other components within normal limits   POCT VENOUS - Abnormal; Notable for the following components:    POC Creatinine 1.5 (*)     Est, Glom Filt Rate 59 (*)     All other components within normal limits   RESPIRATORY PANEL, MOLECULAR, WITH COVID-19   CULTURE, BLOOD 1   CULTURE, BLOOD 2   LACTATE, SEPSIS    Narrative:     Collection has been rescheduled by [facility] at 02/08/2023 08:02 Reason:   Already drawn   SPECIMEN REJECTION    Narrative:     ORDER WAS CANCELLED 02/08/2023 07:05, Rejected: Specimen mislabeled  No time,  date or initals.  TS3C and confirmation drawn at the same time..   CBC   BASIC METABOLIC PANEL   CBC WITH AUTO DIFFERENTIAL   CBC WITH AUTO DIFFERENTIAL   POCT EPOC BLOOD GAS, LACTIC ACID, ICA   POCT EPOC BLOOD GAS, LACTIC ACID, ICA   TYPE AND SCREEN   PREPARE RBC (CROSSMATCH)   PREPARE PLATELETS       All other labs were within normal range or not returned as of this dictation.    EMERGENCY DEPARTMENT COURSE and DIFFERENTIAL DIAGNOSIS/MDM:   Vitals:    Vitals:    02/08/23 0836 02/08/23 0900 02/08/23 0915 02/08/23 0949   BP: (!) 90/45 (!) 106/49 (!) 98/43 (!) 115/56   Pulse: (!) 147 (!) 149 (!) 145 (!) 150   Resp: 18 18 17 16    Temp: (!) 103.6 F (39.8 C)   100.3 F (37.9 C)   TempSrc: Rectal   Oral   SpO2: 100% 95% 100% 97%   Weight:       Height:             ED Course as of 02/08/23 1039   Sun Feb 08, 2023    0520 Lactic Acid [RK]   0758 EKG 12 Lead  EKG showing SVT, rate of 152 bpm.  Normal axis.  Prolonged QTc at 515.  Nonspecific ST-T wave abnormalities likely rate dependent. [NA]      ED Course User Index  [NA] Abou-Arraj, Chase Caller, MD  [RK] Lind Covert, MD     MDM     Amount and/or Complexity of Data Reviewed  Clinical lab tests: reviewed  Tests in the radiology section of CPT: reviewed  Tests in the medicine section of CPT: reviewed    Sister presented to the emergency department and then was able to give little further insight into medical history.  Patient has received approximately 8 chemotherapy treatments at the [facility].  He does live in [place] and goes down to [place] for his treatments.  They were supposed to have follow-up labs this week.  He is on Eliquis    Patient labs are significant for pancytopenia.  White blood count critically low at 0.2.  Platelets critically low at 7000 and hemoglobin is 5.6.    Patient is typed and crossed and transfusions will ensue for blood and platelets.  He is put in reverse isolation and vancomycin and Zosyn started empirically.    I have explained to the sister that the patient is critically ill and I am very concerned he will not make it through this hospitalization.  He is too unstable to transfer.    Patient had to be intubated protect his airway as he  was dropping his sats to the high 80s on 100% oxygen and increased work of breathing    Patient signed out to me at [facility] by Dr. Haynes Bast. Initial evaluation and presentation as documented in their full ED note.   Endrit Goodbar is a 42 y.o. male with a PMH clinically significant for HTN, HLD, Obesity, DVT/PE on Eliquis, and Anemia, Synovial Sarcoma currently undergoing Chemotherapy initially presenting to the ED c/o shortness of breath, hemoptysis, fever and generalized illness.  Hypoxic with increased work of breathing upon arrival requiring intubation.  Thought to be likely septic with fever, sinus  tachycardia and found to be severely neutropenic, anemic and thrombocytopenic.  Platelets and PRBCs ordered in the ED.  Also ordered antibiotics and fluid resuscitation.  Pending: CTA Chest findings and admission to ICU.    ED Course as of 02/08/23 1039   Sun Feb 08, 2023   0520 Lactic Acid [RK]   0758 EKG 12 Lead  EKG showing SVT, rate of 152 bpm.  Normal axis.  Prolonged QTc at 515.  Nonspecific ST-T wave abnormalities likely rate dependent. [NA]      ED Course User Index  [NA] Abou-Arraj, Chase Caller, MD  [RK] Lind Covert, MD     Labs, EKG, and Imaging visualized and interpreted by myself and as noted above.  Given findings, clinical presentation thought most likely consistent w/ sepsis in the setting of absolute neutropenia and severe pancytopenia requiring blood transfusion, platelet transfusion and reversal of anticoagulant in the ED.  Initially appearing to be sinus tachycardia on EKG.  Repeat EKG more concerning for SVT.  Was initiated on appropriate antibiotics.  Appropriate fluid bolus ordered.  Mildly elevated lactic acid upon arrival.  Also noting ABG with hypoxemic, hypercarbic respiratory failure.  CTA with multiple mets and possible superimposed infiltrate concerning for pneumonia.  Ordered Kcentra in the ED given patient on Eliquis.  Blood pressure slowly downtrending.  Did not initiate patient on any rate control given rate was thought to be reactive in the setting of acute infection and anemia.  Did administer magnesium.  Was found to be mildly hypomagnesemic.  Was discussed with internal medicine.  Not appearing to be stable for transfer at this time given tachycardia and hypotension.  Norepinephrine eventually ordered in the ED given persistently declining blood pressure despite fluid resuscitation.  Patient already with Infuse-a-Port.  No indications for additional central venous access at this time.  Placed to external jugular IVs under ultrasound for additional access.  Patient will be  admitted to the ICU.  Also consulted hem/onc, Dr. Renaldo Reel, who was made aware of the patient's condition in the ED and will follow as consulting service.  No additional acute recommendations at this time.  Pt was administered   Medications   0.9 % sodium chloride infusion (has no administration in time range)   fentaNYL (SUBLIMAZE) 1,000 mcg in sodium chloride 0.9% 100 mL infusion (125 mcg/hr IntraVENous Rate/Dose Change 02/08/23 0843)   norepinephrine (LEVOPHED) 16 mg in sodium chloride 0.9 % 250 mL infusion ( IntraVENous Not Given 02/08/23 1009)   prothrombin complex concentrate (human) (KCENTRA) infusion 2,000 Units (0 Units IntraVENous Stopped 02/08/23 0930)     Followed by   0.9 % sodium chloride infusion (50 mLs IntraVENous Bolus from Bag 02/08/23 0951)   propofol infusion (20 mcg/kg/min  136.1 kg IntraVENous New Bag 02/08/23 0949)   sodium chloride flush 0.9 % injection 5-40 mL (has no administration in time range)   sodium chloride flush 0.9 %  injection 5-40 mL (has no administration in time range)   0.9 % sodium chloride infusion (has no administration in time range)   magnesium sulfate 2000 mg in 50 mL IVPB premix (has no administration in time range)   ondansetron (ZOFRAN-ODT) disintegrating tablet 4 mg (has no administration in time range)     Or   ondansetron (ZOFRAN) injection 4 mg (has no administration in time range)   polyethylene glycol (GLYCOLAX) packet 17 g (has no administration in time range)   acetaminophen (TYLENOL) tablet 650 mg (has no administration in time range)     Or   acetaminophen (TYLENOL) suppository 650 mg (has no administration in time range)   potassium chloride 10 mEq/100 mL IVPB (Peripheral Line) (has no administration in time range)   acetaminophen (TYLENOL) tablet 1,000 mg (1,000 mg Oral Given 02/08/23 0530)   sodium chloride 0.9 % bolus 2,397 mL (0 mLs IntraVENous Stopped 02/08/23 0849)   etomidate (AMIDATE) injection 20 mg (20 mg IntraVENous Given 02/08/23 0603)   propofol infusion  (20 mcg/kg/min  136.1 kg IntraVENous Rate/Dose Change 02/08/23 0835)   iopamidol (ISOVUE-370) 76 % injection 75 mL (75 mLs IntraVENous Given 02/08/23 0729)   LORazepam (ATIVAN) injection 2 mg (2 mg IntraVENous Given 02/08/23 0603)   succinylcholine chloride (ANECTINE) injection 100 mg (100 mg IntraVENous Given 02/08/23 0608)   vancomycin (VANCOCIN) 1,500 mg in sodium chloride 0.9 % 500 mL IVPB (ADDAVIAL) (1,500 mg IntraVENous New Bag 02/08/23 0826)   piperacillin-tazobactam (ZOSYN) 3,375 mg in sodium chloride 0.9 % 50 mL IVPB (mini-bag) (0 mg IntraVENous Stopped 02/08/23 0824)   fentaNYL (SUBLIMAZE) injection 100 mcg (100 mcg IntraVENous Given 02/08/23 0626)   sodium chloride 0.9 % bolus 1,000 mL (0 mLs IntraVENous Stopped 02/08/23 0910)   magnesium sulfate 2000 mg in 50 mL IVPB premix (0 mg IntraVENous Stopped 02/08/23 0824)   sodium chloride 0.9 % infusion (250 mLs  New Bag 02/08/23 1011)       Plan: Admit to internal medicine for further evaluation and management.  Report given to Dr. Wynelle Link. Transfer request initiated to [facility] at [facility] in [place].  Report not yet given.  Patient sister understanding and amenable to the POC. Requesting patient remain full code at this time.      Is this patient to be included in the SEP-1 core measure? Yes SEP-1 CORE MEASURE DATA      Sepsis Criteria   Severe Sepsis Criteria   Septic Shock Criteria       Must meet 2:    [x] Temp >100.9 F (38.3 C) or < 96.8 F (36 C)  [x] HR > 90  [] RR > 20  [x] WBC > 12 or < 4 or 10% bands    AND:    [x]  Infection Confirmed or Suspected.     Must meet 1:    [x] Lactate > 2       or   [] Signs of Organ Dysfunction:    - SBP < 90 or MAP < 65  -Creatinine > 2 or increased from baseline  -Urine Output < 0.5 ml/kg/hr  -Bilirubin > 2  -INR > 1.5 (not anticoagulated)  -Platelets < 100,000  -Acute Respiratory Failure as evidenced by new need for NIPPV or mechanical ventilation   Must meet 1:    [] Lactate > 4        or   [x] SBP < 90 or MAP < 65 for  at least two readings in the first hour after fluid bolus administration    []   Vasopressors initiated (if hypotension persists after fluid resuscitation)   Patient Vitals for the past 6 hrs:   BP Temp Pulse Resp SpO2 Height Weight Percent Weight Change   02/08/23 0458 (!) 110/51 (!) 104.4 F (40.2 C) (!) 145 28 98 % 1.854 m (6\' 1" ) 136.1 kg (300 lb) 0   02/08/23 0500 (!) 119/53 -- (!) 148 29 97 % -- -- --   02/08/23 0515 -- -- (!) 153 26 93 % -- -- --   02/08/23 0530 123/77 -- (!) 161 (!) 32 (!) 68 % -- -- --   02/08/23 0549 -- -- (!) 161 28 (!) 88 % -- -- --   02/08/23 0605 103/70 -- (!) 157 26 100 % -- -- --   02/08/23 0610 (!) 164/84 -- (!) 157 21 100 % -- -- --   02/08/23 0625 (!) 122/50 -- (!) 157 29 100 % -- -- --   02/08/23 0655 (!) 110/46 -- (!) 146 16 100 % -- -- --   02/08/23 0735 (!) 116/47 -- (!) 151 18 98 % -- -- --   02/08/23 0740 (!) 117/48 -- (!) 152 18 99 % -- -- --   02/08/23 0744 -- -- (!) 152 17 100 % -- -- --   02/08/23 0745 (!) 112/52 -- (!) 153 17 100 % -- -- --   02/08/23 0750 (!) 101/50 -- (!) 153 17 99 % -- -- --   02/08/23 0815 (!) 100/44 -- (!) 149 18 100 % -- -- --   02/08/23 0836 (!) 90/45 (!) 103.6 F (39.8 C) (!) 147 18 100 % -- -- --   02/08/23 0900 (!) 106/49 -- (!) 149 18 95 % -- -- --   02/08/23 0915 (!) 98/43 -- (!) 145 17 100 % -- -- --   02/08/23 0949 (!) 115/56 100.3 F (37.9 C) (!) 150 16 97 % -- -- --      Recent Labs     02/08/23  0530 02/08/23  0550 02/08/23  0738   WBC 0.2*  --   --    CREATININE 1.40* 1.5* 1.4*   BILITOT 1.3*  --   --    INR 1.5  --   --    PLT 7*  --   --         Fluid Resuscitation Rationale: at least 58mL/kg based on ideal body weight due to obesity defined as BMI >30 (patient's BMI is Body mass index is 39.58 kg/m. and IBW is Ideal body weight: 79.9 kg (176 lb 2.4 oz)  Adjusted ideal body weight: 102.4 kg (225 lb 11 oz))    Repeat lactate level: improving    Reassessment Exam: I have reassessed tissue perfusion and hemodynamic status after fluid  bolus at this date/time: 02/08/23 at 0910      CRITICAL CARE TIME   Total CriticalCare time was 90 minutes, excluding separately reportable procedures.  There was a high probability of clinically significant/life threatening deterioration in the patient's condition which required my urgent intervention.        PROCEDURES:  Unless otherwise noted below, none     Intubation    Date/Time: 02/08/2023 6:31 AM    Performed by: Lind Covert, MD  Authorized by: Lind Covert, MD    Consent:     Consent obtained:  Verbal    Consent given by:  Patient    Risks discussed:  Hypoxia, aspiration and bleeding    Alternatives discussed:  No treatment  Universal protocol:     Patient identity confirmed:  Arm band and verbally with patient  Pre-procedure details:     Indications: airway protection, respiratory distress and respiratory failure      Patient status:  Awake    Look externally: no concerns      Mouth opening - incisor distance:  2 finger widths    Hyoid-mental distance: 2 finger widths      Hyoid-thyroid distance: 2 or more finger widths      Mallampati score:  II    Obstruction: none      Neck mobility: normal      Pharmacologic strategy: RSI      Induction agents:  Etomidate    Paralytics:  Succinylcholine  Procedure details:     Preoxygenation:  Bag valve mask    CPR in progress: no      Number of attempts:  1  Successful intubation attempt details:     Intubation method:  Oral    Intubation technique: direct and video assisted      Laryngoscope blade:  Mac 3    Bougie used: no      Grade view: II      Tube size (mm):  7.5    Tube type:  Cuffed    Tube visualized through cords: yes    Placement assessment:     ETT at teeth/gumline (cm):  25    Tube secured with:  ETT holder    Breath sounds:  Reduced on left    Placement verification: CXR verification and esophageal detector      CXR findings:  Appropriate position  Post-procedure details:     Procedure completion:  Tolerated      FINAL IMPRESSION      1.  Hemoptysis    2. Acute respiratory failure with hypoxia and hypercapnia (HCC)    3. Septicemia (HCC)    4. Pancytopenia (HCC)          DISPOSITION/PLAN   DISPOSITION Admitted 02/08/2023 08:38:46 AM      PATIENT REFERRED TO:  No follow-up provider specified.    DISCHARGE MEDICATIONS:  There are no discharge medications for this patient.         (Please note that portions of this note were completed with a voice recognition program.  Efforts were made to edit the dictations but occasionally words are mis-transcribed.)    Recardo Evangelist, MD (electronically signed)  Attending Emergency Physician         Recardo Evangelist, MD  02/08/23 [phone]

## 2023-02-08 NOTE — ED Notes (Signed)
Dr. Seward Carol okay with increasing fentanyl at this time

## 2023-02-08 NOTE — Other (Signed)
Informed Consent for Blood Component Transfusion Note    I have discussed with the brother the rationale for blood component transfusion; its benefits in treating or preventing fatigue, organ damage, or death; and its risk which includes mild transfusion reactions, rare risk of blood borne infection, or more serious but rare reactions. I have discussed the alternatives to transfusion, including the risk and consequences of not receiving transfusion. The brother had an opportunity to ask questions and had agreed to proceed with transfusion of blood components.    Electronically signed by Kathreen Cosier, MD on 02/08/23 at 10:40 AM EDT

## 2023-02-08 NOTE — ED Notes (Signed)
Per lab, type and screen was rejected that was sent prior to this RN arrival due to not signed by RN who sent labs. Will redraw when patient is back in room. Patient in CT at this time

## 2023-02-08 NOTE — ED Notes (Signed)
Family at bedside. Dr. Seward Carol speaking with family

## 2023-02-08 NOTE — ED Notes (Signed)
Creat 1.5

## 2023-02-08 NOTE — ED Triage Notes (Signed)
Pt arrived via EMS, c/o vomiting blood, hx CA. SPO2 80's, pt placed on NRB in route, pt denies any pain upon arrival.

## 2023-02-08 NOTE — ED Notes (Signed)
Report given to [facility]. Denies questions/needs. Respiratory and tech malyessia transporting patient with ICU RN.

## 2023-02-08 NOTE — Care Coordination-Inpatient (Signed)
02/08/23    From:[place] 1/24 FOR HELP W CARE. AMB & ADL'S INDEPENDENTLY. NO HOME 02 OR [facility].     Admit: RESP FAILURE C/O VOMITING BLOOD & 02 SATS 80"S     PMH: SYNOVIAL SARCOMA--L INFUSA PORT 21 DAY CYCLE CHEMO @ [facility] [place]    Anticipated Discharge Disposition:TRANSFER    Patient Mobility or PT/OT ordered:ICU STATUS    Consults: ONCOLOGY    Clinical: CTA CHEST-MULTIPLE AREAS METASTATIC DX GREATEST ON LUL.     Barriers to Discharge:  TRANSFER INITIATED TO [facility] [place] WAITING ON ACCEPTANCE & PLAN FOR ROUTE OF TRANSPORT --GUARDED CONDITION  ST--140'S--160'S --FIO2--50%-  FEBRILE--104.4--103.6--  FENTANYL/ PROP/ LEVO GTTS  REVERSE ISOLATION--WBC--0.2  2 UPRBC'S--HGB-- 5.6--6.7--  PLATELET TRANS--X 1-- PLTS--7--   BLOOD CULTURES    Assessments: CMI/ ACP DECISION MAKER

## 2023-02-08 NOTE — ACP (Advance Care Planning) (Signed)
Advance Care Planning   Healthcare Decision Maker:    Primary Decision Maker: Laurence Spates Brother/Sister [phone]    Secondary Decision Maker: Rennis Petty - Step Child - [phone]    Secondary Decision Maker: Hermina Staggers (ROB) - Brother/Sister - [phone]    Click here to complete Healthcare Decision Makers including selection of the Healthcare Decision Maker Relationship (ie "Primary").  Today we documented Decision Maker(s) consistent with Legal Next of Kin hierarchy.       Patient intubated and sedated. Per sister Moody Bruins she is primary POA with 2 brothers above listed as alternates, she will provide document when able. Pt unmarried has 2 children in [place]. [place] ages 10 & 73.    Electronically signed by Marsa Aris, RN, BSN on 02/08/2023 at 10:21 AM

## 2023-02-09 LAB — EKG 12-LEAD
Atrial Rate: 80 {beats}/min
Q-T Interval: 324 ms
QRS Duration: 86 ms
QTc Calculation (Bazett): 515 ms
R Axis: 63 degrees
T Axis: 65 degrees
Ventricular Rate: 152 {beats}/min

## 2023-02-09 LAB — PATH CONSULT HEMATOLOGY

## 2023-02-10 LAB — CULTURE, BLOOD ID SENSITIVITY
Culture, Blood Id Sensitivity: POSITIVE — AB
Culture, Blood Id Sensitivity: POSITIVE — AB

## 2023-02-10 LAB — EKG 12-LEAD
Atrial Rate: 148 {beats}/min
P Axis: 63 degrees
P-R Interval: 146 ms
Q-T Interval: 238 ms
QRS Duration: 88 ms
QTc Calculation (Bazett): 373 ms
R Axis: 33 degrees
T Axis: 58 degrees
Ventricular Rate: 148 {beats}/min

## 2023-04-09 NOTE — Progress Notes (Signed)
 FOLLOW UP VISIT -  [facility]    History of Present Illness   Kyle Howell is a 42 y.o. male who presents as a new patient in clinic for continuation of already started chemotherapy for recently diagnosed metastatic synovial cell sarcoma.      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 Initially presented for increasing SOB, found to have RLE DVT and bilateral PE (with LA bland thrombus vs tumor) and had acute decompensation with subsequent transfer to the ICU for acute neurologic change and AMS requiring intubation/sedation (2/9 - 2/13). 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 Started on HD-AIM C1D1 on 2/11 and remained intubated due to concerns that fluid shifts would lead to repeat intubation. S/p extubation, pt had some AMS and could not r/o ifosfamide encephalopathy, so pt was treated with methylene blue and thiamine. Mental status improved to baseline and pt was transferred to the floor. He received the 2nd cycle of AIM chemotherapy 3/4 - 3/9. C2 with 20% dose reduction per Dr. Selene given thrombocytopenia and to reduce risk of neurotoxicity, and added thiamine 100mg  IV q8h.     Treatment History  High Dose AIM  >08/31/22  C1D1 Doxorubicin 25mg /m2 d1-3 + Ifosfamide 2000mg /m2 d1-5 q21 d (completed in MICU)  >09/22/22    C2D1 Doxorubicin 20mg /m2 d1-3 + Ifosfamide 2000mg /m2 d1-4 q21 d (20% DR)  >10/15/22  C3D1 Doxorubicin 20mg /m2 d1-3 + Ifosfamide 2000mg /m2 d1-4 q21 d (20% DR  >11/05/22  C4D1 Doxorubicin 20mg /m2 d1-3 + Ifosfamide 2000mg /m2 d1-4 q21 d (20% DR)  >11/26/22  C5D1 Doxorubicin 20mg /m2 d1-3 + Ifosfamide 2000mg /m2 d1-4 q21 d (20% DR)  >12/17/22  C6D1 Doxorubicin 20mg /m2 d1-3 + Ifosfamide 2000mg /m2 d1-4 q21 d (20% DR)  Ifosfamide/Etoposide  > 01/08/2023 C1D1 ifosfamide 1.8 g/m2/d CIV d1-4 + etoposide 100 mg/m2/d d1-4 q21d (20% DR,  infusional)  > 01/28/2023 C2D1 ifosfamide 1.8 g/m2/d CIV d1-4 + etoposide 100 mg/m2/d d1-4 q21d (20% DR, infusional)    Interval History    Kyle Howell presents today for evaluation following a prolonged hospitalization from 7/21-8/18/2024 for septic shock, bacteremia, AKI, atrial fibrillation with RVR after which he underwent rehabilitation at [facility].      He was just discharged home from Rehabilitation on 2 days ago.    He reports that he is supposed to be doing PT and having his wound dressed at home, but that it hasn't been set up yet.    He feels that he is making progress with PT.  He currently can walk pretty far as long as he is pacing himself.  He is currently using a walker.  He does walk in his room without an assistive device.  No falls.    His leg is still very swollen.  He remains on apixaban 5 mg BID.    He has not had his dressing changed x 2 days.    He reports that he was using oxycodone  5 mg ~twice daily at the facility.    The patient states that overall he feels well.  No fever, chills, headache, visual disturbances.  Appetite is good.  No recent weight change.  No change in bowel or bladder habits.  No chest pain, cough.  No history of bleeding.  No new cutaneous lesions. ROS is otherwise unremarkable.    NGS TESTING  Past Medical History:   Diagnosis Date   . Gout    . Hypertension    . Stroke 08/2022    lethargy, transient L sided weakness. MR very small R parietla infarct, several punctate DWI lesions bilateal frontal, l parietal, L occpital. CTA head, neck no significant stenosis. TTE filling defect LA. CT chest tumor thrombus R ower, L upper pulmonary artery with extension into LA   . Synovial sarcoma      Past Surgical History:   Procedure Laterality Date   . REMOVAL CENTRAL VENOUS ACCESS DEVICE TUNNELED W/ PORT PUMP Left 02/09/2023    Laterality: Left;  Surgeon: Josetta GORMAN Copes, MD;  Location: [facility] INTER[facility] RADIOLOG[facility] (VIR)   . INSERTION CVC  TUNNELED W/ PORT PUMP Left 01/27/2023    Laterality: Left;  Surgeon: Josetta GORMAN Copes, MD;  Location: [facility] INTERVENTION[facility] RADIOLOG[facility] (VIR)   . REMOVAL CENTRAL VENOUS ACCESS DEVICE TUNNELED W/ PORT PUMP Right 12/18/2022    Laterality: Right;  Surgeon: Hildegard DELENA Brilliant, MD;  Location: [facility] RAD[facility] (VIR)   . INSERTION CVC TUNNELED W/ PORT PUMP N/A 09/19/2022    Laterality: N/A;  Surgeon: Lynwood CHRISTELLA Shasta PONCE, MD;  Location: [facility] INTER[facility] RAD[facility]OLOG[facility] (VIR)   . INSERTION CVC NON-TUNNELED N/A 08/29/2022    Laterality: N/A;  Surgeon: Lynwood CHRISTELLA Shasta PONCE, MD;  Location: [facility] INTER[facility] RAD[facility] (VIR)   . INSERTION VENA CAVA FILTER ENDOVASCULAR APPROACH W/ S&I N/A 08/26/2022    Laterality: N/A;  Surgeon: Rupert LITTIE Donalds, MD;  Location: [facility] INTERVENTION[facility] RAD[facility]OLOG[facility] (VIR)     Social History     Socioeconomic History   . Marital status: Single     Spouse name: Not on file   . Number of children: Not on file   . Years of education: Not on file   . Highest education level: Not on file   Occupational History   . Not on file   Tobacco Use   . Smoking status: Never   . Smokeless tobacco: Never   Vaping Use   . Vaping status: Never Used   Substance and Sexual Activity   . Alcohol use: Not Currently   . Drug use: Not Currently   . Sexual activity: Not on file   Other Topics Concern   . Not on file   Social History Narrative   . Not on file     Social Determinants of Health     Financial Resource Strain: Not on file   Food Insecurity: No Food Insecurity (02/13/2023)    Hunger Vital Sign    . Worried About Programme Researcher, Broadcasting/film/video in the Last Year: Never true    . Ran Out of Food in the Last Year: Never true   Recent Concern: Food Insecurity - Food Insecurity Present (02/10/2023)    Hunger Vital Sign    . Worried About Programme Researcher, Broadcasting/film/video in the Last Year: Sometimes true    . Ran Out of Food in the Last Year: Never true   Transportation Needs: No Transportation Needs (02/13/2023)    PRAPARE - Transportation    . Lack  of Transportation (Medical): No    . Lack of Transportation (Non-Medical): No   Physical Activity: Not on file   Stress: Not on file   Social Connections: Not on file   Intimate Partner Violence: Not At Risk (02/13/2023)    Humiliation, Afraid, Rape, and Kick questionnaire    . Fear of Current or Ex-Partner:  No    . Emotionally Abused: No    . Physically Abused: No    . Sexually Abused: No   Housing Stability: Low Risk  (02/13/2023)    Housing Stability Vital Sign    . Unable to Pay for Housing in the Last Year: No    . Number of Places Lived in the Last Year: 1    . Unstable Housing in the Last Year: No     History reviewed. No pertinent family history.   No family status information on file.       Current Medications:  Current Outpatient Medications   Medication Sig   . Acetaminophen  325 MG tablet Take 2 tablets by mouth every 4 hours as needed.   SABRA apixaban 5 MG tablet Take 1 tablet by mouth every 12 hours.   SABRA aspirin 81 MG Chew Tab chewable tablet Chew 1 tablet daily.   . Gabapentin 300 MG capsule Take 2 capsules by mouth 3 times daily. (Patient taking differently: Take 2 capsules by mouth 2 times daily.)   . Lidocaine -prilocaine 2.5-2.5 % cream Apply a thick layer topically 30-60 minutes before needle stick, then cover area.   . Loperamide 2 MG capsule Take 2 capsules by mouth 4 times daily as needed for Diarrhea. 2 stat then 1 cap after each loose stool   . magnesium  oxide 400 (240 Mg) MG Take 2 tablets by mouth 3 (three) times a day.   . oxyCODONE  5 MG tablet Take 1 tablet by mouth every 6 hours as needed for Moderate Pain or Severe Pain for up to 3 days.   SABRA PAZOPanib HCl (Votrient) 200 MG tablet Take 2 tablets by mouth daily. Take on empty stomach (1hr before or 2hr after food)   . Prochlorperazine 10 MG tablet Take 1 tablet by mouth every 6 hours as needed for Nausea / Vomiting.   . Thiamine 100 MG tablet Take 1 tablet by mouth daily.   . Melatonin 3 MG tablet Take 2 tablets by mouth at bedtime as needed  for Insomnia. (Patient not taking: Reported on 04/09/2023)   . Polyethylene glycol 17 g Pack packet Take 1 packet by mouth daily as needed. (Patient not taking: Reported on 04/09/2023)   . Senna 8.6 MG tablet Take 1 tablet by mouth every 12 hours as needed. (Patient not taking: Reported on 04/09/2023)       Allergies:  Patient has no known allergies.    Review of Systems   Full ROS as noted in HPI.    Physical Examination  ECOG performance status:  2  BP 130/78 (BP Position: Sitting)   Pulse 102   Temp 99.1 F (37.3 C) (Oral)   Resp 16   Wt 103.4 kg (227 lb 14.4 oz)   SpO2 100%   BMI 29.25 kg/m   Smoking Status Never    General Appearance:Alert and oriented x3, in no acute distress.   HEENT : Head atraumatic,normocephalic.Pupils equal, round, reactive to light.  No scleral icterus. Mucous membranes moist, no lesions. Alopecia.  Neck: Supple,  no lymphadenopathy or thyromegaly..   Lungs: Diminished breath sounds at the left base.  Normal respiratory effort.  Cardiac:Normal S1, S2 . Rate and rhythm regular. No murmur, gallop or rub. PMI is non-displaced.  GI: Abdomen is soft, nontender, nondistended, positive bowel sounds. No hepatosplenomegaly.   Extremities: No cyanosis.  Massive enlargement of RLE, 3+ edema of RLE.  No clubbing, no edema of LLE.  Neuro: CN 2-12 grossly intact.  Speech is clear and appropriate. Affect is appropriate. Lymph: No cervical,supraclavicular, axillary or inguinal adenopathy    Musculoskeletal: No joint inflammation or swelling (other than right leg).  Psych: Pleasant affect.No sign of agitation .  Skin: No rash,excessive bruising,petechiae. No hand foot symptoms.    Pathology:  07/22/2022    LABS:    Lab Results   Component Value Date    WBC 6.28 04/09/2023    HGB 8.3 (L) 04/09/2023    HCT 27.4 (L) 04/09/2023    PLATELET 175 04/09/2023    MCV 103.8 (H) 04/09/2023       Lab Results   Component Value Date    SODIUM 131 (L) 04/09/2023    POTASSIUM 3.4 (L) 04/09/2023    CHLORIDE 99  04/09/2023    CO2 27 04/09/2023    BUN 4 (L) 04/09/2023    CREATSERUM 0.54 (L) 04/09/2023    GLUCOSE 97 04/09/2023       Lab Results   Component Value Date    ALT 9 (L) 04/09/2023    AST 22 04/09/2023    ALKPHOS 127 (H) 04/09/2023    BILITOTAL 1.2 04/09/2023    BILIDIRECT 0.5 (H) 03/01/2023       Imaging:    Echocardiogram, 02/09/2023  Interpretation Summary  Left ventricular size is normal with mild, global hypokinesis and low normal to mildly reduced systolic function, ejection fraction +/-53%. Abnormal septal motion suggests elevated right heart pressure and volumes.  Left atrium measures mildly enlarged.   Right ventricle is enlarged with low normal to mildly reduced systolic function.  Estimated right ventricular/pulmonary artery systolic pressure may be underestimated, calculated at 30-40mmHg   Right atrium measures mildly enlarged in size  Valve structures are consistent with age. No valve dysfunction   Further study details described below     CT chest, 04/09/2023  IMPRESSION:  1.   Metastatic pulmonary nodules demonstrating somewhat mixed response  compared to the prior exam with slightly increased size of multiple nodules  and slightly decreased size of others.    CT abdomen/pelvis, 04/09/2023  IMPRESSION:  1.  Enlarging right external iliac lymph nodes, concerning for metastatic  disease.   2.  Cirrhosis with splenomegaly. New small splenic infarct.    Assessment:   Kyle Howell is a 42 y.o. male who presents for follow-up for his synovial cell sarcoma of RLE.      Plan:    Synovial sarcoma, RLE  - Metastatic to lungs, pleura.  - Tempus xT NGS testing requested and is pending.  - Currently receiving induction chemotherapy with HD-AIM.  - He is s/p 5 cycles of HD-AIM.    - Dose reduced (20%) and infusional ifosfamide for neurotoxicity after C1.  - He completed 6 cycles of HD-AIM.  - Switched to to IE after completion of 6th cycle of HD-AIM and with goal to treat to plateau.  - Given ICU hospitalization after  C2, will plan to switch to pazopanib maintenance.  - Pazopanib prior authorization sent today.  - He may be a candidate for NY-ESO-1 or MAGEA4 TCR given HLA-A 02:02.  MAGEA4 testing pending.  - CT scans (04/09/2023) reviewed with stable to slight improvement in his pulmonary disease.  - Cleared to start pazopanib 400 mg PO daily x 2 weeks, then increase to 600 mg PO daily.      2. Mediport infection  - ICU for S. Agalactiae bacteremia.  - Mediport removed 02/09/2023.    3. Hypercalcemia, mild  - PTHi mildly elevated (  12/17/2022) to 79.7    4. Anemia  - Hgb 8.3  - HOLD on transfusion today.    5. DVT, massive PE  - S/p IVC filter placement 08/26/22 by IR   - Required lovenox titration to ensure therapeutic level.  - Now on Eliquis 5 mg BID.  - encouraged patient to continue lymphedema wrapping of his leg.    6. Cancer related pain, neuropathy  - currently on gabapentin 600mg  TID with marginal improvement.  - recommend titration of oxycodone  5 mg q6h prn at [facility] to facilitate PT    6. Hx CVA  - Most likely embolic at time of diagnosis.    - Anticoagulated as above for DVT/PE.  - Continue ASA 81 mg PO daily.    7. Deconditioning  - Secondary to recent ICU stay.  - PCRM to assist with home care, home PT, home dressing changes.    8. Wound  - Recommend wound to right lower leg dressing changes with Xeroform dressing after being cleansed with saline.  ABDs applied with Kerlix and Ace wrap    9. Follow up  - RTC 4 weeks with labs prior for toxicity assessment on pazopanib.  - LFTs q2wk on pazopanib.  - CT scans in 12 weeks.      Thank you for the opportunity to participate in the care of Kyle Howell.    Alm DELENA Husband, MD    No orders of the defined types were placed in this encounter.  ]

## 2023-04-17 ENCOUNTER — Inpatient Hospital Stay: Admit: 2023-04-17 | Payer: MEDICAID | Primary: Family Medicine

## 2023-04-17 DIAGNOSIS — C7800 Secondary malignant neoplasm of unspecified lung: Secondary | ICD-10-CM

## 2023-04-17 NOTE — Progress Notes (Signed)
 [facility]   Progress Note and Procedure Note      [name]  MEDICAL RECORD NUMBER:  [id number]  AGE: 42 y.o.   GENDER: male  DOB: March 08, 1981  EPISODE DATE:  04/17/2023    Subjective:     Chief Complaint   Patient presents with    Wound Check         HISTORY of PRESENT ILLNESS HPI     Kyle Howell is a 42 y.o. male who presents today for wound/ulcer evaluation.   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 Patient was diagnosed in January. Following with oncology at [facility] but recently moved back into town and needed local wound care. Still continuing to follow [facility] for treatment. Switched from chemo to oral chemo 2 days ago. Has seen surgical oncology at [facility] in the past, but was not ready for amputation at that time and has not seen them since. Has been getting xeroform dressing changes. Patient currently taking OAC. Has new ulcer that appeared earlier this week on that same leg.   Wound/Ulcer Pain Timing/Severity: mild  Quality of pain: tender  Severity:  5 / 10   Modifying Factors: None  Associated Signs/Symptoms: edema, drainage, and pain    Ulcer Identification:  Ulcer Type:  malignancy   Contributing Factors: anticoagulation therapy      PAST MEDICAL HISTORY        Diagnosis Date    Cancer (HCC)        PAST SURGICAL HISTORY    History reviewed. No pertinent surgical history.    FAMILY HISTORY    History reviewed. No pertinent family history.    SOCIAL HISTORY    Social History     Tobacco Use    Smoking status: Never    Smokeless tobacco: Never   Vaping Use    Vaping status: Never Used   Substance Use Topics    Alcohol use: Not Currently    Drug use: Never       ALLERGIES    No Known Allergies    MEDICATIONS    Current Outpatient Medications on File Prior to Encounter   Medication Sig Dispense Refill    PAZOPanib HCl (VOTRIENT) 200 MG chemo tablet Take 4 tablets by mouth daily      magnesium  oxide (MAG-OX) 400 (240 Mg) MG tablet Take 1 tablet by mouth daily       apixaban (ELIQUIS) 5 MG TABS tablet Take 1 tablet by mouth 2 times daily      gabapentin (NEURONTIN) 400 MG capsule Take 1 capsule by mouth 3 times daily.      aspirin 81 MG chewable tablet Take 1 tablet by mouth daily      ondansetron  (ZOFRAN ) 4 MG tablet Take 1 tablet by mouth every 8 hours as needed for Nausea or Vomiting      oxyCODONE  (ROXICODONE ) 5 MG immediate release tablet Take 1 tablet by mouth every 6 hours as needed for Pain. Max Daily Amount: 20 mg      vitamin B-1 (THIAMINE) 100 MG tablet Take 1 tablet by mouth daily       No current facility-administered medications on file prior to encounter.       REVIEW OF SYSTEMS    Pertinent items are noted in HPI.    Objective:      There were no vitals taken for this visit.    Wt Readings from Last 3 Encounters:   02/08/23  136.1 kg (300 lb)       PHYSICAL EXAM    Constitutional:   Well nourished and well developed.  Appears neat and clean.  Patient is alert, oriented x3, and in no apparent distress.     Respiratory:  Respiratory effort is easy and symmetric bilaterally.  Rate is normal at rest and on room air.    Vascular:  Pedal Pulses are not palpable but are audible signal noted with doppler.      Neurological:  Gross and Light touch intact. Protective sensation intact    Dermatological:  Wound description noted in wound assessment.      Psychiatric:  Judgement and insight intact. Short and long term memory intact.  No evidence of depression, anxiety, or agitation.  Patient is calm, cooperative, and communicative.  Appropriate interactions and affect.      Assessment:      There are no active hospital problems to display for this patient.       Procedure Note  Indications:  Based on my examination of this patient's wound(s)/ulcer(s) today, debridement is not required to promote healing and evaluate the wound base.    Wound 04/17/23 Leg #1 right medial leg (Active)   Wound Image   04/17/23 0953   Dressing/Treatment Xeroform;Dry dressing 04/17/23 1027   Wound  Length (cm) 1.5 cm 04/17/23 0953   Wound Width (cm) 1.5 cm 04/17/23 0953   Wound Depth (cm) 0.1 cm 04/17/23 0953   Wound Surface Area (cm^2) 2.25 cm^2 04/17/23 0953   Wound Volume (cm^3) 0.225 cm^3 04/17/23 0953   Wound Assessment Bleeding 04/17/23 0953   Drainage Amount Large (50-75% saturated) 04/17/23 0953   Drainage Description Sanguinous 04/17/23 0953   Odor None 04/17/23 0953   Peri-wound Assessment Fragile 04/17/23 0953   Margins Undefined edges 04/17/23 0953   Wound Thickness Description not for Pressure Injury Full thickness 04/17/23 0953   Number of days: 0       Wound 04/17/23 Posterior;Right #2 leg (Active)   Wound Image   04/17/23 0953   Dressing/Treatment Xeroform;Dry dressing 04/17/23 1027   Wound Length (cm) 7.5 cm 04/17/23 0953   Wound Width (cm) 5 cm 04/17/23 0953   Wound Surface Area (cm^2) 37.5 cm^2 04/17/23 0953   Wound Assessment Bleeding 04/17/23 0953   Drainage Amount Large (50-75% saturated) 04/17/23 0953   Drainage Description Sanguinous 04/17/23 0953   Odor None 04/17/23 0953   Peri-wound Assessment Fragile;Maceration 04/17/23 0953   Margins Undefined edges 04/17/23 0953   Wound Thickness Description not for Pressure Injury Full thickness 04/17/23 0953   Number of days: 0                  Plan:     No indication for debridement -- malignancy. Placed referral to surgical oncology for second opinion as well as HHC. Will call in saline to pharmacy. Gave patient rx for scooter. Dressing changes as described below. Follow up in 1 week.     Advised to go to the emergency room with any signs or symptoms of infection including fever, chills, lethargy, pain, redness, purulence, odor or warmth.         Treatment Note please see attached Discharge Instructions    Written patient dismissal instructions given to patient and signed by patient or POA.         Discharge Instructions         [facility]   Physician Orders and  Discharge  Instructions  [facility]  [address]  [place], [place]  [address]  Telephone: [phone]      [name] [phone]      NAME:  Kyle Howell          DATE OF BIRTH:  03-18-1981  MEDICAL RECORD NUMBER:  [id number]    Your Case Manager is:  Vernell    Home Care/Facility: A new referral put in to home health care     Wound Location: Right Posterior Leg, Right Medial Leg     Dressing orders:  Cleanse wound with normal saline  Apply Xeroform, ABD pad and a dry dressing   Change daily ( Ok to change every other day if unable to change daily)     Compression: none     Offloading Device:    Other Instructions: A referral was put in today for surgical oncologist in this area.  Continue with Cancer treatments and follow ups at [facility]. Pick up normal saline from the pharmacy.     Keep all dressings clean, dry and intact.  Keep pressure off the wound(s) at all times.     Follow up visit   1 Week Monday April 27, 2023 at 1:45pm    Please give 24 hour notice if unable to keep appointment. [phone]    If you experience any of the following, please call the Wound Care Service at  [phone] or go to the nearest emergency room.   *Increase in pain *Temperature over 101 *Increase in drainage from your wound or a foul odor  *Uncontrolled swelling *Need for compression bandage changes due to slippage, breakthrough drainage       PLEASE NOTE: IF YOU ARE UNABLE TO OBTAIN WOUND SUPPLIES, CONTINUE TO USE THE SUPPLIES YOU HAVE AVAILABLE UNTIL YOU ARE ABLE TO REACH US . IT IS MOST IMPORTANT TO KEEP THE WOUND COVERED AT ALL TIMES    Electronically signed by Isaiah Shape, APRN - CNP on 04/17/2023 at 11:10 AM                    Electronically signed by Isaiah Shape, APRN - CNP on 04/17/2023 at 11:11 AM

## 2023-04-17 NOTE — Discharge Instructions (Addendum)
 [facility] Wound Center and Hyperbaric Medicine   Physician Orders and Discharge Instructions  [facility]  [address]  [place], [place]  [address]  Telephone: [phone]      [name] [phone]      NAME:  Kyle Howell          DATE OF BIRTH:  February 13, 1981  MEDICAL RECORD NUMBER:  [id number]    Your Case Manager is:  Vernell    Home Care/Facility: A new referral put in to home health care     Wound Location: Right Posterior Leg, Right Medial Leg     Dressing orders:  Cleanse wound with normal saline  Apply Xeroform, ABD pad and a dry dressing   Change daily ( Ok to change every other day if unable to change daily)     Compression: none     Offloading Device:    Other Instructions: A referral was put in today for surgical oncologist in this area.  Continue with Cancer treatments and follow ups at [facility]. Pick up normal saline from the pharmacy.     Keep all dressings clean, dry and intact.  Keep pressure off the wound(s) at all times.     Follow up visit   1 Week Monday April 27, 2023 at 1:45pm    Please give 24 hour notice if unable to keep appointment. [phone]    If you experience any of the following, please call the Wound Care Service at  [phone] or go to the nearest emergency room.   *Increase in pain *Temperature over 101 *Increase in drainage from your wound or a foul odor  *Uncontrolled swelling *Need for compression bandage changes due to slippage, breakthrough drainage       PLEASE NOTE: IF YOU ARE UNABLE TO OBTAIN WOUND SUPPLIES, CONTINUE TO USE THE SUPPLIES YOU HAVE AVAILABLE UNTIL YOU ARE ABLE TO REACH US . IT IS MOST IMPORTANT TO KEEP THE WOUND COVERED AT ALL TIMES    Electronically signed by Isaiah Shape, APRN - CNP on 04/17/2023 at 11:10 AM

## 2023-04-22 LAB — COMPREHENSIVE METABOLIC PANEL
Anion Gap: 11 meq/L (ref 9–15)
BUN: 4 mg/dL — ABNORMAL LOW (ref 6–20)
CO2: 26 meq/L (ref 20–31)
Calcium: 10.6 mg/dL — ABNORMAL HIGH (ref 8.5–9.9)
Chloride: 103 meq/L (ref 95–107)
Creatinine: 0.6 mg/dL — ABNORMAL LOW (ref 0.70–1.20)
Est, Glom Filt Rate: >90 (ref >60)
Globulin: 4.3 g/dL — ABNORMAL HIGH (ref 2.3–3.5)
Glucose: 110 mg/dL — ABNORMAL HIGH (ref 70–99)
Potassium: 3.2 meq/L — ABNORMAL LOW (ref 3.4–4.9)
Sodium: 140 meq/L (ref 135–144)

## 2023-04-22 LAB — HEPATIC FUNCTION PANEL
ALT: 9 U/L (ref 0–41)
AST: 24 U/L (ref 0–40)
Albumin: 3.6 g/dL (ref 3.5–4.6)
Alkaline Phosphatase: 120 U/L — ABNORMAL HIGH (ref 35–104)
Bilirubin, Direct: <0.2 mg/dL (ref 0.0–0.4)
Total Bilirubin: 0.6 mg/dL (ref 0.2–0.7)
Total Protein: 7.9 g/dL (ref 6.3–8.0)

## 2023-04-27 ENCOUNTER — Inpatient Hospital Stay: Admit: 2023-04-27 | Payer: MEDICAID | Primary: Family Medicine

## 2023-04-27 DIAGNOSIS — S81801A Unspecified open wound, right lower leg, initial encounter: Secondary | ICD-10-CM

## 2023-04-27 DIAGNOSIS — C499 Malignant neoplasm of connective and soft tissue, unspecified: Secondary | ICD-10-CM

## 2023-04-27 NOTE — Plan of Care (Signed)
Problem: Wound:  Goal: Will show signs of wound healing; wound closure and no evidence of infection  Description: Will show signs of wound healing; wound closure and no evidence of infection  Outcome: Progressing

## 2023-04-27 NOTE — Discharge Instructions (Addendum)
[facility] Wound Center and Hyperbaric Medicine   Physician Orders and Discharge Instructions  [facility]  [address]  [place], [place]  [address]  Telephone: [phone]      Valinda Hoar [phone]        NAME:  Kyle Howell                                                                                                 DATE OF BIRTH:  1980/11/02  MEDICAL RECORD NUMBER:  [id number]     Your Case Manager is:  Fleet Contras     Home Care/Facility: Please call us after you speak with your insurance about what home health agencies they accept and we will fax them your referral.      Wound Location: Right Posterior Leg, Right Medial Leg      Dressing orders:  Cleanse wound with normal saline  Apply Xeroform, ABD pad and a dry dressing   Change daily ( Ok to change every other day if unable to change daily)      Compression: none      Offloading Device:     Other Instructions: Make appointment with surgical oncologist in this area. (Referral given 04/20/2023) Continue with Cancer treatments and follow ups at [facility].      Keep all dressings clean, dry and intact.  Keep pressure off the wound(s) at all times.      Follow up visit   2 Weeks Monday May 11, 2023 at 1:30pm     Please give 24 hour notice if unable to keep appointment. [phone]     If you experience any of the following, please call the Wound Care Service at  [phone] or go to the nearest emergency room.        *Increase in pain*Temperature over 101*Increase in drainage from your wound or a foul odor  *Uncontrolled swelling*Need for compression bandage changes due to slippage, breakthrough drainage       PLEASE NOTE: IF YOU ARE UNABLE TO OBTAIN WOUND SUPPLIES, CONTINUE TO USE THE SUPPLIES YOU HAVE AVAILABLE UNTIL YOU ARE ABLE TO REACH [place]. IT IS MOST IMPORTANT TO KEEP THE WOUND COVERED AT ALL TIMES    Electronically signed by Janice Norrie, APRN - CNP on 04/27/2023 at 2:48 PM

## 2023-04-27 NOTE — Progress Notes (Signed)
[facility] Wound Center and Hyperbaric  Medicine   Physician Orders and Discharge Instructions  [facility]  [address]  [place], [place]  [address]  Telephone: [phone]      Valinda Hoar [phone]        NAME:  Kyle Howell                                                                                                 DATE OF BIRTH:  01-11-1981  MEDICAL RECORD NUMBER:  [id number]     Your Case Manager is:  Fleet Contras     Home Care/Facility: Please call us after you speak with your insurance about what home health agencies they accept and we will fax them your referral.      Wound Location: Right Posterior Leg, Right Medial Leg      Dressing orders:  Cleanse wound with normal saline  Apply Xeroform, ABD pad and a dry dressing   Change daily ( Ok to change every other day if unable to change daily)      Compression: none      Offloading Device:     Other Instructions: Make appointment with surgical oncologist in this area. (Referral given 04/20/2023) Continue with Cancer treatments and follow ups at [facility].      Keep all dressings clean, dry and intact.  Keep pressure off the wound(s) at all times.      Follow up visit   2 Weeks Monday May 11, 2023 at 1:30pm     Please give 24 hour notice if unable to keep appointment. [phone]     If you experience any of the following, please call the Wound Care Service at  [phone] or go to the nearest emergency room.        *Increase in pain*Temperature over 101*Increase in drainage from your wound or a foul odor  *Uncontrolled swelling*Need for compression bandage changes due to slippage, breakthrough drainage       PLEASE NOTE: IF YOU ARE UNABLE TO OBTAIN WOUND SUPPLIES, CONTINUE TO USE THE SUPPLIES YOU HAVE AVAILABLE UNTIL YOU ARE ABLE TO REACH [place]. IT IS MOST IMPORTANT TO KEEP THE WOUND COVERED AT ALL TIMES    Electronically signed by Janice Norrie, APRN - CNP on 04/27/2023 at 2:48 PM             Electronically signed by Janice Norrie, APRN - CNP on 04/27/2023 at 2:48 PM  [facility]   Progress Note and Procedure Note      Kyle Howell  MEDICAL RECORD NUMBER:  [id number]  AGE: 42 y.o.   GENDER: male  DOB: 31-Dec-1980  EPISODE DATE:  04/27/2023    Subjective:     Chief Complaint   Patient presents with    Wound Check         HISTORY of PRESENT ILLNESS HPI     Kyle Howell is a 42 y.o. male who presents today for wound/ulcer evaluation.   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 Patient was diagnosed in January. Following with oncology at [facility] and is on oral chemo. Has seen surgical oncology at [facility] in the past, but was not ready for amputation at that time and has not seen them since. Has been getting xeroform dressing changes. Patient currently taking OAC. Has follow up with oncology next week at [facility].   Wound/Ulcer Pain Timing/Severity: mild  Quality of pain: tender  Severity:  5 / 10   Modifying Factors: None  Associated Signs/Symptoms: edema, drainage, and pain    Ulcer Identification:  Ulcer Type:  malignancy  Contributing Factors: immunosuppression and anticoagulation therapy      PAST MEDICAL HISTORY        Diagnosis Date    Cancer (HCC)        PAST SURGICAL HISTORY    History reviewed. No pertinent surgical history.    FAMILY HISTORY    History reviewed. No pertinent family history.    SOCIAL HISTORY    Social History     Tobacco Use    Smoking status: Never    Smokeless tobacco: Never   Vaping Use    Vaping status: Never Used   Substance Use Topics    Alcohol use: Not Currently    Drug use: Never       ALLERGIES    No Known Allergies    MEDICATIONS    Current Outpatient Medications on File Prior to Encounter   Medication Sig Dispense Refill    PAZOPanib HCl (VOTRIENT) 200 MG chemo tablet Take 4 tablets by mouth daily      magnesium oxide (MAG-OX) 400 (240 Mg) MG tablet Take 1 tablet by mouth daily      apixaban (ELIQUIS) 5 MG TABS tablet Take 1 tablet by mouth 2 times daily      gabapentin (NEURONTIN) 400 MG capsule Take 1 capsule by  mouth 3 times daily.      aspirin 81 MG chewable tablet Take 1 tablet by mouth daily      ondansetron (ZOFRAN) 4 MG tablet Take 1 tablet by mouth every 8 hours as needed for Nausea or Vomiting      oxyCODONE (ROXICODONE) 5 MG immediate release tablet Take 1 tablet by mouth every 6 hours as needed for Pain. Max Daily Amount: 20 mg      vitamin B-1 (THIAMINE) 100 MG tablet Take 1 tablet by mouth daily       No current facility-administered medications on file prior to encounter.       REVIEW OF SYSTEMS    Pertinent items are noted in HPI.    Objective:      BP (!) 143/94   Pulse 86   Temp 96.8 F (36 C) (Temporal)   Resp 18     Wt Readings from Last 3 Encounters:   02/08/23 136.1 kg (300 lb)       PHYSICAL EXAM  [facility] Wound Center and Hyperbaric  Medicine   Physician Orders and Discharge Instructions  [facility]  [address]  [place], [place]  [address]  Telephone: [phone]      Valinda Hoar [phone]        NAME:  Kyle Howell                                                                                                 DATE OF BIRTH:  01-11-1981  MEDICAL RECORD NUMBER:  [id number]     Your Case Manager is:  Fleet Contras     Home Care/Facility: Please call us after you speak with your insurance about what home health agencies they accept and we will fax them your referral.      Wound Location: Right Posterior Leg, Right Medial Leg      Dressing orders:  Cleanse wound with normal saline  Apply Xeroform, ABD pad and a dry dressing   Change daily ( Ok to change every other day if unable to change daily)      Compression: none      Offloading Device:     Other Instructions: Make appointment with surgical oncologist in this area. (Referral given 04/20/2023) Continue with Cancer treatments and follow ups at [facility].      Keep all dressings clean, dry and intact.  Keep pressure off the wound(s) at all times.      Follow up visit   2 Weeks Monday May 11, 2023 at 1:30pm     Please give 24 hour notice if unable to keep appointment. [phone]     If you experience any of the following, please call the Wound Care Service at  [phone] or go to the nearest emergency room.        *Increase in pain*Temperature over 101*Increase in drainage from your wound or a foul odor  *Uncontrolled swelling*Need for compression bandage changes due to slippage, breakthrough drainage       PLEASE NOTE: IF YOU ARE UNABLE TO OBTAIN WOUND SUPPLIES, CONTINUE TO USE THE SUPPLIES YOU HAVE AVAILABLE UNTIL YOU ARE ABLE TO REACH [place]. IT IS MOST IMPORTANT TO KEEP THE WOUND COVERED AT ALL TIMES    Electronically signed by Janice Norrie, APRN - CNP on 04/27/2023 at 2:48 PM             Electronically signed by Janice Norrie, APRN - CNP on 04/27/2023 at 2:48 PM

## 2023-05-08 ENCOUNTER — Ambulatory Visit: Admit: 2023-05-08 | Discharge: 2023-05-08 | Payer: MEDICAID | Attending: Family Medicine | Primary: Family Medicine

## 2023-05-08 DIAGNOSIS — C499 Malignant neoplasm of connective and soft tissue, unspecified: Secondary | ICD-10-CM

## 2023-05-08 DIAGNOSIS — Z13220 Encounter for screening for lipoid disorders: Secondary | ICD-10-CM
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
## 2023-05-11 ENCOUNTER — Encounter: Payer: MEDICAID | Primary: Family Medicine

## 2023-05-11 NOTE — Telephone Encounter (Signed)
Jasmine December of [facility] called and stated that they weren't able to see the patient in the time that was required for the wound care.    Jasmine December was inquiring if this is something the patient can be taught to do himself or if there is someone in the home that can be taught?    Also stated that the order needed to be re-sent and dated for 05/11/2023.    Would like a call back at [phone]

## 2023-05-12 NOTE — Telephone Encounter (Signed)
Please clarify with [facility] home health.  It is a that they are unable to see him due to staff issues or for other reasons?  As per the patient, he does try wrapped his wound himself but it is becoming more difficult for him.  He is also tried to have his sister help however there are times where she is not able to be there.  I will update the home health care order thank you

## 2023-05-12 NOTE — Telephone Encounter (Signed)
Please advise, thank you.

## 2023-05-12 NOTE — Telephone Encounter (Signed)
Spoke with Jasmine December from [facility], notified her with this info. Once referral received, they will try to get to patient for his care

## 2023-05-25 ENCOUNTER — Inpatient Hospital Stay: Admit: 2023-05-25 | Payer: MEDICAID | Primary: Family Medicine

## 2023-05-25 VITALS — BP 138/90 | HR 136 | Temp 97.20000°F | Resp 16

## 2023-05-25 DIAGNOSIS — C499 Malignant neoplasm of connective and soft tissue, unspecified: Secondary | ICD-10-CM

## 2023-05-25 DIAGNOSIS — S81801A Unspecified open wound, right lower leg, initial encounter: Secondary | ICD-10-CM

## 2023-05-25 NOTE — Discharge Instructions (Addendum)
[facility] Wound Center and Hyperbaric Medicine   Physician Orders and Discharge Instructions  [name]  [address]  [place], [place]  [address]  Telephone: [phone]      Valinda Hoar [phone]        NAME:  Kyle Howell                                                                                                 DATE OF BIRTH:  [birth year]  MEDICAL RECORD NUMBER:  [id number]     Your Case Manager is:  Rachel/Yvonne     Home Care/Facility:       Wound Location: Right Posterior Leg, Right Medial Leg      Dressing orders:  Cleanse wound with normal saline  Apply Xeroform, ABD pad and a dry dressing   Change daily ( Ok to change every other day if unable to change daily)      Compression: none      Offloading Device:     Other Instructions: Continue with Cancer treatments and follow ups at [facility].      Keep all dressings clean, dry and intact.  Keep pressure off the wound(s) at all times.      Follow up visit   2 Weeks June 08, 2023 @ 1:30     Please give 24 hour notice if unable to keep appointment. [phone]     If you experience any of the following, please call the Wound Care Service at  [phone] or go to the nearest emergency room.        *Increase in pain*Temperature over 101*Increase in drainage from your wound or a foul odor  *Uncontrolled swelling*Need for compression bandage changes due to slippage, breakthrough drainage       PLEASE NOTE: IF YOU ARE UNABLE TO OBTAIN WOUND SUPPLIES, CONTINUE TO USE THE SUPPLIES YOU HAVE AVAILABLE UNTIL YOU ARE ABLE TO REACH [place]. IT IS MOST IMPORTANT TO KEEP THE WOUND COVERED AT ALL TIMES    Electronically signed by Janice Norrie, APRN - CNP on 05/25/2023 at 3:00 PM

## 2023-05-25 NOTE — Plan of Care (Signed)
Problem: Wound:  Goal: Will show signs of wound healing; wound closure and no evidence of infection  Description: Will show signs of wound healing; wound closure and no evidence of infection  05/25/2023 1423 by Kathrynn Humble, RN  Outcome: Progressing  05/25/2023 1423 by Kathrynn Humble, RN  Outcome: Progressing
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
## 2023-05-25 NOTE — Plan of Care (Signed)
Problem: Wound:  Goal: Will show signs of wound healing; wound closure and no evidence of infection  Description: Will show signs of wound healing; wound closure and no evidence of infection  Outcome: Progressing

## 2023-06-08 ENCOUNTER — Encounter: Payer: MEDICAID | Primary: Family Medicine

## 2023-06-22 ENCOUNTER — Ambulatory Visit
Admit: 2023-06-22 | Discharge: 2023-06-23 | Disposition: A | Discharge status: Home / Self Care | Payer: MEDICAID | Primary: Family Medicine

## 2023-06-22 VITALS — BP 96/68 | HR 102 | Temp 96.80000°F | Resp 17

## 2023-06-22 DIAGNOSIS — S81801A Unspecified open wound, right lower leg, initial encounter: Secondary | ICD-10-CM

## 2023-06-22 NOTE — Other (Signed)
 Wound Care Supplies      Supply Company:     [facility] [address] [place], [place] [address]  p: [phone] f: [phone]     Ordering Center:     [facility]  [address]  [place] [address]  [phone]

## 2023-06-22 NOTE — Plan of Care (Signed)
 Problem: Wound:  Goal: Will show signs of wound healing; wound closure and no evidence of infection  Description: Will show signs of wound healing; wound closure and no evidence of infection  Outcome: Progressing

## 2023-06-22 NOTE — Discharge Instructions (Addendum)
 [facility] Wound Center and Hyperbaric Medicine   Physician Orders and Discharge Instructions  [facility]  [address]  [place], [place]  [address]  Telephone: [phone]      FAX [phone]        NAME:  Kyle Howell

## 2023-06-22 NOTE — Progress Notes (Signed)
 [facility]   Progress Note and Procedure Note      Lavelle Akel  MEDICAL RECORD NUMBER:  [id number]  AGE: 42 y.o.   GENDER: male  DOB: 1981/05/09  EPISODE DATE:  06/22/2023    Subjective:     Chief Complaint   Patient presents with    Wound Check

## 2023-07-03 ENCOUNTER — Encounter: Payer: MEDICAID | Attending: Internal Medicine | Primary: Family Medicine

## 2023-07-06 ENCOUNTER — Inpatient Hospital Stay: Admit: 2023-07-06 | Disposition: A | Discharge status: Home / Self Care | Payer: MEDICAID | Primary: Family Medicine

## 2023-07-06 VITALS — BP 118/82 | HR 113 | Temp 97.30000°F | Resp 18

## 2023-07-06 DIAGNOSIS — C7800 Secondary malignant neoplasm of unspecified lung: Secondary | ICD-10-CM

## 2023-07-06 NOTE — Discharge Instructions (Addendum)
 [facility] and Hyper[facility] Medicine   Physician Orders and Discharge Instructions  [facility]  [address]  [place], [place]  [address]  Telephone: [phone]      FAX [phone]        NAME:  Tyress Loden

## 2023-07-06 NOTE — Progress Notes (Signed)
 [facility] Center   Progress Note and Procedure Note      Kyle Howell  MEDICAL RECORD NUMBER:  [id number]  AGE: 42 y.o.   GENDER: male  DOB: February 25, 1981  EPISODE DATE:  07/06/2023    Subjective:     Chief Complaint   Patient presents with    Wound Chec

## 2023-07-06 NOTE — Plan of Care (Signed)
 Problem: Wound:  Goal: Will show signs of wound healing; wound closure and no evidence of infection  Description: Will show signs of wound healing; wound closure and no evidence of infection  Outcome: Progressing

## 2023-07-17 NOTE — Progress Notes (Signed)
 [facility] OUTREACH [facility]:   Shipping/Pickup:   Patient has affirmed needing a refill of the following medications  for Shipment :    Med Name:  Votrient 200 MG      Contact Info:    Specialty Pharmacy               [phone]

## 2023-07-20 ENCOUNTER — Inpatient Hospital Stay
Admit: 2023-07-20 | Disposition: A | Discharge status: Home / Self Care | Payer: Medicaid (Managed Care) | Primary: Family Medicine

## 2023-07-20 VITALS — BP 164/88 | HR 117 | Temp 97.10000°F | Resp 20

## 2023-07-20 DIAGNOSIS — S81801A Unspecified open wound, right lower leg, initial encounter: Principal | ICD-10-CM

## 2023-07-20 NOTE — Discharge Instructions (Addendum)
 [facility] Wound Center and Hyperbaric Medicine   Physician Orders and Discharge Instructions  [facility]  [address]  [place], [place]  [address]  Telephone: [phone]      FAX [phone]        NAME:  Kyle Howell

## 2023-07-20 NOTE — Progress Notes (Signed)
 [facility] Wound Care Center   Progress Note and Procedure Note      Kyle Howell  MEDICAL RECORD NUMBER:  [id number]  AGE: 42 y.o.   GENDER: male  DOB: 03-May-1981  EPISODE DATE:  07/20/2023    Subjective:     Chief Complaint   Patient presents with    Wound Chec

## 2023-07-20 NOTE — Other (Signed)
 Wound Care Supplies      Supply Company:     [facility] [address] [place], [place] [address]  p: [phone] f: [phone]     Ordering Center:     [facility]  [address]  [place] [address]  [phone]

## 2023-08-04 NOTE — Progress Notes (Signed)
PCRM received call from patient today requesting a refill for his oxycodone. Patient states its a 7/10, sharp, mostly at night. PCRM sent message to the medical team, Marcelino Duster, refilled oxycodone, sent to the [facility] listed in the EMR. PCRM returned call to patient, left generic VM letting patient know that the "medication was sent to [facility] per patient request".     PCRM inquired about getting the patient rescheduled for his CT scans and RTC with Dr. Soyla Dryer. Patient states that unfortunately his sister has been sick and unable to bring him to his appointments. PCRM to have scheduling reach out to patient to get him rescheduled. Patient agreeable but asked that they call tomorrow 08/05/23. PCRM will send scheduling a message.     PCRM inquired how the patient was doing overall. Patient states pretty good, denies any diarrhea/nausea/vomiting.       Patient Care Plan  Care plan(s) not assessed this encounter.     Services being utilized    [facility] Wound Care and Hyperbaric Medicine-following for wound care  [address]  [place], [place] [address]  Tel: [phone]  Fax: [phone]    [facility] dressing supplies-covered $0 co-pay.   [address], [address] [place] [address]  Phone: [phone]  Fax: [phone]    [facility]  [address]  [address]  [place], [place] [address]  Ph: [phone]  Fax: [phone]  Labs hours M-F 6:30am-5:00pm-walk ins    [facility] Medical/[facility]/Hosp bed/ walker  Ph: [phone]  Fax: [phone]    Votrient-JOP specialty Rolly Salter)  Ph: [phone]    [facility]-admitted 03/08/23-discharged 04/07/23  [address]   [place], [place] [address]   Phone: [phone]  Fax: [phone]    [facility]-approved 100%  Ph: [phone]    SA-approved at 100% through 11/10/23  HCAP approved at 100%  [facility]    Patient Care Instructions  LDAs  N/A-mediport removed  02/08/23    Plan for next [facility] outreach  Follow up approximately in the next week to ensure patient has been rescheduled and assess for acute and ongoing case management needs.     Maryan Rued BSN, RN, PCRM  [facility]  Ambulatory [facility] Medical Oncology  Nurse Case Manager  Ph: [phone]    If assistance is needed during evening and weekend hours, please call medical oncology clinic at [phone].

## 2023-08-04 NOTE — Progress Notes (Signed)
Refill sent for oxycodone as requested.  Patient did not come to appointment last week (see PCRM note same day).  If patient fails to come to next visit, will not be able to prescribe any further narcotic analgesics.    I have checked an [place]ARRS report on this patient today and there is no/low risk with the prescribing of a controlled medication based on my review of the report.   Doreene Nest, PA-C

## 2023-08-06 NOTE — Telephone Encounter (Signed)
Called and left a voicemail for patient Kyle Howell and I explained he is scheduled on 2/13 at 12:00pm for ct scans at [facility] center, after scans, labs at 3:00pm and Dr. Soyla Dryer at 4:00pm. Asked patient to call back at [phone] to reschedule.

## 2023-08-10 ENCOUNTER — Inpatient Hospital Stay: Payer: MEDICAID | Primary: Family Medicine

## 2023-08-14 ENCOUNTER — Inpatient Hospital Stay: Admit: 2023-08-14 | Payer: MEDICAID | Primary: Family Medicine

## 2023-08-14 VITALS — BP 131/92 | HR 117 | Temp 98.40000°F | Resp 18

## 2023-08-14 DIAGNOSIS — C499 Malignant neoplasm of connective and soft tissue, unspecified: Secondary | ICD-10-CM

## 2023-08-14 DIAGNOSIS — C7800 Secondary malignant neoplasm of unspecified lung: Secondary | ICD-10-CM

## 2023-08-14 MED ORDER — DAKINS (1/4 STRENGTH) 0.125 % EX SOLN
0.125 | Freq: Every day | CUTANEOUS | 2 refills | Status: AC
Start: 2023-08-14 — End: ?

## 2023-08-14 NOTE — Other (Signed)
Wound Care Supplies      Supply Company:     [facility] [address] [place], [place] [address]  p: [phone] f: [phone]     Ordering Center:     [facility]  [address]  [place] [address]  [phone]  WOUND CARE [phone]  FAX NUMBER [phone]    Patient Information:      Kyle Howell  [address]  [place] [address]   [phone]   DOB: 01/25/1981  AGE: 43 y.o.     GENDER: male   TODAYS DATE:  08/14/2023    Insurance:      PRIMARY INSURANCE:  [place]  [place]  Effective Date: 01/19/2023  [id number] - (Medicaid Managed)    SECONDARY INSURANCE:  Plan:   Coverage:   Effective Date:   @SECINSGROUPNUM @    @SECINSID @   @SECINSGROUPNUM @     Patient Wound Information:      Problem List Items Addressed This Visit          Other    Carcinoma of synovia (HCC) - Primary    Relevant Medications    sodium hypochlorite (DAKINS) 0.125 % SOLN external solution    Other Relevant Orders    Initiate Outpatient Wound Care Protocol    Open wound of right lower leg    Relevant Medications    sodium hypochlorite (DAKINS) 0.125 % SOLN external solution    Other Relevant Orders    Initiate Outpatient Wound Care Protocol       WOUNDS REQUIRING DRESSING SUPPLIES:     Wound 04/17/23 Leg #1 right medial leg (Active)   Wound Image   08/14/23 1054   Wound Etiology Other 08/14/23 1054   Wound Cleansed Cleansed with saline 08/14/23 1054   Dressing/Treatment Xeroform;Dry dressing 07/06/23 1506   Wound Length (cm) 2.1 cm 08/14/23 1054   Wound Width (cm) 6 cm 08/14/23 1054   Wound Depth (cm) 2.7 cm 08/14/23 1054   Wound Surface Area (cm^2) 12.6 cm^2 08/14/23 1054   Change in Wound Size % (l*w) -460 08/14/23 1054   Wound Volume (cm^3) 34.02 cm^3 08/14/23 1054   Wound Healing % -15020 08/14/23 1054   Wound Assessment Slough;Pink/red;Eschar moist 08/14/23 1054   Drainage Amount Large (50-75% saturated) 08/14/23 1054   Drainage Description Serosanguinous  08/14/23 1054   Odor Malodorous/putrid 08/14/23 1054   Peri-wound Assessment Intact 08/14/23 1054   Margins Defined edges 08/14/23 1054   Wound Thickness Description not for Pressure Injury Full thickness 08/14/23 1054   Number of days: 119       Wound 04/17/23 Posterior;Right #2 leg (Active)   Wound Image   08/14/23 1054   Wound Etiology Other 08/14/23 1054   Wound Cleansed Cleansed with saline 08/14/23 1054   Dressing/Treatment Xeroform;Dry dressing 07/20/23 1430   Wound Length (cm) 11.5 cm 08/14/23 1054   Wound Width (cm) 14.5 cm 08/14/23 1054   Wound Depth (cm) 0.1 cm 08/14/23 1054   Wound Surface Area (cm^2) 166.75 cm^2 08/14/23 1054   Change in Wound Size % (l*w) -344.67 08/14/23 1054   Wound Volume (cm^3) 16.675 cm^3 08/14/23 1054   Wound Healing % -107 08/14/23 1054   Wound Assessment Dusky;Hyper granulation tissue 08/14/23 1054   Drainage Amount Large (50-75% saturated) 08/14/23 1054   Drainage Description Brown;Black 08/14/23 1054   Odor Malodorous/putrid 08/14/23 1054   Peri-wound Assessment Intact 08/14/23 1054   Margins Undefined edges  08/14/23 1054   Wound Thickness Description not for Pressure Injury Full thickness 08/14/23 1054   Number of days: 119          Supplies Requested :      WOUND #: 1 and 2   PRIMARY DRESSING:  Calcium alginate with silver  SECONDARY DRESSING:     Cover and Secure with: 4X4 non woven gauze pad  ABD pad, wrap with cling/kerlix     FREQUENCY OF DRESSING CHANGES:  Every other day, daily if needed       ADDITIONAL ITEMS:  []  Gloves Small  []  Gloves Medium [x]  Gloves Large []  Gloves XLarge  []  Tape 1" [x]  Tape 2" []  Tape 3"  []  Medipore Tape  [x]  Saline  []  Skin Prep   []  Adhesive Remover   []  Cotton Tip Applicators   []  Other:    Patient Wound(s) Debrided: []  Yes   [x]  No    Debribement Type:     Debridement Date:     Patient currently being seen by Home Health: []  Yes   [x]  No    Duration for needed supplies:  [] 15  [x] 30  [] 60  [] 90 Days    Provider Information:       PROVIDER'S NAME:  Janice Norrie, NP   NWG:[id number]
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
## 2023-08-14 NOTE — Discharge Instructions (Addendum)
[facility] Wound Center and Hyperbaric Medicine   Physician Orders and Discharge Instructions  [facility]  [address]  [place], [place]  [address]  Telephone: [phone]      FAX [phone]        NAME:  Kyle Howell                                                                                                 DATE OF BIRTH:  Jun 17, 1981  MEDICAL RECORD NUMBER:  [id number]     Your Case Manager is: Myrene Buddy or [place]     Home Care/Facility:  None     Wound Location: Right Posterior Leg, Right Medial Leg      Dressing orders:  Cleanse wound with Dakins  Apply Calcium alginate with silver to wounds  Cover with a 4x4, ABD, wrap with cling/kerlix  Change every other day, daily if needed     Compression: none      Offloading Device:      Other Instructions: Continue with Cancer treatments and follow up at [facility] in February 2025, pick up Dakins from your pharmacy     Keep all dressings clean, dry and intact.  Keep pressure off the wound(s) at all times.      Follow up visit   2 Weeks February 7 , 2025 @ 1130am     Please give 24 hour notice if unable to keep appointment. [phone]     If you experience any of the following, please call the Wound Care Service at  [phone] or go to the nearest emergency room.        *Increase in pain*Temperature over 101*Increase in drainage from your wound or a foul odor  *Uncontrolled swelling*Need for compression bandage changes due to slippage, breakthrough drainage       PLEASE NOTE: IF YOU ARE UNABLE TO OBTAIN WOUND SUPPLIES, CONTINUE TO USE THE SUPPLIES YOU HAVE AVAILABLE UNTIL YOU ARE ABLE TO REACH [place]. IT IS MOST IMPORTANT TO KEEP THE WOUND COVERED AT ALL TIMES    Electronically signed by Janice Norrie, APRN - CNP on 08/14/2023 at 11:29 AM

## 2023-08-28 ENCOUNTER — Encounter: Payer: MEDICAID | Primary: Family Medicine

## 2023-08-31 ENCOUNTER — Inpatient Hospital Stay: Admit: 2023-08-31 | Payer: MEDICAID | Attending: Registered Nurse | Primary: Family Medicine

## 2023-08-31 VITALS — BP 111/85 | HR 128 | Temp 96.90000°F | Resp 18

## 2023-08-31 DIAGNOSIS — C7800 Secondary malignant neoplasm of unspecified lung: Secondary | ICD-10-CM
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
## 2023-08-31 NOTE — Discharge Instructions (Addendum)
[facility] W[facility] Center and Hyperbaric Medicine   Physician Orders and Discharge Instructions  [facility]  [address]  [place], [place]  [address]  Telephone: [phone]      FAX [phone]        NAME:  [name]                                                                                                 DATE OF BIRTH:  [birth year]  MEDICAL RECORD NUMBER:  [id number]     Your Case Manager is: Myrene Buddy or [place]     Home Care/Facility:  None     Wound Location: Right Posterior Leg, Right Medial Leg      Dressing orders:  Cleanse wound with Dakins  Apply Xeroform to wounds then cover with a Superabsorber then:  Cover with a dry dressing  Change every other day, daily if needed     Compression: none      Offloading Device:      Other Instructions: Continue with Cancer treatments and follow up at [facility] in September 03, 2023.       Keep all dressings clean, dry and intact.  Keep pressure off the wound(s) at all times.      Follow up visit   2 Weeks September 14, 2023 @ 2:15     Please give 24 hour notice if unable to keep appointment. [phone]     If you experience any of the following, please call the Wound Care Service at  [phone] or go to the nearest emergency room.        *Increase in pain*Temperature over 101*Increase in drainage from your wound or a foul odor  *Uncontrolled swelling*Need for compression bandage changes due to slippage, breakthrough drainage       PLEASE NOTE: IF YOU ARE UNABLE TO OBTAIN WOUND SUPPLIES, CONTINUE TO USE THE SUPPLIES YOU HAVE AVAILABLE UNTIL YOU ARE ABLE TO REACH [place]. IT IS MOST IMPORTANT TO KEEP THE WOUND COVERED AT ALL TIMES

## 2023-08-31 NOTE — Progress Notes (Signed)
[facility] Wound Center Physician Billing Sheet.     Kyle Howell  AGE: 43 y.o.   GENDER: male  DOB: 1980/08/09  TODAY'S DATE:  09/30/23    ICD-10 CODES  There are no active hospital problems to display for this patient.      PHYSICIAN PROCEDURES    CPT CODE  [address]      Electronically signed by Wynelle Beckmann, APRN - NP on 09-30-2023 at 2:44 PM

## 2023-09-03 NOTE — Progress Notes (Signed)
 Patient seen independently of Resident    I have seen and examined this patient independently. I have reviewed the patient's vital signs, nursing notes, review of systems, medications, physical exam findings, laboratory tests, pertinent radiographic imaging, and problem list. I provided a substantive portion of the care for this patient. I personally performed all aspects of the medical decision making for this encounter. I have reviewed and verified this documentation, and it accurately reflects our care.  I also have spoken with the patient and answered all the patient's questions to the best of my ability.    Kyle Howell is a 43 y.o. 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  He is currently on 600 mg PO daily.    He reports that his appetite has stabilized and he has not had any further weight loss.  He is following up with wound care locally.  He does think that the leg swelling is better than last time.  He does still have increased drainage from the tumor on his leg, with increased odor.  See photos (today) for appearance of fungating wound.  He continues to drink EtOH regularly.    I recommended continuing his pazopanib to 600 mg PO daily.  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    Referrals placed to GI for EGD given concern for cirrhosis (screen for varicose veins).  Referral also placed to Addiction Medicine for assistance with EtOH detox.  Metronidazole prescribed for topical management of wound/odor.   He will RTC 2 months with labs, restaging CT scans prior (chest, abdomen/pelvis, R tib/fib).    Thank you for the opportunity to participate in the care of Kyle Howell.    Cliffton Asters. Soyla Dryer, MD  Attending Physician, Medical Oncology

## 2023-09-03 NOTE — Progress Notes (Signed)
 PCRM seen patient in clinic today. Patient states that he is doing pretty good overall. Biggest issue he is having is pain/discomfort mainly at night time, has been using melatonin and oxycodone.   Patient states that his wound supplies shipment was got thrown off a little bit so he needed a few supplies to get him through for a few days until his supply arrives ~09/05/23, he believes.     Patient denies any needs at this time.     Patient Care Plan  Care plan(s) assessed this encounter. Please see care plan documentation.     Services being utilized    [facility]-following for wound care  [address]  [place], [place] [address]  Tel: [phone]  Fax: [phone]    [facility] dressing supplies-covered $0 co-pay.   [address], [address], [place] [address]  Phone: [phone]  Fax: [phone]    [facility] -- [facility]  [address]  [address]  [place], [place] [address]  Ph: [phone]  Fax: [phone]  Labs hours M-F 6:30am-5:00pm-walk ins    [facility]Hosp bed/ walker  Ph: [phone]  Fax: [phone]    [facility] specialty Rolly Salter)  Ph: [phone]    [facility]-admitted 03/08/23-discharged 04/07/23  [address]   [place], [place] [address]   Phone: [phone]  Fax: [phone]    [facility]-approved 100%  Ph: [phone]    SA-approved at 100% through 11/10/23  HCAP approved at 100%  [facility]    Patient Care Instructions  LDAs  N/A-mediport removed 02/08/23    Plan for next [facility] outreach  Follow up approximately in the next few weeks for care plan monitoring and assess for acute and ongoing case management needs.     Maryan Rued BSN, RN, PCRM  [facility]  [facility] Medical Oncology  Nurse Case Manager  Ph: [phone]    If assistance is needed during evening and weekend hours, please call medical oncology clinic at [phone].

## 2023-09-03 NOTE — Progress Notes (Signed)
 FOLLOW UP VISIT -  [facility]    History of Present Illness   Kyle Howell is a 43 y.o. male who presents as a new patient in clinic for continuation of already started chemotherapy for recently diagnosed metastatic synovial cell sarcoma.      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 Initially presented for increasing SOB, found to have RLE DVT and bilateral PE (with LA bland thrombus vs tumor) and had acute decompensation with subsequent transfer to the ICU for acute neurologic change and AMS requiring intubation/sedation (2/9 - 2/13). 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 Started on HD-AIM C1D1 on 2/11 and remained intubated due to concerns that fluid shifts would lead to repeat intubation. S/p extubation, pt had some AMS and could not r/o ifosfamide encephalopathy, so pt was treated with methylene blue and thiamine. Mental status improved to baseline and pt was transferred to the floor. He received the 2nd cycle of AIM chemotherapy 3/4 - 3/9. C2 with 20% dose reduction per Dr. Soyla Dryer given thrombocytopenia and to reduce risk of neurotoxicity, and added thiamine 100mg  IV q8h.     Treatment History  High Dose AIM  >08/31/22  C1D1 Doxorubicin 25mg /m2 d1-3 + Ifosfamide 2000mg /m2 d1-5 q21 d (completed in MICU)  >09/22/22    C2D1 Doxorubicin 20mg /m2 d1-3 + Ifosfamide 2000mg /m2 d1-4 q21 d (20% DR)  >10/15/22  C3D1 Doxorubicin 20mg /m2 d1-3 + Ifosfamide 2000mg /m2 d1-4 q21 d (20% DR  >11/05/22  C4D1 Doxorubicin 20mg /m2 d1-3 + Ifosfamide 2000mg /m2 d1-4 q21 d (20% DR)  >11/26/22  C5D1 Doxorubicin 20mg /m2 d1-3 + Ifosfamide 2000mg /m2 d1-4 q21 d (20% DR)  >12/17/22  C6D1 Doxorubicin 20mg /m2 d1-3 + Ifosfamide 2000mg /m2 d1-4 q21 d (20% DR)  Ifosfamide/Etoposide  > 01/08/2023 C1D1 ifosfamide 1.8 g/m2/d CIV d1-4 + etoposide 100 mg/m2/d d1-4 q21d (20% DR,  infusional)  > 01/28/2023 C2D1 ifosfamide 1.8 g/m2/d CIV d1-4 + etoposide 100 mg/m2/d d1-4 q21d (20% DR, infusional) - discontinued due to repeated hospitalizations/PS - required ICU stay.  Pazopanib  >~04/18/23 Started pazopanib 400mg  PO daily  >05/06/23 Increased to 600mg  PO daily    Interval History    Kyle Howell presents today for follow-up visit while on pazopanib 600mg  daily.    He reports that overall he is tolerating it well.    He has not had an diarrhea, blood pressure improved and has not lost any further weight.    He admits to still drinking about 5-7 drinks per week. ( 5 vodka drinks/shots+ 2x 24 ounce beers). Last drink was 3 days ago.    He continues to follow with wound care team locally (sees about every other week).    The patient states that overall he feels well.  No fever, chills, headache, visual disturbances.  No recent weight change.  No change in bowel or bladder habits.  No chest pain, cough.  No history of bleeding.  No new cutaneous lesions. ROS is otherwise unremarkable.    NGS TESTING              Past Medical History:   Diagnosis Date   . Gout    . Hypertension    . Stroke 08/2022    lethargy, transient L sided weakness. MR very small R parietla infarct, several punctate DWI lesions bilateal frontal, l parietal, L occpital. CTA head, neck no significant stenosis. TTE filling defect  LA. CT chest tumor thrombus R ower, L upper pulmonary artery with extension into LA   . Synovial sarcoma      Past Surgical History:   Procedure Laterality Date   . REMOVAL CENTRAL VENOUS ACCESS DEVICE TUNNELED W/ PORT PUMP Left 02/09/2023    Laterality: Left;  Surgeon: Standley Brooking, MD;  Location: [facility] ([facility])   . INSERTION CVC TUNNELED W/ PORT PUMP Left 01/27/2023    Laterality: Left;  Surgeon: Standley Brooking, MD;  Location: [facility] ([facility]IR)   . REMOVAL CENTRAL VENOUS ACCESS DEVICE TUNNELED W/ PORT PUMP Right 12/18/2022    Laterality: Right;  Surgeon: Ancil Boozer, MD;  Location: [facility] ([facility])   . INSERTION CVC TUNNELED W/ PORT PUMP N/A 09/19/2022    Laterality: N/A;  Surgeon: Rose Phi, MD;  Location: [facility] ([facility]IR)   . INSERTION CVC NON-TUNNELED N/A 08/29/2022    Laterality: N/A;  Surgeon: Rose Phi, MD;  Location: [facility] ([facility])   . INSERTION VENA CAVA FILTER ENDOVASCULAR APPROACH W/ S&I N/A 08/26/2022    Laterality: N/A;  Surgeon: Gwenyth Bouillon, MD;  Location: [facility] ([facility])     Social History     Socioeconomic History   . Marital status: Single     Spouse name: Not on file   . Number of children: Not on file   . Years of education: Not on file   . Highest education level: Not on file   Occupational History   . Not on file   Tobacco Use   . Smoking status: Never   . Smokeless tobacco: Never   Vaping Use   . Vaping status: Never Used   Substance and Sexual Activity   . Alcohol use: Not Currently     Alcohol/week: 3.0 standard drinks of alcohol     Types: 3 Shots of liquor per week   . Drug use: Not Currently   . Sexual activity: Not on file   Other Topics Concern   . Not on file   Social History Narrative   . Not on file     Social Drivers of Health     Financial Resource Strain: Low Risk  (05/08/2023)    Received from [facility]    Overall Physicist, medical Strain (CARDIA)    . Difficulty of Paying Living Expenses: Not hard at all   Food Insecurity: No Food Insecurity (05/08/2023)    Received from [facility]    Hunger Vital Sign    . Worried About Programme researcher, broadcasting/film/video in the Last Year: Never true    . Ran Out of Food in the Last Year: Never true   Recent Concern: Food Insecurity - Food Insecurity Present (02/10/2023)    Hunger Vital Sign    . Worried About Programme researcher, broadcasting/film/video in the Last Year: Sometimes true    . Ran Out of Food in the Last Year: Never true   Transportation Needs: Unknown (05/08/2023)     Received from [facility]    PRAPARE - Transportation    . Lack of Transportation (Medical): Not on file    . Lack of Transportation (Non-Medical): No   Physical Activity: Not on file   Stress: Not on file   Social Connections: Not on file   Intimate Partner Violence: Not At Risk (02/13/2023)  Humiliation, Afraid, Rape, and Kick questionnaire    . Fear of Current or Ex-Partner: No    . Emotionally Abused: No    . Physically Abused: No    . Sexually Abused: No   Housing Stability: Low Risk  (02/13/2023)    Housing Stability Vital Sign    . Unable to Pay for Housing in the Last Year: No    . Number of Places Lived in the Last Year: 1    . Unstable Housing in the Last Year: No     History reviewed. No pertinent family history.   No family status information on file.     Current Medications:  Current Outpatient Medications   Medication Sig   . Acetaminophen 325 MG tablet Take 2 tablets by mouth every 4 hours as needed.   Marland Kitchen apixaban 5 MG tablet Take 1 tablet by mouth every 12 hours.   Marland Kitchen aspirin 81 MG Chew Tab chewable tablet Chew 1 tablet daily.   . Bismuth Tribromoph-Petrolatum (Xeroform Petrolatum Dres 4"x4") 3 % Pads Apply 1 Pad topically daily.   . Gabapentin 300 MG capsule Take 2 capsules by mouth 2 times daily.   . Gauze Pads & Dressings (Abdominal Pad) 8"X10" Pads 1 Pad by Unknown route daily.   . Gauze Pads & Dressings (Kerlix Gauze Roll Medium) Misc 1 Units by Unknown route daily.   . Multiple Vitamin tablet Take 1 tablet by mouth daily.   Marland Kitchen oxyCODONE 5 MG tablet Take 1 tablet by mouth every 6 hours as needed for Moderate Pain or Severe Pain for up to 15 days.   Marland Kitchen PAZOPanib HCl (Votrient) 200 MG tablet Take 3 tablets by mouth daily. Take on empty stomach (1hr before or 2hr after food).   . Polyethylene glycol 17 g Pack packet Take 1 packet by mouth daily as needed.   . Prochlorperazine 10 MG tablet Take 1 tablet by mouth every 6 hours as needed for Nausea / Vomiting.   .  Lidocaine-prilocaine 2.5-2.5 % cream Apply a thick layer topically 30-60 minutes before needle stick, then cover area. (Patient not taking: Reported on 09/03/2023)   . Loperamide 2 MG capsule Take 2 capsules by mouth 4 times daily as needed for Diarrhea. 2 stat then 1 cap after each loose stool (Patient not taking: Reported on 09/03/2023)   . magnesium oxide 400 (240 Mg) MG Take 1 tablet by mouth daily. (Patient not taking: Reported on 09/03/2023)   . Melatonin 3 MG tablet Take 2 tablets by mouth at bedtime as needed for Insomnia. (Patient not taking: Reported on 09/03/2023)     Allergies:  Patient has no known allergies.    Review of Systems   Full ROS as noted in HPI.    Physical Examination  ECOG performance status:  1  BP 128/85 (BP Location: Left arm, BP Position: Sitting)   Temp 98.7 F (37.1 C) (Oral)   Resp 16   Wt 100.2 kg (221 lb)   SpO2 97%   BMI 26.90 kg/m   Smoking Status Never    General Appearance:Alert and oriented x3, in no acute distress.   HEENT : Head atraumatic,normocephalic.Pupils equal, round, reactive to light.  No scleral icterus. Mucous membranes moist, no lesions.   Neck: Supple,  no lymphadenopathy or thyromegaly.  Lungs: Diminished breath sounds at the left base.  Normal respiratory effort.  Cardiac:Normal S1, S2 . Rate and rhythm regular. No murmur, gallop or rub.   GI: Abdomen is soft, nontender, nondistended, positive  bowel sounds. No hepatosplenomegaly.   Extremities: No cyanosis.  Massive enlargement of RLE, 3+ edema of RLE See media tab for most updated photos of RLE fungating wounds - did not remove dressing this visit.  No clubbing, no edema of LLE.  Neuro: CN 2-12 grossly intact. Speech is clear and appropriate. Affect is appropriate. Lymph: No cervical,supraclavicular, axillary or inguinal adenopathy    Musculoskeletal: No joint inflammation or swelling (other than right leg).  Psych: Pleasant affect.No sign of agitation .  Skin: see extremity exam above.  No rash,excessive  bruising,petechiae. No hand foot symptoms.    Pathology:  07/22/2022    LABS:    Lab Results   Component Value Date    WBC 4.65 09/03/2023    HGB 9.8 (L) 09/03/2023    HCT 31.4 (L) 09/03/2023    PLATELET 172 09/03/2023    MCV 121.7 (H) 09/03/2023       Lab Results   Component Value Date    SODIUM 132 (L) 09/03/2023    POTASSIUM 3.6 09/03/2023    CHLORIDE 98 09/03/2023    CO2 26 09/03/2023    BUN 3 (L) 09/03/2023    CREATSERUM 0.63 (L) 09/03/2023    GLUCOSE 95 09/03/2023       Lab Results   Component Value Date    ALT 14 09/03/2023    AST 26 09/03/2023    ALKPHOS 112 09/03/2023    BILITOTAL 2.1 (H) 09/03/2023    BILIDIRECT 0.5 (H) 03/01/2023       Imaging:    Echocardiogram, 02/09/2023  Interpretation Summary  Left ventricular size is normal with mild, global hypokinesis and low normal to mildly reduced systolic function, ejection fraction +/-53%. Abnormal septal motion suggests elevated right heart pressure and volumes.  Left atrium measures mildly enlarged.   Right ventricle is enlarged with low normal to mildly reduced systolic function.  Estimated right ventricular/pulmonary artery systolic pressure may be underestimated, calculated at 30-72mmHg   Right atrium measures mildly enlarged in size  Valve structures are consistent with age. No valve dysfunction   Further study details described below     CT chest, 04/09/2023  IMPRESSION:  1.   Metastatic pulmonary nodules demonstrating somewhat mixed response  compared to the prior exam with slightly increased size of multiple nodules  and slightly decreased size of others.    CT abdomen/pelvis, 09/03/23  IMPRESSION:  1.  Interval decrease in size of prominent right external iliac and inguinal  lymph nodes seen on 04/09/2023 CT.     2.  Interval resolution of splenomegaly, with evolution of a small splenic  infarct.     3.  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    Assessment:   Kyle Howell is a 44 y.o. male who presents for  follow-up for his synovial cell sarcoma of RLE.      Plan:    Synovial sarcoma, RLE  - Metastatic to lungs, pleura.  - Tempus xT NGS testing requested and is pending.  - Received 6 cycles of induction chemotherapy with HD-AIM.  - Switched to to IE after completion of 6th cycle of HD-AIM and with goal to treat to plateau.  - Given ICU hospitalization after C2, switched to pazopanib maintenance.  - Labs reviewed - cleared to continue pazopanib at 600mg  daily.  - Saw Dr. Huntley Dec in BMT in 11/24 as he was evaluated for NY-ESO-1 or MAGEA4 TCR given HLA-A 02:02.  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  -Pt was referred to addiction medicine to further discuss options for alcohol cessation, not a candidate for naltrexone due to opiate use   -EGD on 4/13 for varicose vein screen    2. 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  Identified on CT scan on 2/13.   -Patient was educated to continue Eliquis 5 mg BID daily.    3. Mediport infection (resolved)  - ICU for S. Agalactiae bacteremia.  - Mediport removed 02/09/2023.    4. Hypercalcemia, mild  - PTHi mildly elevated (12/17/2022) to 79.7    5. DVT, massive PE  - S/p IVC filter placement 08/26/22 by IR   - Required lovenox titration to ensure therapeutic level.  - Now on Eliquis 5 mg BID.    6. Cancer related pain, neuropathy  - currently on gabapentin 600mg  BID and oxycodone 5mg  every 6 hours as needed.    7. Hx CVA  - Most likely embolic at time of diagnosis.    - Anticoagulated as above for DVT/PE.  - Continue ASA 81 mg PO daily.     8. Wound, fungating tumor  -metronidazole powder prescribed today  - following with local wound care team.  Currently doing dressing changes with Xeroform dressing after being cleansed with saline.  ABDs applied with Kerlix and Ace wrap    9. Follow up  - RTC 2 months with labs, restaging CT scans prior (chest, abdomen/pelvis, R tib/fib)     Thank  you for the opportunity to participate in the care of Kyle Howell.    Assessment and plan was discussed with Dr. Soyla Dryer, MD, attending in clinic today.    Andrey Spearman, MD  PGY-2 Internal Medicine

## 2023-09-04 NOTE — Telephone Encounter (Signed)
 Scheduled the patient per the AVS from 09/03/23:          Due to scan availability; CT's scheduled on 11/11/23 at 10:00 AM at [facility]    Lab/Dr. Soyla Dryer Follow-up: 11/11/23 at 1:00/2:00 PM    I called the patient and left a detailed voicemail about these appointments. I sent the updated itinerary in the mail to the patient. I will follow-up next week to confirm the appointment details with the patient.

## 2023-09-08 NOTE — Telephone Encounter (Signed)
 I called and spoke to the patient. I reviewed his upcoming appointments scheduled on 11/11/23. The patient requested that I call his sister, his transportation, to confirm the appointment details with her. I called the sister, Moody Bruins, and left a detailed voicemail with the appointment details including date, times and locations. I left CB# [phone] for any questions.

## 2023-09-14 ENCOUNTER — Inpatient Hospital Stay: Admit: 2023-09-14 | Payer: MEDICAID | Primary: Family Medicine

## 2023-09-14 VITALS — BP 112/48 | HR 129 | Temp 97.30000°F | Resp 18

## 2023-09-14 DIAGNOSIS — D849 Immunodeficiency, unspecified: Secondary | ICD-10-CM

## 2023-09-14 DIAGNOSIS — C499 Malignant neoplasm of connective and soft tissue, unspecified: Secondary | ICD-10-CM

## 2023-09-14 MED ORDER — METRONIDAZOLE POWD
Freq: Every day | 3 refills | Status: DC
Start: 2023-09-14 — End: 2024-01-01

## 2023-09-14 MED ORDER — DAKINS (1/4 STRENGTH) 0.125 % EX SOLN
0.125 | Freq: Every day | CUTANEOUS | 3 refills | Status: AC
Start: 2023-09-14 — End: ?

## 2023-09-14 NOTE — Other (Signed)
 Wound Care Supplies      Supply Company:     [facility] [address] [place], [place] [address]  p: [phone] f: [phone]     Ordering Center:     [facility]  [address]  [place] [address]  [phone]  WOUND CARE [phone]  FAX NUMBER [phone]    Patient Information:      Kyle Howell  [address]  [place] [address]   [id number]   DOB: [birth year]  AGE: 43 y.o.     GENDER: male   TODAYS DATE:  09/28/2023    Insurance:      PRIMARY INSURANCE:  Plan[place]  [place]  Effective Date: 01/19/2023  [id number] - (Medicaid Managed)    SECONDARY INSURANCE:  Plan:   Coverage:   Effective Date:   @SECINSGROUPNUM @    @SECINSID @   @SECINSGROUPNUM @     Patient Wound Information:      Problem List Items Addressed This Visit          Musculoskeletal and Integument    Carcinoma of synovia (HCC) - Primary    Relevant Medications    metroNIDAZOLE POWD powder    sodium hypochlorite (DAKINS) 0.125 % SOLN external solution       WOUNDS REQUIRING DRESSING SUPPLIES:     Wound 04/17/23 Leg #1 right medial leg (Active)   Wound Image   09/14/23 1432   Wound Etiology Other 09/14/23 1432   Wound Cleansed Cleansed with saline 09/14/23 1432   Dressing/Treatment Xeroform;Dry dressing 09/14/23 1508   Wound Length (cm) 1.8 cm 09/14/23 1432   Wound Width (cm) 5.5 cm 09/14/23 1432   Wound Depth (cm) 2.5 cm 09/14/23 1432   Wound Surface Area (cm^2) 9.9 cm^2 09/14/23 1432   Change in Wound Size % (l*w) -340 09/14/23 1432   Wound Volume (cm^3) 24.75 cm^3 09/14/23 1432   Wound Healing % -10900 09/14/23 1432   Wound Assessment Slough;Dusky;Pink/red 09/14/23 1432   Drainage Amount Copious (>75 % saturated) 09/14/23 1432   Drainage Description Serosanguinous 09/14/23 1432   Odor Malodorous/putrid 09/14/23 1432   Peri-wound Assessment Intact 09/14/23 1432   Margins Undefined edges 09/14/23 1432   Wound Thickness Description not for Pressure Injury Full thickness  09/14/23 1432   Number of days: 164       Wound 04/17/23 Posterior;Right #2 leg (Active)   Wound Image   09/14/23 1432   Wound Etiology Other 09/14/23 1432   Wound Cleansed Cleansed with saline 09/14/23 1432   Dressing/Treatment Xeroform;Dry dressing 09/14/23 1508   Wound Length (cm) 11.5 cm 09/14/23 1432   Wound Width (cm) 14.5 cm 09/14/23 1432   Wound Depth (cm) 0.1 cm 09/14/23 1432   Wound Surface Area (cm^2) 166.75 cm^2 09/14/23 1432   Change in Wound Size % (l*w) -344.67 09/14/23 1432   Wound Volume (cm^3) 16.675 cm^3 09/14/23 1432   Wound Healing % -107 09/14/23 1432   Wound Assessment Slough;Dusky;Pink/red;Hyper granulation tissue 09/14/23 1432   Drainage Amount Copious (>75 % saturated) 09/14/23 1432   Drainage Description Serosanguinous 09/14/23 1432   Odor Malodorous/putrid 09/14/23 1432   Peri-wound Assessment Intact 09/14/23 1432   Margins Undefined edges 09/14/23 1432   Wound Thickness Description not for Pressure Injury Full thickness 09/14/23 1432   Number of days: 164          Supplies Requested :      WOUND #: 1 and 2  PRIMARY DRESSING:  Other: Xeroform  SECONDARY DRESSING:  Other: superabsorber   Cover and Secure with: ABD pad  Bulky roll gauze     FREQUENCY OF DRESSING CHANGES:  Daily             ADDITIONAL ITEMS:  []  Gloves Small  [x]  Gloves Medium []  Gloves Large []  Gloves XLarge  []  Tape 1" [x]  Tape 2" []  Tape 3"  []  Medipore Tape  [x]  Saline  []  Skin Prep   []  Adhesive Remover   []  Cotton Tip Applicators   []  Other:    Patient Wound(s) Debrided: [x]  Yes   []  No    Debribement Type:  mechanical    Debridement Date: 09/14/23    Patient currently being seen by Home Health: []  Yes   [x]  No    Duration for needed supplies:  [] 15  [x] 30  [] 60  [] 90 Days    Provider Information:      PROVIDER'S NAME:  Janice Norrie NP                  NPI: [id number]

## 2023-09-14 NOTE — Discharge Instructions (Addendum)
 [facility]   Physician Orders and Discharge Instructions  [facility]  [address]  [place], [place]  [address]  Telephone: [phone]      FAX [phone]        NAME:  Kyle Howell                                                                                                 DATE OF BIRTH:  1981/03/16  MEDICAL RECORD NUMBER:  [id number]     Your Case Manager is: Myrene Buddy or [facility] Care/Facility:  None     Wound Location: Right Posterior Leg, Right Medial Leg      Dressing orders:  Cleanse wound with Dakins solution. Sprinkle the wound bed with Metronidazole.   Cover with Xeroform to wounds then cover with a Superabsorber then:  Cover with a dry dressing  Change every other day, daily if needed     Compression: none      Offloading Device:      Other Instructions: Continue with [facility] for treatments. An substance abuse referral was made today.  .Please pick up your Metronidazole and Dakin's solutions  prescription at your pharmacy and take/use as prescribed.      Keep all dressings clean, dry and intact.  Keep pressure off the wound(s) at all times.      Follow up visit   3 Weeks  October 05, 2023 @ 1:30     Please give 24 hour notice if unable to keep appointment. [phone]     If you experience any of the following, please call the Wound Care Service at  [phone] or go to the nearest emergency room.        *Increase in pain*Temperature over 101*Increase in drainage from your wound or a foul odor  *Uncontrolled swelling*Need for compression bandage changes due to slippage, breakthrough drainage       PLEASE NOTE: IF YOU ARE UNABLE TO OBTAIN WOUND SUPPLIES, CONTINUE TO USE THE SUPPLIES YOU HAVE AVAILABLE UNTIL YOU ARE ABLE TO REACH [place]. IT IS MOST IMPORTANT TO KEEP THE WOUND COVERED AT ALL TIMES    Electronically signed by Janice Norrie, APRN - CNP on 09/14/2023 at 3:24 PM
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
## 2023-09-16 NOTE — Telephone Encounter (Signed)
 I called the patient and left a voicemail with the CB# [phone] to get scheduled for an upper endoscopy at his soonest convenience.

## 2023-09-24 NOTE — Progress Notes (Signed)
 PCRM attempted to call patient to follow up on symptoms and complete re-assessment, unfortunately unable to contact patient, LVM requesting a return call.       Patient Care Plan  Care plan(s) assessed this encounter. Please see care plan documentation.     Services being utilized    [facility]-following for wound care  [address]  [place], [place] [address]  Tel: [phone]  Fax: [phone]    [facility] dressing supplies-covered $0 co-pay.   [address]  [place], [place] [address]  Phone: [phone]  Fax: [phone]    [facility]  [address]  [address]  [place], [place] [address]  Ph: [phone]  Fax: [phone]  Labs hours M-F 6:30am-5:00pm-walk ins    [facility] Medical/[facility]/Hosp bed/ walker  Ph: [phone]  Fax: [phone]    [facility] specialty Rolly Salter)  Ph: [phone]    [facility]-admitted 03/08/23-discharged 04/07/23  [address]   [place], [place] [address]   Phone: [phone]  Fax: [phone]    [facility]-approved 100%  Ph: [phone]    SA-approved at 100% through 11/10/23  HCAP approved at 100%  [facility]    Patient Care Instructions  LDAs  N/A-mediport removed 02/08/23    Plan for next [facility] outreach  Follow up approximately in the next few weeks for care plan monitoring, patient education as needed, complete reassessment, and assess for acute and ongoing case management needs.     Maryan Rued BSN, RN, PCRM  [facility]  Nurse Case Manager  Ph: [phone]    If assistance is needed during evening and weekend hours, please call medical oncology clinic at [phone].

## 2023-10-05 ENCOUNTER — Encounter: Payer: MEDICAID | Primary: Family Medicine

## 2023-10-09 ENCOUNTER — Inpatient Hospital Stay: Admit: 2023-10-09 | Payer: MEDICAID | Primary: Family Medicine

## 2023-10-09 VITALS — BP 144/104 | HR 117 | Temp 96.70000°F | Resp 18

## 2023-10-09 DIAGNOSIS — C7800 Secondary malignant neoplasm of unspecified lung: Secondary | ICD-10-CM

## 2023-10-09 MED ORDER — DAKINS (1/4 STRENGTH) 0.125 % EX SOLN
0.125 | Freq: Every day | CUTANEOUS | 3 refills | Status: AC
Start: 2023-10-09 — End: ?

## 2023-10-09 MED ORDER — METRONIDAZOLE-CLEANSER 0.75 % GEL EX KIT
0.75 | PACK | Freq: Every day | CUTANEOUS | 3 refills | 30.00000 days | Status: DC
Start: 2023-10-09 — End: 2024-02-29

## 2023-10-09 NOTE — Plan of Care (Signed)
 Wound Care Supplies      Supply Company:     [facility] [address] [place], [place] [address]  p: [phone] f: [phone]     Ordering Center:     [facility] CARE  [address]  [place] [address]  [phone]  WOUND CARE [phone]  FAX NUMBER [phone]    Patient Information:      Kyle Howell  [address]  [place] [address]   [phone]   DOB: 11-24-1980  AGE: 43 y.o.     GENDER: male   TODAYS DATE:  10/09/2023    Insurance:      PRIMARY INSURANCE:  Plan: [place]  Coverage[place]  Effective Date: 01/19/2023  [id number] - (Medicaid Managed)    SECONDARY INSURANCE:  Plan:   Coverage:   Effective Date:   @SECINSGROUPNUM @    @SECINSID @   @SECINSGROUPNUM @     Patient Wound Information:      Problem List Items Addressed This Visit    None      WOUNDS REQUIRING DRESSING SUPPLIES:     Wound 04/17/23 Leg #1 right medial leg (Active)   Wound Image   10/09/23 1121   Wound Etiology Other 10/09/23 1121   Wound Cleansed Cleansed with saline 10/09/23 1121   Dressing/Treatment Xeroform;Dry dressing 09/14/23 1508   Wound Length (cm) 2.3 cm 10/09/23 1121   Wound Width (cm) 5.6 cm 10/09/23 1121   Wound Depth (cm) 2.1 cm 10/09/23 1121   Wound Surface Area (cm^2) 12.88 cm^2 10/09/23 1121   Change in Wound Size % (l*w) -472.44 10/09/23 1121   Wound Volume (cm^3) 27.048 cm^3 10/09/23 1121   Wound Healing % -11921 10/09/23 1121   Wound Assessment Slough;Dusky;Pink/red 10/09/23 1121   Drainage Amount Moderate (25-50%) 10/09/23 1121   Drainage Description Serosanguinous 10/09/23 1121   Odor Malodorous/putrid 10/09/23 1121   Peri-wound Assessment Intact 10/09/23 1121   Margins Undefined edges 10/09/23 1121   Wound Thickness Description not for Pressure Injury Full thickness 10/09/23 1121   Number of days: 175       Wound 04/17/23 Posterior;Right #2 leg (Active)   Wound Image   10/09/23 1121   Wound Etiology Other 10/09/23 1121   Wound Cleansed Cleansed with  saline 10/09/23 1121   Dressing/Treatment Xeroform;Dry dressing 09/14/23 1508   Wound Length (cm) 10.5 cm 10/09/23 1121   Wound Width (cm) 8.5 cm 10/09/23 1121   Wound Depth (cm) 1.5 cm 10/09/23 1121   Wound Surface Area (cm^2) 89.25 cm^2 10/09/23 1121   Change in Wound Size % (l*w) -138 10/09/23 1121   Wound Volume (cm^3) 133.875 cm^3 10/09/23 1121   Wound Healing % -1558 10/09/23 1121   Wound Assessment Slough;Dusky;Pink/red;Hyper granulation tissue 10/09/23 1121   Drainage Amount Moderate (25-50%) 10/09/23 1121   Drainage Description Serosanguinous 10/09/23 1121   Odor Malodorous/putrid 10/09/23 1121   Peri-wound Assessment Intact 10/09/23 1121   Margins Undefined edges 10/09/23 1121   Wound Thickness Description not for Pressure Injury Full thickness 10/09/23 1121   Number of days: 175          Supplies Requested :      WOUND #: 1 and 2    PRIMARY DRESSING:  Other: Xeroform   SECONDARY DRESSING:  Other: super Quarry manager and Secure with: ABD pad  Bulky roll gauze     FREQUENCY OF DRESSING CHANGES:  Daily  ADDITIONAL ITEMS:  []  Gloves Small  []  Gloves Medium [x]  Gloves Large []  Gloves XLarge  []  Tape 1" [x]  Tape 2" []  Tape 3"  []  Medipore Tape  [x]  Saline  []  Skin Prep   []  Adhesive Remover   []  Cotton Tip Applicators   []  Other:    Patient Wound(s) Debrided: [x]  Yes   []  No    Debribement Type:  mechanical    Debridement Date: 10/09/2023    Patient currently being seen by Home Health: []  Yes   [x]  No    Duration for needed supplies:  [] 15  [x] 30  [] 60  [] 90 Days    Provider Information:      PROVIDER'S NAME:  Janice Norrie NP     NPI: [id number]
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
## 2023-10-09 NOTE — Discharge Instructions (Addendum)
 [facility] Wound Center and Hyperbaric Medicine   Physician Orders and Discharge Instructions  [facility]  [address]  [place], [place]  [address]  Telephone: [phone]      FAX [phone]        NAME:  Jamorion Gomillion                                                                                                 DATE OF BIRTH:  [birth year]  MEDICAL RECORD NUMBER:  [id number]     Your Case Manager is: Myrene Buddy or [facility] Care/Facility:  None     Wound Location: Right Posterior Leg, Right Medial Leg      Dressing orders:  1.Cleanse wound with Dakins solution. Sprinkle the wound bed with Metronidazole.   2. Cover with Xeroform to wounds then cover with optilock or super absorber   3. Wrap with bulky roll gauze  4. Change daily      Compression: none      Offloading Device:      Other Instructions: Continue with [facility] for treatments. Make an appointment for substance abuse -referral was made at your last appointment.  .Please pick up your Metronidazole and Dakin's solutions  prescription at your pharmacy and use as prescribed.      Keep all dressings clean, dry and intact.  Keep pressure off the wound(s) at all times.      Follow up visit   3 Weeks  October 30, 2023 @ 11:15am     Please give 24 hour notice if unable to keep appointment. [phone]     If you experience any of the following, please call the Wound Care Service at  [phone] or go to the nearest emergency room.        *Increase in pain*Temperature over 101*Increase in drainage from your wound or a foul odor  *Uncontrolled swelling*Need for compression bandage changes due to slippage, breakthrough drainage       PLEASE NOTE: IF YOU ARE UNABLE TO OBTAIN WOUND SUPPLIES, CONTINUE TO USE THE SUPPLIES YOU HAVE AVAILABLE UNTIL YOU ARE ABLE TO REACH [place]. IT IS MOST IMPORTANT TO KEEP THE WOUND COVERED AT ALL TIMES     Electronically signed by Janice Norrie, APRN - CNP on 10/09/2023 at 11:41 AM

## 2023-10-30 ENCOUNTER — Encounter: Payer: MEDICAID | Primary: Family Medicine

## 2023-11-06 ENCOUNTER — Encounter: Payer: Medicaid (Managed Care) | Primary: Family Medicine

## 2023-11-09 ENCOUNTER — Encounter: Payer: Medicaid (Managed Care) | Primary: Family Medicine

## 2023-11-11 ENCOUNTER — Encounter

## 2023-11-11 ENCOUNTER — Ambulatory Visit: Payer: Medicaid (Managed Care) | Primary: Family Medicine

## 2023-11-12 ENCOUNTER — Inpatient Hospital Stay: Admit: 2023-11-12 | Payer: Medicaid (Managed Care) | Primary: Family Medicine

## 2023-11-12 VITALS — BP 134/80 | HR 110 | Temp 99.80000°F | Resp 16

## 2023-11-12 DIAGNOSIS — D649 Anemia, unspecified: Secondary | ICD-10-CM

## 2023-11-12 LAB — TYPE AND SCREEN
ABO/Rh: O POS
Antibody Screen: NEGATIVE

## 2023-11-12 MED ORDER — SODIUM CHLORIDE 0.9 % IV SOLN
0.9 | INTRAVENOUS | Status: AC
Start: 2023-11-12 — End: 2023-11-12
  Administered 2023-11-12: 15:00:00 100

## 2023-11-12 MED FILL — SODIUM CHLORIDE 0.9 % IV SOLN: 0.9 % | INTRAVENOUS | Qty: 100 | Fill #0

## 2023-11-12 NOTE — Progress Notes (Addendum)
 Patient arrived to the unit via wheelchair. Vital signs taken and stable. Patient oriented to room and call light. Call light within reach.    1030 - IV placed without complications. Brisk blood return noted. Type and screen collected, labelled, and sent to labs. Pt tolerated well. Call light within reach.     1222 - Vital signs stable. 1 unit packed red blood cells verified with Velinda Getting, RN. Blood transfusion initiated per MAR orders. 15 minute observation stared. Nurse at bedside. Call light within reach.     1238 - Observation complete. Denies any adverse reactions at this time. Blood transfusion increased per protocol. Call light within reach.     1500 - Blood transfusion complete. Pt without signs/symptoms a this time. Vital signs stable. IV removed without complication. Pt tolerated well. Catheter appears intact, dressing applied. No drainage noted. Patient left the unit via wheelchair. All equipment used in the care for this patient has been cleaned.

## 2023-11-13 ENCOUNTER — Inpatient Hospital Stay: Admit: 2023-11-13 | Payer: Medicaid (Managed Care) | Primary: Family Medicine

## 2023-11-13 VITALS — BP 123/91 | HR 105 | Temp 97.50000°F | Resp 16

## 2023-11-13 DIAGNOSIS — D849 Immunodeficiency, unspecified: Secondary | ICD-10-CM

## 2023-11-13 DIAGNOSIS — C499 Malignant neoplasm of connective and soft tissue, unspecified: Secondary | ICD-10-CM
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
## 2023-11-13 NOTE — Discharge Instructions (Addendum)
 [facility] Wound Center and Hyperbaric Medicine   Physician Orders and Discharge Instructions  [facility]  [address]  [place], [place]  [address]  Telephone: [phone]      FAX [phone]        NAME:  Kyle Howell                                                                                                 DATE OF BIRTH:  09/18/1980  MEDICAL RECORD NUMBER:  [id number]     Your Case Manager is: Adel Holt or [facility] Care/Facility:  None     Wound Location: Right Posterior Leg, Right Medial Leg      Dressing orders:  1.Cleanse wound with Dakins solution. Sprinkle the wound bed with Metronidazole .   2. Cover with Xeroform to wounds then cover with optilock or super absorber   3. Wrap with bulky roll gauze  4. Change daily      Compression: none      Offloading Device:      Other Instructions: Continue with [facility] for treatments. Make an appointment for substance abuse -referral was made at your last appointment.  .Please pick up your Metronidazole  and Dakin's solutions  prescription at your pharmacy and use as prescribed.      Keep all dressings clean, dry and intact.  Keep pressure off the wound(s) at all times.      Follow up visit  3 Weeks May 16  , 2025 @ 1115am     Please give 24 hour notice if unable to keep appointment. [phone]     If you experience any of the following, please call the Wound Care Service at  [phone] or go to the nearest emergency room.        *Increase in pain*Temperature over 101*Increase in drainage from your wound or a foul odor  *Uncontrolled swelling*Need for compression bandage changes due to slippage, breakthrough drainage       PLEASE NOTE: IF YOU ARE UNABLE TO OBTAIN WOUND SUPPLIES, CONTINUE TO USE THE SUPPLIES YOU HAVE AVAILABLE UNTIL YOU ARE ABLE TO REACH US . IT IS MOST IMPORTANT TO KEEP THE WOUND COVERED AT ALL TIMES     Electronically signed by Ragena Bunker, APRN - CNP on 11/13/2023 at 10:31 AM

## 2023-11-13 NOTE — Plan of Care (Signed)
 Problem: Wound:  Goal: Will show signs of wound healing; wound closure and no evidence of infection  Description: Will show signs of wound healing; wound closure and no evidence of infection  Outcome: Progressing

## 2023-11-13 NOTE — Other (Signed)
 Wound Care Supplies      Supply Company:     [facility] [address] [place], [place] [address]  p: [phone] f: [phone]     Ordering Center:     [facility]  [address]  [place] [address]  [phone]  WOUND CARE Dept: [phone]   [facility] NUMBER [phone]    Patient Information:      Kyle Howell  [address]  [place] [address]   [phone]   DOB: 06-Mar-1981  AGE: 43 y.o.     GENDER: male   EPISODE DATE: 11/13/2023    Insurance:      PRIMARY INSURANCE:  Plan: [place]  [place]  Effective Date: 01/19/2023  Group Number: @[email or web address] @  Subscriber Number: [id number] - (Medicaid Managed)    Payer/Plan Subscr DOB Sex Relation Sub. Ins. ID Effective Group Num   1. HUMA[employer] MEDICAALVINO, REVILL 1980-08-28 Male Self [id number] 01/19/23 [id number]                                   [address] [address]       Patient Wound Information:      Problem List Items Addressed This Visit          Musculoskeletal and Integument    Carcinoma of synovia (HCC) - Primary       Other    Open wound of right lower leg    Anemia    Relevant Orders    Verify informed consent       ICD 10 Code:     S81.801A       WOUNDS REQUIRING DRESSING SUPPLIES:     Wound 04/17/23 Leg #1 right medial leg (Active)   Wound Image   11/13/23 1005   Wound Etiology Other 11/13/23 1005   Wound Cleansed Cleansed with saline 11/13/23 1005   Dressing/Treatment Xeroform 11/13/23 1043   Wound Length (cm) 2.3 cm 10/09/23 1121   Wound Width (cm) 5.6 cm 10/09/23 1121   Wound Depth (cm) 2.1 cm 10/09/23 1121   Wound Surface Area (cm^2) 12.88 cm^2 10/09/23 1121   Change in Wound Size % (l*w) -472.44 10/09/23 1121   Wound Volume (cm^3) 27.048 cm^3 10/09/23 1121   Wound Healing % -11921 10/09/23 1121   Wound Assessment Slough;Dusky;Pink/red 10/09/23 1121   Drainage Amount Moderate (25-50%) 10/09/23 1121   Drainage Description Serosanguinous 10/09/23 1121   Odor Malodorous/putrid 10/09/23 1121    Peri-wound Assessment Intact 10/09/23 1121   Margins Undefined edges 10/09/23 1121   Wound Thickness Description not for Pressure Injury Full thickness 10/09/23 1121   Number of days: 210       Wound 04/17/23 Posterior;Right #2 leg (Active)   Wound Image   11/13/23 1005   Wound Etiology Other 11/13/23 1005   Wound Cleansed Cleansed with saline 11/13/23 1005   Dressing/Treatment Xeroform 11/13/23 1043   Wound Length (cm) 11.5 cm 11/13/23 1005   Wound Width (cm) 15 cm 11/13/23 1005   Wound Depth (cm) 2 cm 11/13/23 1005   Wound Surface Area (cm^2) 172.5 cm^2 11/13/23 1005   Change in Wound Size % (l*w) -360 11/13/23 1005   Wound Volume (cm^3) 345 cm^3 11/13/23 1005   Wound Healing % -4172 11/13/23 1005   Wound Assessment Slough;Dusky;Hyper granulation tissue 11/13/23 1005   Drainage Amount Large (50-75% saturated) 11/13/23 1005   Drainage  Description Serosanguinous 11/13/23 1005   Odor Malodorous/putrid 11/13/23 1005   Peri-wound Assessment Intact 11/13/23 1005   Margins Undefined edges 11/13/23 1005   Wound Thickness Description not for Pressure Injury Full thickness 11/13/23 1005   Number of days: 210          Treatment plan:   For wound(s): 1,2  Apply primary dressing xeroform  Cover with superabsorber  Frequency of change daily    I  Supplies Requested :      WOUND #: 1 and 2   PRIMARY DRESSING:  Other: xeroform   secondary dressing: Other superabsorber, wrap with kerlix     FREQUENCY OF DRESSING CHANGES:  Daily         ADDITIONAL ITEMS:  []  Gloves Small  []  Gloves Medium [x]  Gloves Large []  Gloves XLarge  []  Tape 1" [x]  Tape 2" []  Tape 3"  []  Medipore Tape  [x]  Saline  []  Vashe  []  Skin Prep   []  Adhesive Remover   []  Cotton Tip Applicators   []  Other:    Patient Wound(s) Debrided: [x]  Yes if yes please add date 11/13/23     []  No    Debribement Type: Mechanical  All wounds debrided     Patient currently being seen by Home Health: []  Yes   [x]  No    Duration for needed supplies:  [] 15  [x] 30  [] 60  [] 90  Days    Electronically signed by Dorsey Gault, RN on 11/13/2023 at 10:52 AM     Provider Information:      PROVIDER'S NAME: Ragena Bunker, NP    NPI:     [id number]

## 2023-11-14 LAB — PREPARE RBC (CROSSMATCH): Dispense Status Blood Bank: TRANSFUSED

## 2023-12-04 ENCOUNTER — Inpatient Hospital Stay: Admit: 2023-12-04 | Payer: Medicaid (Managed Care) | Primary: Family Medicine

## 2023-12-04 VITALS — BP 129/84 | HR 127 | Temp 97.40000°F | Resp 19

## 2023-12-04 DIAGNOSIS — C499 Malignant neoplasm of connective and soft tissue, unspecified: Secondary | ICD-10-CM

## 2023-12-04 DIAGNOSIS — L97919 Non-pressure chronic ulcer of unspecified part of right lower leg with unspecified severity: Secondary | ICD-10-CM

## 2023-12-04 NOTE — Discharge Instructions (Addendum)
 [facility] Wound Center and Hyperbaric Medicine   Physician Orders and Discharge Instructions  [facility]  [address]  [place], [place]  [address]  Telephone: [phone]      FAX [phone]        NAME:  Kyle Howell                                                                                                 DATE OF BIRTH:  Jan 06, 1981  MEDICAL RECORD NUMBER:  [id number]     Your Case Manager is: Adel Holt or [facility] Care/Facility:  None     Wound Location: Right Posterior Leg, Right Medial Leg      Dressing orders:  1.Cleanse wound with Dakins solution. Sprinkle the wound bed with Metronidazole .   2. Cover with Xeroform to wounds then cover with optilock or super absorber   3. Wrap with bulky roll gauze  4. Change daily      Compression: none      Offloading Device:      Other Instructions: Continue with [facility] for treatments. Make an appointment for substance abuse clinic .Please pick up your Metronidazole  and Dakin's solutions  prescription at your pharmacy and use as prescribed.      Keep all dressings clean, dry and intact.  Keep pressure off the wound(s) at all times.      Follow up visit  3 Weeks December 25, 2023 @ 1115am     Please give 24 hour notice if unable to keep appointment. [phone]     If you experience any of the following, please call the Wound Care Service at  [phone] or go to the nearest emergency room.        *Increase in pain*Temperature over 101*Increase in drainage from your wound or a foul odor  *Uncontrolled swelling*Need for compression bandage changes due to slippage, breakthrough drainage       PLEASE NOTE: IF YOU ARE UNABLE TO OBTAIN WOUND SUPPLIES, CONTINUE TO USE THE SUPPLIES YOU HAVE AVAILABLE UNTIL YOU ARE ABLE TO REACH US . IT IS MOST IMPORTANT TO KEEP THE WOUND COVERED AT ALL TIMES     Electronically signed by Ragena Bunker, APRN - CNP on 12/04/2023 at 11:44 AM
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
## 2023-12-10 NOTE — Progress Notes (Signed)
 PCRM called patient to follow up with him regarding his votrient. Per EMR appears it was refilled and sent to the patient on 12/02/23. PCRM unable to contact patient, LVM encouraging a return call.      Patient Care Plan   Goals       . Medication adherence      Patient Care Plan: Medication Management    Interventions: PCRM provided education to patient/caregiver on importance of adhering to medication schedules and provided rationale    Expected Patient Goals: Patient will be able to manage his medications independently.           . No Unplanned Admission or ED Visits      Patient Care Plan: Imre Vecchione will have no unplanned hospital admissions and ED visits.     Interventions: PCRM provided education to patient/caregiver on importance of reporting symptoms sooner than later; proper use of PRN medications to manage symptoms once treatment commences; importance of compliance with all appointments.    Expected Patient Goals: Patient will have no unplanned ED visits or hospitalizations.      . Treatment Compliance      Patient Care Plan: Patient Compliance with Treatment Plan    Interventions: PCRM provided education to patient/caregiver on importance of keeping all appointments as scheduled; reporting symptoms as necessary; proper use of PRN medications to manage symptoms.    Expected Patient Goals: Patient will understand and be compliant with treatment plan. Patient will know when/who to call with questions and concerns.        . Wound care      Patient Care Plan: Wound Management    Interventions: PCRM made referral to local wound care for tumor wound    Expected Patient Goals: Patient will understand and be compliant with treatment plan. Patient will know when/who to call with questions and concerns.  Patient's wound will be free from complications and signs and symptoms of infection.  Patient will be able to manage wound care independently.   Patient will be aware of their DME/disposable supplier and how to  obtain supplies.            Services being utilized    [facility]  [address]  [place], [place]  [address]  Ph: [phone]  Fax: [phone]    [facility] Wound Care and Hyperbaric Medicine-following for wound care  [address]  [place], [place]  [address]  Tel: [phone]  Fax: [phone]    [facility] dressing supplies-covered $0 co-pay.   [address]  [place], [place] [address]  Phone: [phone]  Fax: [phone]    [facility]  [address]  [address]  [place], [place]  [address]  Ph: [phone]  Fax: [phone]  Labs hours M-F 6:30am-5:00pm-walk ins    [place]  [facility] Medical/[facility]/Hosp bed/ walker  Ph: [phone]  Fax: [phone]    Votrient-JOP specialty [place])  Ph: [phone]    [facility]-admitted 03/08/23-discharged 04/07/23  [address]   [place], [place] [address]   Phone: [phone]  Fax: [phone]    [facility]-approved 100%  Ph: [phone]    SA-approved at 100% through 11/10/23  HCAP approved at 100%  [facility]    Patient Care Instructions  LDAs  N/A-mediport removed 02/08/23    Plan for next [facility] outreach  Follow up approximately in the next few weeks for care plan monitoring and to assess  for acute and ongoing case management needs.     Deanna Expose BSN, RN, PCRM  [facility]  [facility]  Nurse Case Manager  Ph: [phone]    If assistance is needed during evening and weekend hours, please call medical oncology clinic at [phone].

## 2023-12-18 ENCOUNTER — Encounter

## 2023-12-18 LAB — LIPID, FASTING
Cholesterol, Fasting: 73 mg/dL (ref 0–199)
HDL: 31 mg/dL — ABNORMAL LOW (ref 40–59)
LDL Cholesterol: 24 mg/dL (ref 0–129)
Triglyceride, Fasting: 92 mg/dL (ref 0–150)

## 2023-12-18 LAB — T4, FREE: T4 Free: 0.82 ng/dL — ABNORMAL LOW (ref 0.84–1.68)

## 2023-12-18 LAB — TSH REFLEX TO FT4: TSH: 6.56 u[IU]/mL — ABNORMAL HIGH (ref 0.440–3.860)

## 2023-12-19 LAB — THYROGLOBULIN AND ANTI-THYROGLOBULIN AB
THYROGLOBULIN, SERUM OR PLASMA: 21.1 ng/mL (ref 1.3–31.8)
Thyroglobulin Ab: <1.5 [IU]/mL (ref 0.0–4.0)

## 2023-12-20 LAB — THYROID STIMULATING IMMUNOGLOBULIN: TSI: <0.1 IU/L (ref <=0.54)

## 2023-12-23 ENCOUNTER — Encounter

## 2023-12-23 MED ORDER — LEVOTHYROXINE SODIUM 25 MCG PO TABS
25 | ORAL_TABLET | Freq: Every day | ORAL | 0 refills | 90.00000 days | Status: DC
Start: 2023-12-23 — End: 2024-01-01

## 2023-12-25 ENCOUNTER — Inpatient Hospital Stay: Payer: Medicaid (Managed Care) | Primary: Family Medicine

## 2024-01-01 ENCOUNTER — Emergency Department: Admit: 2024-01-01 | Payer: Medicaid (Managed Care) | Primary: Family Medicine

## 2024-01-01 ENCOUNTER — Inpatient Hospital Stay
Admission: EM | Admit: 2024-01-01 | Discharge: 2024-01-03 | Disposition: A | Discharge status: Still In Hospital | Payer: Medicaid (Managed Care) | Source: Other Acute Inpatient Hospital | Admitting: Internal Medicine

## 2024-01-01 ENCOUNTER — Inpatient Hospital Stay: Admit: 2024-01-01 | Payer: Medicaid (Managed Care) | Primary: Family Medicine

## 2024-01-01 VITALS — BP 114/83 | HR 130 | Temp 97.20000°F | Resp 19

## 2024-01-01 DIAGNOSIS — A419 Sepsis, unspecified organism: Secondary | ICD-10-CM

## 2024-01-01 DIAGNOSIS — D649 Anemia, unspecified: Secondary | ICD-10-CM

## 2024-01-01 DIAGNOSIS — C499 Malignant neoplasm of connective and soft tissue, unspecified: Secondary | ICD-10-CM

## 2024-01-01 LAB — COMPREHENSIVE METABOLIC PANEL
ALT: 14 U/L (ref 0–41)
AST: 25 U/L (ref 0–40)
Albumin: 3.1 g/dL — ABNORMAL LOW (ref 3.5–4.6)
Alkaline Phosphatase: 134 U/L — ABNORMAL HIGH (ref 35–104)
Anion Gap: 12 meq/L (ref 9–15)
BUN: 6 mg/dL (ref 6–20)
CO2: 20 meq/L (ref 20–31)
Calcium: 9 mg/dL (ref 8.5–9.9)
Chloride: 101 meq/L (ref 95–107)
Creatinine: 0.55 mg/dL — ABNORMAL LOW (ref 0.70–1.20)
Est, Glom Filt Rate: >90 (ref >60)
Globulin: 3.7 g/dL — ABNORMAL HIGH (ref 2.3–3.5)
Glucose: 97 mg/dL (ref 70–99)
Potassium: 3.8 meq/L (ref 3.4–4.9)
Sodium: 133 meq/L — ABNORMAL LOW (ref 135–144)
Total Bilirubin: 0.8 mg/dL — ABNORMAL HIGH (ref 0.2–0.7)
Total Protein: 6.8 g/dL (ref 6.3–8.0)

## 2024-01-01 LAB — TYPE AND SCREEN
ABO/Rh: O POS
Antibody Screen: NEGATIVE

## 2024-01-01 LAB — CBC WITH AUTO DIFFERENTIAL
Basophils %: 1 %
Basophils Absolute: 0.1 10*3/uL (ref 0.0–0.2)
Eosinophils %: 2 %
Eosinophils Absolute: 0.1 10*3/uL (ref 0.0–0.7)
Hematocrit: 23.7 % — ABNORMAL LOW (ref 42.0–52.0)
Hemoglobin: 6.3 g/dL — CL (ref 14.0–18.0)
Lymphocytes %: 21 %
Lymphocytes Absolute: 1.1 10*3/uL (ref 1.0–4.8)
MCH: 28.3 pg (ref 27.0–31.3)
MCHC: 26.6 % — ABNORMAL LOW (ref 33.0–37.0)
MCV: 106.3 fL — ABNORMAL HIGH (ref 79.0–92.2)
Monocytes %: 7.7 %
Monocytes Absolute: 0.4 10*3/uL (ref 0.2–0.8)
Neutrophils %: 68 %
Neutrophils Absolute: 3.6 10*3/uL (ref 1.4–6.5)
Platelets: 258 10*3/uL (ref 130–400)
RBC: 2.23 M/uL — ABNORMAL LOW (ref 4.70–6.10)
RDW: 20 % — ABNORMAL HIGH (ref 11.5–14.5)
Smudge Cells: 9.6
WBC: 5.3 10*3/uL (ref 4.8–10.8)

## 2024-01-01 LAB — PROTIME-INR
INR: 1.2
Protime: 16.1 s — ABNORMAL HIGH (ref 12.3–14.9)

## 2024-01-01 LAB — ETHANOL: Ethanol Lvl: <10 mg/dL

## 2024-01-01 LAB — PREPARE RBC (CROSSMATCH): Dispense Status Blood Bank: TRANSFUSED

## 2024-01-01 LAB — LACTIC ACID
Lactic Acid: 3.5 mmol/L (ref 0.5–2.2)
Lactic Acid: 1.6 mmol/L (ref 0.5–2.2)

## 2024-01-01 LAB — LACTATE, SEPSIS: Lactic Acid, Sepsis: 2.3 mmol/L — ABNORMAL HIGH (ref 0.5–1.9)

## 2024-01-01 LAB — APTT: aPTT: 27.6 s (ref 24.4–36.8)

## 2024-01-01 MED ORDER — SODIUM CHLORIDE 0.9 % IV SOLN
0.9 | Freq: Once | INTRAVENOUS | Status: AC
Start: 2024-01-01 — End: 2024-01-01
  Administered 2024-01-01: 19:00:00 2250 mg/kg via INTRAVENOUS

## 2024-01-01 MED ORDER — ACETAMINOPHEN 325 MG PO TABS
325 | Freq: Four times a day (QID) | ORAL | Status: DC | PRN
Start: 2024-01-01 — End: 2024-01-03
  Administered 2024-01-02 (×3): 650 mg via ORAL

## 2024-01-01 MED ORDER — NORMAL SALINE FLUSH 0.9 % IV SOLN
0.9 | INTRAVENOUS | Status: DC | PRN
Start: 2024-01-01 — End: 2024-01-03

## 2024-01-01 MED ORDER — VANCOMYCIN INTERMITTENT DOSING (PLACEHOLDER)
INTRAVENOUS | Status: DC
Start: 2024-01-01 — End: 2024-01-03

## 2024-01-01 MED ORDER — IOPAMIDOL 76 % IV SOLN
76 | Freq: Once | INTRAVENOUS | Status: AC | PRN
Start: 2024-01-01 — End: 2024-01-01
  Administered 2024-01-01: 19:00:00 75 mL via INTRAVENOUS

## 2024-01-01 MED ORDER — SODIUM CHLORIDE 0.9 % IV SOLN (ADDEASE)
0.9 | Freq: Three times a day (TID) | INTRAVENOUS | Status: DC
Start: 2024-01-01 — End: 2024-01-03
  Administered 2024-01-02 – 2024-01-03 (×5): 3375 mg via INTRAVENOUS

## 2024-01-01 MED ORDER — NORMAL SALINE FLUSH 0.9 % IV SOLN
0.9 | Freq: Two times a day (BID) | INTRAVENOUS | Status: DC
Start: 2024-01-01 — End: 2024-01-03
  Administered 2024-01-02 – 2024-01-03 (×3): 10 mL via INTRAVENOUS

## 2024-01-01 MED ORDER — ACETAMINOPHEN 500 MG PO TABS
500 | Freq: Once | ORAL | Status: AC
Start: 2024-01-01 — End: 2024-01-01
  Administered 2024-01-01: 21:00:00 1000 mg via ORAL

## 2024-01-01 MED ORDER — IOPAMIDOL 76 % IV SOLN
76 | Freq: Once | INTRAVENOUS | Status: AC | PRN
Start: 2024-01-01 — End: 2024-01-01
  Administered 2024-01-01: 23:00:00 75 mL via INTRAVENOUS

## 2024-01-01 MED ORDER — SODIUM CHLORIDE 0.9 % IV SOLN (ADDEASE)
0.9 | Freq: Once | INTRAVENOUS | Status: AC
Start: 2024-01-01 — End: 2024-01-01
  Administered 2024-01-01: 18:00:00 3375 mg via INTRAVENOUS

## 2024-01-01 MED ORDER — LIDOCAINE HCL URETHRAL/MUCOSAL 2 % EX PRSY
2 | Freq: Once | CUTANEOUS | Status: AC
Start: 2024-01-01 — End: 2024-01-01
  Administered 2024-01-01: 15:00:00 via TOPICAL

## 2024-01-01 MED ORDER — PANTOPRAZOLE SODIUM 40 MG PO TBEC
40 | Freq: Every day | ORAL | Status: DC
Start: 2024-01-01 — End: 2024-01-03
  Administered 2024-01-02 – 2024-01-03 (×2): 40 mg via ORAL

## 2024-01-01 MED ORDER — SODIUM CHLORIDE 0.9 % IV SOLN
0.9 | INTRAVENOUS | Status: DC | PRN
Start: 2024-01-01 — End: 2024-01-02

## 2024-01-01 MED ORDER — SODIUM CHLORIDE 0.9 % IV SOLN
0.9 | Freq: Three times a day (TID) | INTRAVENOUS | Status: DC
Start: 2024-01-01 — End: 2024-01-03
  Administered 2024-01-02 – 2024-01-03 (×5): 1250 mg via INTRAVENOUS

## 2024-01-01 MED ORDER — SODIUM CHLORIDE 0.9 % IV SOLN
0.9 | INTRAVENOUS | Status: DC | PRN
Start: 2024-01-01 — End: 2024-01-03

## 2024-01-01 MED ORDER — SODIUM CHLORIDE 0.9 % IV BOLUS
0.9 | Freq: Once | INTRAVENOUS | Status: AC
Start: 2024-01-01 — End: 2024-01-01
  Administered 2024-01-01: 18:00:00 1000 mL via INTRAVENOUS

## 2024-01-01 MED ORDER — OXYCODONE HCL 5 MG PO TABS
5 | Freq: Once | ORAL | Status: AC
Start: 2024-01-01 — End: 2024-01-01
  Administered 2024-01-01: 21:00:00 5 mg via ORAL

## 2024-01-01 MED ORDER — SODIUM CHLORIDE 0.9 % IV BOLUS
0.9 | Freq: Once | INTRAVENOUS | Status: AC
Start: 2024-01-01 — End: 2024-01-01
  Administered 2024-01-01: 23:00:00 1000 mL via INTRAVENOUS

## 2024-01-01 MED ORDER — ACETAMINOPHEN 650 MG RE SUPP
650 | Freq: Four times a day (QID) | RECTAL | Status: DC | PRN
Start: 2024-01-01 — End: 2024-01-03

## 2024-01-01 MED FILL — SODIUM CHLORIDE 0.9 % IV SOLN: 0.9 % | INTRAVENOUS | Qty: 2000 | Fill #0

## 2024-01-01 MED FILL — PIPERACILLIN SOD-TAZOBACTAM SO 3.375 (3-0.375) G IV SOLR: 3.375 (3-0.375) g | INTRAVENOUS | Qty: 3375 | Fill #0

## 2024-01-01 MED FILL — SODIUM CHLORIDE 0.9 % IV SOLN: 0.9 % | INTRAVENOUS | Qty: 1000 | Fill #0

## 2024-01-01 MED FILL — ACETAMINOPHEN EXTRA STRENGTH 500 MG PO TABS: 500 MG | ORAL | Qty: 2 | Fill #0

## 2024-01-01 MED FILL — OXYCODONE HCL 5 MG PO TABS: 5 MG | ORAL | Qty: 1 | Fill #0

## 2024-01-01 MED FILL — VANCOMYCIN HCL 1.25 G IV SOLR: 1.25 g | INTRAVENOUS | Qty: 1250 | Fill #0

## 2024-01-01 MED FILL — MONOJECT FLUSH SYRINGE 0.9 % IV SOLN: 0.9 % | INTRAVENOUS | Qty: 40 | Fill #0

## 2024-01-01 NOTE — Plan of Care (Signed)
 Problem: Chronic Conditions and Co-morbidities  Goal: Patient's chronic conditions and co-morbidity symptoms are monitored and maintained or improved  Outcome: Progressing     Problem: Discharge Planning  Goal: Discharge to home or other facility with appropriate resources  Outcome: Progressing

## 2024-01-01 NOTE — Discharge Instructions (Addendum)
 [facility] Wound Center and Hyperbaric Medicine   Physician Orders and Discharge Instructions  [facility]  [address]  [place], [place]  [address]  Telephone: [phone]      FAX [phone]        NAME:  Kyle Howell                                                                                                 DATE OF BIRTH:  [birth year]  MEDICAL RECORD NUMBER:  [id number]     Your Case Manager is: Adel Holt or [facility] Care/Facility:  None     Wound Location: Right Posterior Leg, Right Medial Leg      Dressing orders:  1.Cleanse wound with Dakins solution. Sprinkle the wound bed with Metronidazole .   2. Cover with Xeroform to wounds then cover with optilock or super absorber   3. Wrap with bulky roll gauze  4. Change daily      Compression: none      Offloading Device:      Other Instructions: Continue with [facility] for treatments. Make an appointment for substance abuse clinic .      Keep all dressings clean, dry and intact.  Keep pressure off the wound(s) at all times.      Follow up visit  4 Weeks July 11 , 2025 @ 115am     Please give 24 hour notice if unable to keep appointment. [phone]     If you experience any of the following, please call the Wound Care Service at  [phone] or go to the nearest emergency room.        *Increase in pain*Temperature over 101*Increase in drainage from your wound or a foul odor  *Uncontrolled swelling*Need for compression bandage changes due to slippage, breakthrough drainage       PLEASE NOTE: IF YOU ARE UNABLE TO OBTAIN WOUND SUPPLIES, CONTINUE TO USE THE SUPPLIES YOU HAVE AVAILABLE UNTIL YOU ARE ABLE TO REACH US . IT IS MOST IMPORTANT TO KEEP THE WOUND COVERED AT ALL TIMES    Electronically signed by Ragena Bunker, APRN - CNP on 01/01/2024 at 11:46 AM

## 2024-01-01 NOTE — ED Notes (Signed)
Creat 0.5, pt ready for CT

## 2024-01-01 NOTE — ED Notes (Signed)
 1619 p/s hospt, dr Kerwin Peels r/c [phone]
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
## 2024-01-01 NOTE — Progress Notes (Signed)
 1913: Dr. Majumdar made aware of lactic acid of 2.3

## 2024-01-01 NOTE — ED Notes (Signed)
Lab called for 2nd set of cultures

## 2024-01-01 NOTE — ED Provider Notes (Signed)
 [facility]  Emergency Department Encounter  Emergency Medicine      Pt Name: Kyle Howell  BMW:[id number]  Birthdate 1980-08-03  Date of evaluation: 01/01/24  Time: 12:20 PM EDT  PCP:  Trisha Furlong, MD    CHIEF COMPLAINT       Chief Complaint   Patient presents with    Tachycardia     Tachycardia with diaphoretic at wound center       HISTORY OF PRESENT ILLNESS  (Location/Symptom, Timing/Onset, Context/Setting, Quality, Duration, ModifyingFactors, Severity.)      Kyle Howell is a 43 y.o. male with PMH of cancer presents from wound care with concern for tachycardia.  Patient was being evaluated for his right leg wound which she sees wound care here regularly and they noted that he was tachycardic and diaphoretic and sent him here for further evaluation.  Patient said he is often tachycardic and is being referred to cardiology however he was more tachycardic here up to the 140s to 150s.  he said he has been more lightheaded and short of breath when walking around recently.  Denies any bloody stools he said normal bowel movements normal urination no dysuria hematuria no abdominal pain no headache no chest pain shortness of breath cough fevers chills body aches hemoptysis.    PAST MEDICAL / SURGICAL / SOCIAL /FAMILY HISTORY      has a past medical history of Cancer (HCC), Embolic stroke (HCC), Gout, Hypertension, Pulmonary embolism (HCC), and Right leg DVT (HCC).  No other pertinent PMH on review with patient/guardian.     has a past surgical history that includes Arm Surgery.  No other pertinent PSH on review with patient/guardian.  Social History     Socioeconomic History    Marital status: Single     Spouse name: Not on file    Number of children: Not on file    Years of education: Not on file    Highest education level: Not on file   Occupational History    Not on file   Tobacco Use    Smoking status: Never    Smokeless tobacco: Never   Vaping Use    Vaping status: Never Used   Substance and Sexual  Activity    Alcohol use: Not Currently     Comment: used to drink a lot before cancer. now sparingly.    Drug use: Never    Sexual activity: Not on file   Other Topics Concern    Not on file   Social History Narrative    2 daughters in [place]  - 61 years old and 43 years of age     Not working currently - on disabliity      Social Drivers of Psychologist, prison and probation services Strain: Low Risk  (05/08/2023)    Overall Financial Resource Strain (CARDIA)     Difficulty of Paying Living Expenses: Not hard at all   Food Insecurity: No Food Insecurity (05/08/2023)    Hunger Vital Sign     Worried About Running Out of Food in the Last Year: Never true     Ran Out of Food in the Last Year: Never true   Transportation Needs: Unknown (05/08/2023)    PRAPARE - Therapist, art (Medical): Not on file     Lack of Transportation (Non-Medical): No   Physical Activity: Not on file   Stress: Not on file   Social Connections: Not  on file   Intimate Partner Violence: Not on file   Housing Stability: Unknown (05/08/2023)    Housing Stability Vital Sign     Unable to Pay for Housing in the Last Year: Not on file     Number of Times Moved in the Last Year: Not on file     Homeless in the Last Year: No       I counseled the patient against using tobacco products.    No family history on file.  No other pertinent FamHx on review with patient/guardian.    Allergies:  Patient has no known allergies.    Home Medications:  Prior to Admission medications    Medication Sig Start Date End Date Taking? Authorizing Provider   levothyroxine  (SYNTHROID ) 25 MCG tablet Take 1 tablet by mouth daily 12/23/23   Gerline Kohl, APRN - CNP   sodium hypochlorite (DAKINS) 0.125 % SOLN external solution Apply topically daily 10/09/23   Ragena Bunker, APRN - CNP   metroNIDAZOLE -Cleanser 0.75 % GEL KIT Apply 1 each topically daily 10/09/23   Ragena Bunker, APRN - CNP   metroNIDAZOLE  POWD powder Apply 1 Application topically daily 09/14/23    Ragena Bunker, APRN - CNP   sodium hypochlorite (DAKINS) 0.125 % SOLN external solution Apply topically daily 09/14/23   Ragena Bunker, APRN - CNP   sodium hypochlorite (DAKINS) 0.125 % SOLN external solution Apply topically daily 08/14/23   Ragena Bunker, APRN - CNP   acetaminophen  (TYLENOL ) 325 MG tablet Take 2 tablets by mouth every 4 hours as needed 02/28/23   [provider]   lidocaine-prilocaine (EMLA) 2.5-2.5 % cream Apply 1 Application topically 01/28/23   [provider]   loperamide (IMODIUM) 2 MG capsule Take 2 capsules by mouth 4 times daily as needed 04/09/23   [provider]   melatonin 3 MG TABS tablet Take 2 tablets by mouth nightly as needed 04/09/23   [provider]   Multiple Vitamin TABS Take 1 tablet by mouth daily 05/06/23   [provider]   prochlorperazine (COMPAZINE) 10 MG tablet Take 1 tablet by mouth every 6 hours as needed 04/09/23   [provider]   polyethylene glycol (GLYCOLAX ) 17 g packet Take 1 packet by mouth daily as needed 04/09/23   [provider]   PAZOPanib HCl (VOTRIENT) 200 MG chemo tablet Take 4 tablets by mouth daily 04/14/23   [provider]   magnesium  oxide (MAG-OX) 400 (240 Mg) MG tablet Take 1 tablet by mouth daily    [provider]   apixaban (ELIQUIS) 5 MG TABS tablet Take 1 tablet by mouth 2 times daily    [provider]   gabapentin (NEURONTIN) 400 MG capsule Take 300 mg by mouth 2 times daily.    [provider]   aspirin 81 MG chewable tablet Take 1 tablet by mouth daily    [provider]   oxyCODONE (ROXICODONE) 5 MG immediate release tablet Take 1 tablet by mouth every 6 hours as needed for Pain.    [provider]       REVIEW OF SYSTEMS    (2-9 systems for level 4, 10 ormore for level 5)      Review of Systems   All other systems reviewed and are negative.      PHYSICAL EXAM   (up to 7 for level 4, 8 or more for level 5)      INITIAL  VITALS:   BP 118/79  Pulse (!) 106   Temp 98.4 F (36.9 C) (Oral)   Resp 18   Ht 1.854 m (6' 1)   Wt 91 kg (200 lb 11.2 oz)   SpO2 97%   BMI 26.48 kg/m     Physical Exam  Constitutional:       General: He is not in acute distress.     Appearance: He is well-developed. He is not ill-appearing, toxic-appearing or diaphoretic.   HENT:      Head: Normocephalic and atraumatic.   Eyes:      General:         Right eye: No discharge.         Left eye: No discharge.      Conjunctiva/sclera: Conjunctivae normal.   Cardiovascular:      Rate and Rhythm: Regular rhythm. Tachycardia present.      Heart sounds: Normal heart sounds.      No friction rub. No gallop.   Pulmonary:      Effort: Pulmonary effort is normal.      Breath sounds: Normal breath sounds.   Abdominal:      General: Bowel sounds are normal. There is no distension.      Palpations: Abdomen is soft.      Tenderness: There is no abdominal tenderness. There is no guarding.   Musculoskeletal:         General: Swelling present.      Cervical back: Normal range of motion and neck supple.   Skin:     General: Skin is warm and dry.      Coloration: Skin is pale.      Comments: 8 cm raised mildly indurated circular lesion with blanchable erythema to the midline upper thoracic spine with mild fluctuance, no erythematous streaking    Very large chronic appearing wound/mass that is dark purple without surrounding erythema or discharge covering most of the mid right medial and posterior lower leg   Neurological:      General: No focal deficit present.      Mental Status: He is alert and oriented to person, place, and time.         DIFFERENTIAL  DIAGNOSIS     PLAN (LABS / IMAGING / EKG):  Orders Placed This Encounter   Procedures    Blood Culture 1    Culture, Blood 2    XR CHEST PORTABLE    CT THORACIC SPINE W CONTRAST    CT TIBIA FIBULA RIGHT W CONTRAST    Urinalysis with Reflex to Culture    Urine Drug Screen    Lactic Acid    ETOH    CBC with Auto Differential     CMP    Hemoglobin and Hematocrit    Protime-INR    APTT    Lactic Acid    Procalcitonin    Lactate, Sepsis    Vitamin B12 & Folate    ADULT DIET; Regular    Verify hospital blood product consent form has been signed and witnessed    Vital Signs For Blood Product Transfusion    Transfusion Reaction Management    Vital signs per unit routine    Daily weights    Intake and output    Initiate Hypoglycemia Treatment    Telemetry monitoring - Continuous    Up as tolerated    Full Code    Pharmacy to Dose: Vancomycin ; Skin and Soft Tissue Infection; 7 days    Initiate Oxygen Therapy Protocol  EKG 12 Lead    TYPE AND SCREEN    PREPARE RBC (CROSSMATCH), 1 Units    Ip Wound Care/Ostomy Nurse Eval and Treat    ADMIT TO INPATIENT       MEDICATIONS ORDERED:  Orders Placed This Encounter   Medications    sodium chloride  0.9 % bolus 1,000 mL    0.9 % sodium chloride  infusion    vancomycin  (VANCOCIN ) 2,250 mg in sodium chloride  0.9 % 500 mL IVPB     Antimicrobial Indications:   Skin and Soft Tissue Infection    piperacillin -tazobactam (ZOSYN ) 3,375 mg in sodium chloride  0.9 % 50 mL IVPB (addEASE)     Antimicrobial Indications:   Skin and Soft Tissue Infection    iopamidol  (ISOVUE -370) 76 % injection 75 mL    acetaminophen  (TYLENOL ) tablet 1,000 mg    oxyCODONE (ROXICODONE) immediate release tablet 5 mg     Refill:  0    sodium chloride  0.9 % bolus 2,000 mL    sodium chloride  flush 0.9 % injection 5-40 mL    sodium chloride  flush 0.9 % injection 5-40 mL    0.9 % sodium chloride  infusion    OR Linked Order Group     acetaminophen  (TYLENOL ) tablet 650 mg     acetaminophen  (TYLENOL ) suppository 650 mg    piperacillin -tazobactam (ZOSYN ) 3,375 mg in sodium chloride  0.9 % 50 mL IVPB (addEASE)     Antimicrobial Indications:   Skin and Soft Tissue Infection     Skin duration of therapy:   7 days    pantoprazole  (PROTONIX ) tablet 40 mg           DIAGNOSTIC RESULTS / EMERGENCY DEPARTMENT COURSE / MDM     LABS:  Results for orders placed  or performed during the hospital encounter of 01/01/24   Lactic Acid   Result Value Ref Range    Lactic Acid 3.5 (HH) 0.5 - 2.2 mmol/L   ETOH   Result Value Ref Range    Ethanol Lvl <10 mg/dL    Ethanol percent Not indicated G/dL   CBC with Auto Differential   Result Value Ref Range    WBC 5.3 4.8 - 10.8 K/uL    RBC 2.23 (L) 4.70 - 6.10 M/uL    Hemoglobin 6.3 (LL) 14.0 - 18.0 g/dL    Hematocrit 47.8 (L) 42.0 - 52.0 %    MCV 106.3 (H) 79.0 - 92.2 fL    MCH 28.3 27.0 - 31.3 pg    MCHC 26.6 (L) 33.0 - 37.0 %    RDW 20.0 (H) 11.5 - 14.5 %    Platelets 258 130 - 400 K/uL    Neutrophils % 68.0 %    Lymphocytes % 21.0 %    Monocytes % 7.7 %    Eosinophils % 2.0 %    Basophils % 1.0 %    Neutrophils Absolute 3.6 1.4 - 6.5 K/uL    Lymphocytes Absolute 1.1 1.0 - 4.8 K/uL    Monocytes Absolute 0.4 0.2 - 0.8 K/uL    Eosinophils Absolute 0.1 0.0 - 0.7 K/uL    Basophils Absolute 0.1 0.0 - 0.2 K/uL    Smudge Cells 9.6     Anisocytosis 2+     Macrocytes 2+     Polychromasia 1+     Poikilocytes 2+    CMP   Result Value Ref Range    Sodium 133 (L) 135 - 144 mEq/L    Potassium 3.8 3.4 - 4.9 mEq/L  Chloride 101 95 - 107 mEq/L    CO2 20 20 - 31 mEq/L    Anion Gap 12 9 - 15 mEq/L    Glucose 97 70 - 99 mg/dL    BUN 6 6 - 20 mg/dL    Creatinine 1.61 (L) 0.70 - 1.20 mg/dL    Est, Glom Filt Rate >90.0 >60    Calcium 9.0 8.5 - 9.9 mg/dL    Total Protein 6.8 6.3 - 8.0 g/dL    Albumin 3.1 (L) 3.5 - 4.6 g/dL    Total Bilirubin 0.8 (H) 0.2 - 0.7 mg/dL    Alkaline Phosphatase 134 (H) 35 - 104 U/L    ALT 14 0 - 41 U/L    AST 25 0 - 40 U/L    Globulin 3.7 (H) 2.3 - 3.5 g/dL   Protime-INR   Result Value Ref Range    Protime 16.1 (H) 12.3 - 14.9 sec    INR 1.2    APTT   Result Value Ref Range    aPTT 27.6 24.4 - 36.8 sec   Lactic Acid   Result Value Ref Range    Lactic Acid 1.6 0.5 - 2.2 mmol/L   EKG 12 Lead   Result Value Ref Range    Ventricular Rate 118 BPM    Atrial Rate 118 BPM    P-R Interval 174 ms    QRS Duration 66 ms    Q-T Interval 282 ms     QTc Calculation (Bazett) 395 ms    P Axis 68 degrees    R Axis 54 degrees    T Axis -36 degrees    Diagnosis       Sinus tachycardia  T wave abnormality, consider anterolateral ischemia  Abnormal ECG  When compared with ECG of 08-Feb-2023 07:48,  Questionable change in QRS duration     TYPE AND SCREEN   Result Value Ref Range    ABO/Rh O POS     Antibody Screen NEG    PREPARE RBC (CROSSMATCH), 1 Units   Result Value Ref Range    Product Code Blood Bank [id number]     Description Blood Bank Red Blood Cells, Leuko-reduced     Unit Number [id number]     Dispense Status Blood Bank transfused          IMPRESSION/MDM/ED COURSE:  43 y.o. male presented with acute tachycardia  Differential: Dehydration, electro abnormality, arrhythmia, anemia, pneumonia, less likely sepsis, not UTI, abscess, cellulitis  VS: Tachycardia otherwise normal    EKG my interpretation: none      CT thoracic spine with contrast does not show any obvious signs of spinal infection or fracture    Labs show significant anemia 6.3 will transfuse 1 unit platelets normal white count normal CMP shows mild hypoalbuminemia but no other significant findings    ED Course as of 01/01/24 1824   Fri Jan 01, 2024   1340 Lactic Acid(!!): 3.5  Believe lactic acid is secondary to anemia also causing his tachycardia and shortness of breath. [SF]   1400 Given history of cancer and what appears is an abscess to the midline upper back we will do CT scan with contrast as well as prophylactic antibiotics and complete sepsis workup [SF]   1538 EKG my interpretation: Sinus tachycardia rate 118, normal axis normal intervals diffuse T wave inversions no significant ST segment changes [SF]   1623 Consulted with Dr. Kerwin Peels of hospitalist service accepts patient for admission [SF]  ED Course User Index  [SF] Paschal Lorraine, DO         RADIOLOGY:  CT THORACIC SPINE W CONTRAST   Final Result   No acute thoracic spine abnormality.      Scattered pulmonary masses that are  similar to progressed compared to prior   examination.  Follow-up CT chest is recommended for further characterization.         XR CHEST PORTABLE   Final Result   No acute process.      Multiple bilateral pulmonary masses of various sizes consistent with   metastatic disease.  Prior images not available for comparison at the time of   dictation.         CT TIBIA FIBULA RIGHT W CONTRAST    (Results Pending)         EKG  See MDM for my interpretation     All EKG's are interpreted by the Emergency Department Physician who either signs or Co-signs this chart in the absence of a cardiologist.      PROCEDURES:  None    CONSULTS:  IP CONSULT TO PHARMACY    Critical Care Time:  Total critical care time: Approximately 35 minutes    Due to a high probability of clinically significant, life threatening deterioration, the patient required my highest level of preparedness to intervene emergently and I personally spent this critical care time directly and personally managing the patient.   This critical care time included obtaining a history; examining the patient; pulse oximetry; ordering and review of studies; arranging urgent treatment with development of a management plan; evaluation of patient's response to treatment; frequent reassessment; and, discussions with other providers.  This critical care time was performed to assess and manage the high probability of imminent, life-threatening deterioration that could result in multi-organ failure. It was exclusive of separately billable procedures and treating other patients and teaching time.    FINAL IMPRESSION      1. Anemia, unspecified type    2. Back abscess    3. Leg wound, right, initial encounter    4. Malignant neoplasm metastatic to both lungs [facility])          DISPOSITION / PLAN     DISPOSITION Admitted 01/01/2024 04:33:55 PM               PATIENT REFERREDTO:  No follow-up provider specified.    DISCHARGE MEDICATIONS:  Current Discharge Medication List          Paschal Clarksdale, DO  Emergency Medicine Physician  01/01/24 6:24 PM        (Please note that portions of this note were completed with a voice recognition program.Efforts were made to edit the dictations but occasionally words are mis-transcribed.)        Paschal Stratford, DO  01/01/24 1824
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
## 2024-01-01 NOTE — Progress Notes (Signed)
 [facility]   Pharmacy Pharmacokinetic Monitoring Service - Vancomycin      Kyle Howell is a 44 y.o. male starting on vancomycin  therapy for SSTI. Pharmacy consulted by Dr. Majumdar for monitoring and adjustment.    Target Concentration: Goal AUC/MIC 400-600 mg*hr/L    Additional Antimicrobials: Zosyn     Pertinent Laboratory Values:   Wt Readings from Last 1 Encounters:   01/01/24 91 kg (200 lb 11.2 oz)     Temp Readings from Last 1 Encounters:   01/01/24 98.4 F (36.9 C) (Oral)     Estimated Creatinine Clearance: 196 mL/min (A) (based on SCr of 0.55 mg/dL (L)).  Recent Labs     01/01/24  1246   CREATININE 0.55*   BUN 6   WBC 5.3     Pertinent Cultures:  Blood Culture 1 [[id number]] Collected: 01/01/24 1413   Order Status: Sent Specimen: Blood Updated: 01/01/24 1420   Culture, Blood 2 [[id number]] Collected: 01/01/24 1413   Order Status: Sent Specimen: Blood Updated: 01/01/24 1420   MRSA Nasal Swab: N/A. Non-respiratory infection.    Plan:  Dosing recommendations based on Bayesian software  Patient received vancomycin  2250 mg IV x 1 in ED 01/01/24 @ 1528  Start vancomycin  1250 mg IV Q8H  Anticipated AUC of 535 mg/L.hr and trough concentration of 14.1 mg/L at steady state  Renal labs as indicated   Vancomycin  concentration ordered for 06/15 @ 0600   Pharmacy will continue to monitor patient and adjust therapy as indicated    Thank you for the consult,  Belynda Brace, [facility]  01/01/2024 6:34 PM

## 2024-01-01 NOTE — Progress Notes (Signed)
 Notified NP Madelyne Schiff of lactate results greater than 2(2.5)

## 2024-01-01 NOTE — ED Notes (Signed)
 Lab at bedside

## 2024-01-01 NOTE — Plan of Care (Signed)
 Problem: Wound:  Goal: Will show signs of wound healing; wound closure and no evidence of infection  Description: Will show signs of wound healing; wound closure and no evidence of infection  Outcome: Progressing
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
## 2024-01-01 NOTE — Other (Signed)
 Informed Consent for Blood Component Transfusion Note    I have discussed with the patient the rationale for blood component transfusion; its benefits in treating or preventing fatigue, organ damage, or death; and its risk which includes mild transfusion reactions, rare risk of blood borne infection, or more serious but rare reactions. I have discussed the alternatives to transfusion, including the risk and consequences of not receiving transfusion. The patient had an opportunity to ask questions and had agreed to proceed with transfusion of blood components.    Electronically signed by Paschal [place], DO on 01/01/24 at 12:58 PM EDT

## 2024-01-02 ENCOUNTER — Inpatient Hospital Stay: Admit: 2024-01-02 | Payer: Medicaid (Managed Care) | Primary: Family Medicine

## 2024-01-02 LAB — CBC WITH AUTO DIFFERENTIAL
Basophils %: 0.2 %
Basophils Absolute: 0 10*3/uL (ref 0.0–0.2)
Eosinophils %: 2 %
Eosinophils Absolute: 0.1 10*3/uL (ref 0.0–0.7)
Hematocrit: 19.2 % — CL (ref 42.0–52.0)
Hemoglobin: 5.3 g/dL — CL (ref 14.0–18.0)
Lymphocytes %: 28 %
Lymphocytes Absolute: 1.1 10*3/uL (ref 1.0–4.8)
MCH: 29 pg (ref 27.0–31.3)
MCHC: 27.6 % — ABNORMAL LOW (ref 33.0–37.0)
MCV: 104.9 fL — ABNORMAL HIGH (ref 79.0–92.2)
Monocytes %: 5.8 %
Monocytes Absolute: 0.2 10*3/uL (ref 0.2–0.8)
Neutrophils %: 64 %
Neutrophils Absolute: 2.6 10*3/uL (ref 1.4–6.5)
PLATELET SLIDE REVIEW: ADEQUATE
Platelets: 202 10*3/uL (ref 130–400)
RBC: 1.83 M/uL — ABNORMAL LOW (ref 4.70–6.10)
RDW: 19.8 % — ABNORMAL HIGH (ref 11.5–14.5)
WBC: 4.1 10*3/uL — ABNORMAL LOW (ref 4.8–10.8)

## 2024-01-02 LAB — VITAMIN B12 & FOLATE
Folate: 4 ng/mL — ABNORMAL LOW (ref 4.8–24.2)
Vitamin B-12: 539 pg/mL (ref 232–1245)

## 2024-01-02 LAB — PROTIME-INR
INR: 1.1
Protime: 15.1 s — ABNORMAL HIGH (ref 12.3–14.9)

## 2024-01-02 LAB — ANTI-XA, HEPARIN: Anti-XA Unfrac Heparin: 0.16 [IU]/mL

## 2024-01-02 LAB — PREPARE RBC (CROSSMATCH)
Dispense Status Blood Bank: TRANSFUSED
Dispense Status Blood Bank: TRANSFUSED

## 2024-01-02 LAB — IRON AND TIBC
Iron % Saturation: 8 % — ABNORMAL LOW (ref 20–55)
Iron: 15 ug/dL — ABNORMAL LOW (ref 61–157)
TIBC: 199 ug/dL — ABNORMAL LOW (ref 250–450)
UIBC: 184 ug/dL (ref 112–347)

## 2024-01-02 LAB — APTT: aPTT: 31.6 s (ref 24.4–36.8)

## 2024-01-02 LAB — PROCALCITONIN: Procalcitonin: 0.19 ng/mL — ABNORMAL HIGH (ref 0.00–0.15)

## 2024-01-02 LAB — FERRITIN: Ferritin: 89 ng/mL (ref 30–400)

## 2024-01-02 LAB — LACTATE, SEPSIS
Lactic Acid, Sepsis: 2.5 mmol/L — ABNORMAL HIGH (ref 0.5–1.9)
Lactic Acid, Sepsis: 1.6 mmol/L (ref 0.5–1.9)

## 2024-01-02 MED ORDER — HEPARIN SODIUM (PORCINE) 1000 UNIT/ML IJ SOLN
1000 | INTRAMUSCULAR | Status: DC | PRN
Start: 2024-01-02 — End: 2024-01-02

## 2024-01-02 MED ORDER — HEPARIN SOD (PORCINE) IN D5W 100 UNIT/ML IV SOLN
100 | INTRAVENOUS | Status: DC
Start: 2024-01-02 — End: 2024-01-02

## 2024-01-02 MED ORDER — OXYCODONE-ACETAMINOPHEN 5-325 MG PO TABS
5-325 | Freq: Four times a day (QID) | ORAL | Status: DC | PRN
Start: 2024-01-02 — End: 2024-01-03
  Administered 2024-01-02 – 2024-01-03 (×3): 1 via ORAL

## 2024-01-02 MED ORDER — SODIUM CHLORIDE 0.9 % IV SOLN
0.9 | Freq: Every day | INTRAVENOUS | Status: AC
Start: 2024-01-02 — End: 2024-01-03
  Administered 2024-01-03 (×2): 125 mg via INTRAVENOUS

## 2024-01-02 MED ORDER — TRANEXAMIC ACID 1000 MG/10ML IV SOLN
1000 | Freq: Two times a day (BID) | INTRAVENOUS | Status: DC
Start: 2024-01-02 — End: 2024-01-03
  Administered 2024-01-03 (×2): 1000 mg via RESPIRATORY_TRACT

## 2024-01-02 MED ORDER — SODIUM CHLORIDE 0.9 % IV SOLN
0.9 | INTRAVENOUS | Status: DC | PRN
Start: 2024-01-02 — End: 2024-01-03

## 2024-01-02 MED ORDER — FOLIC ACID 1 MG PO TABS
1 | Freq: Every day | ORAL | Status: DC
Start: 2024-01-02 — End: 2024-01-03
  Administered 2024-01-02 – 2024-01-03 (×2): 1 mg via ORAL

## 2024-01-02 MED ORDER — IOPAMIDOL 76 % IV SOLN
76 | Freq: Once | INTRAVENOUS | Status: AC | PRN
Start: 2024-01-02 — End: 2024-01-02
  Administered 2024-01-02: 18:00:00 75 mL via INTRAVENOUS

## 2024-01-02 MED FILL — VANCOMYCIN HCL 1.25 G IV SOLR: 1.25 g | INTRAVENOUS | Qty: 1250 | Fill #0

## 2024-01-02 MED FILL — PIPERACILLIN SOD-TAZOBACTAM SO 3.375 (3-0.375) G IV SOLR: 3.375 (3-0.375) g | INTRAVENOUS | Qty: 3375 | Fill #0

## 2024-01-02 MED FILL — SODIUM CHLORIDE 0.9 % IV SOLN: 0.9 % | INTRAVENOUS | Qty: 1000 | Fill #0

## 2024-01-02 MED FILL — ACETAMINOPHEN 325 MG PO TABS: 325 MG | ORAL | Qty: 2 | Fill #0

## 2024-01-02 MED FILL — TRANEXAMIC ACID 1000 MG/10ML IV SOLN: 1000 MG/10ML | INTRAVENOUS | Qty: 10 | Fill #0

## 2024-01-02 MED FILL — FERRLECIT 12.5 MG/ML IV SOLN: 12.5 MG/ML | INTRAVENOUS | Qty: 125 | Fill #0

## 2024-01-02 MED FILL — FOLIC ACID 1 MG PO TABS: 1 MG | ORAL | Qty: 1 | Fill #0

## 2024-01-02 MED FILL — PANTOPRAZOLE SODIUM 40 MG PO TBEC: 40 MG | ORAL | Qty: 1 | Fill #0

## 2024-01-02 MED FILL — OXYCODONE-ACETAMINOPHEN 5-325 MG PO TABS: 5-325 MG | ORAL | Qty: 1 | Fill #0

## 2024-01-02 NOTE — Plan of Care (Signed)
 Problem: Chronic Conditions and Co-morbidities  Goal: Patient's chronic conditions and co-morbidity symptoms are monitored and maintained or improved  01/02/2024 1205 by Frosty Jews, RN  Outcome: Progressing  01/02/2024 0625 by Dois Freeze, RN  Outcome: Progressing     Problem: Discharge Planning  Goal: Discharge to home or other facility with appropriate resources  01/02/2024 1205 by Frosty Jews, RN  Outcome: Progressing  01/02/2024 0625 by Dois Freeze, RN  Outcome: Progressing  Flowsheets (Taken 01/01/2024 1946 by Wyman Heart, RN)  Discharge to home or other facility with appropriate resources:   Identify barriers to discharge with patient and caregiver   Identify discharge learning needs (meds, wound care, etc)   Refer to discharge planning if patient needs post-hospital services based on physician order or complex needs related to functional status, cognitive ability or social support system   Arrange for needed discharge resources and transportation as appropriate   Arrange for interpreters to assist at discharge as needed

## 2024-01-02 NOTE — Progress Notes (Addendum)
 Comprehensive Nutrition Assessment    Type and Reason for Visit:  Initial, Positive nutrition screen    Nutrition Recommendations/Plan:   Continue Current Diet, Start Oral Nutrition Supplements     Malnutrition Assessment:  Malnutrition Status:  At risk for malnutrition (01/02/24 1406)    Context:  Chronic Illness     Findings of the 6 clinical characteristics of malnutrition:  Energy Intake:  Mild decrease in energy intake  Weight Loss:  Unable to assess     Body Fat Loss:  No body fat loss     Muscle Mass Loss:  No muscle mass loss    Fluid Accumulation:  Unable to assess    Grip Strength:  Not Performed    Nutrition Assessment:    Pt presents at nutritional risk d/t reported decreased appetite curently and x~2-3 weeks pta, with altered taste affecting intake also. To provide 2 nutritional supplements-high calorie supplement tid with meals, frozen supplement bid and greek yogurt x1/day. To continue to follow.    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 Labs/meds reviewed. Hyponatremia noted. Pt receiving-ppi, antibiotic tx. Wound Type: Unstageable (Legs; also back abscess noted per progress notes)       Current Nutrition Intake & Therapies:    Average Meal Intake: 1-25%  Average Supplements Intake: None Ordered  ADULT DIET; Regular    Anthropometric Measures:  Height: 185.4 cm (6' 1)  Ideal Body Weight (IBW): 184 lbs (84 kg)    Admission Body Weight: 91 kg (200 lb 9.9 oz) (bedscale)  Current BMI (kg/m2):  26.5  Usual Body Weight: 97.5 kg (215 lb) ((04/2023); 240lb (stated per pt ~2 years ago))                          BMI Categories: Overweight (BMI 25.0-29.9)    Estimated Daily Nutrient Needs:  Energy Requirements Based On: Kcal/kg  Weight Used for Energy Requirements: Admission  Energy (kcal/day): 2550-2730 (kg x 28-30)  Weight Used for Protein Requirements: Ideal  Protein (g/day): 100-116 (kg x 1.2-1.4)  Method  Used for Fluid Requirements: 1 ml/kcal  Fluid (ml/day): ~2600    Nutrition Diagnosis:   Increased nutrient needs related to increase demand for energy/nutrients, catabolic illness as evidenced by wounds  Inadequate oral intake related to altered taste perception (reported decreased appetite) as evidenced by intake 0-25%, poor intake prior to admission, weight loss    Nutrition Interventions:   Food and/or Nutrient Delivery: Continue Current Diet, Start Oral Nutrition Supplement  Nutrition Education/Counseling: No recommendation at this time  Coordination of Nutrition Care: Continue to monitor while inpatient       Goals:  Goals: PO intake 75% or greater (Improved wound status)  Type of Goal: New goal       Nutrition Monitoring and Evaluation:      Food/Nutrient Intake Outcomes: Food and Nutrient Intake, Supplement Intake  Physical Signs/Symptoms Outcomes: Skin, Weight, Biochemical Data    Discharge Planning:     Continue ONS    Gertrude Kuba, RD, LD

## 2024-01-02 NOTE — Progress Notes (Signed)
 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  He was found to have hemoglobin of 6.3 on admission    1.  Tachycardia suspect multifactorial due to anemia and may be component of sepsis  - Broad IV antibiotics.  Follow culture data.  - Treat anemia: Currently receiving PRBC  - No signs of GI bleeding.    - He is having hemoptysis-started transexamic acid nebulization.  Noted concerning CT scan findings-worsening pulmonary metastases, acute and chronic pulmonary emboli, thrombus within inferior right pulmonary vein due to tumor invasion.  Pulmonology consulted  -Anticoagulation on hold due to active hemoptysis, anemia.  He had prior IVC filter  - Guarded to poor prognosis.  Transfer initiated to [facility] where he currently receives his cancer care.  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  He is going for CT scan of the chest and abdomen pelvis.    Current Facility-Administered Medications   Medication Dose Route Frequency Provider Last Rate Last  Admin    0.9 % sodium chloride  infusion   IntraVENous PRN Zebulen Simonis R, DO        folic acid (FOLVITE) tablet 1 mg  1 mg Oral Daily Hammond Obeirne R, DO        ferric gluconate (FERRLECIT) 125 mg in sodium chloride  0.9 % 100 mL IVPB  125 mg IntraVENous Daily Orren Pietsch R, DO        tranexamic acid (CYKLOKAPRON) injection 1,000 mg  1,000 mg Nebulization BID Kerri Kovacik R, DO        oxyCODONE-acetaminophen  (PERCOCET) 5-325 MG per tablet 1 tablet  1 tablet Oral Q6H PRN Maverik Foot R, DO        0.9 % sodium chloride  infusion   IntraVENous PRN Lyam Provencio R, DO        sodium chloride  flush 0.9 % injection 5-40 mL  5-40 mL IntraVENous 2 times per day Gerald Kitty R, DO   10 mL at 01/01/24 2230    sodium chloride  flush 0.9 % injection 5-40 mL  5-40 mL IntraVENous PRN Alveena Taira R, DO        0.9 % sodium chloride  infusion   IntraVENous PRN Verl Kitson R, DO        acetaminophen  (TYLENOL ) tablet 650 mg  650 mg Oral Q6H PRN Derron Pipkins R, DO   650 mg at 01/02/24 1627  Or    acetaminophen  (TYLENOL ) suppository 650 mg  650 mg Rectal Q6H PRN Shavone Nevers R, DO        piperacillin -tazobactam (ZOSYN ) 3,375 mg in sodium chloride  0.9 % 50 mL IVPB (addEASE)  3,375 mg IntraVENous Q8H Donye Campanelli R, DO 12.5 mL/hr at 01/02/24 1418 3,375 mg at 01/02/24 1418    pantoprazole  (PROTONIX ) tablet 40 mg  40 mg Oral QAM AC Malikai Gut R, DO   40 mg at 01/02/24 0551    vancomycin  (VANCOCIN ) intermittent dosing (placeholder)   Other RX Placeholder Gerald Kitty R, DO        vancomycin  (VANCOCIN ) 1,250 mg in sodium chloride  0.9 % 250 mL (addEASE) IVPB  1,250 mg IntraVENous Q8H Aili Casillas, Andree Kayser R, DO   1,250 mg at 01/02/24 5621       Physical Examination:      Vitals:  BP 119/72   Pulse (!) 104   Temp 98.1 F (36.7 C)   Resp 18   Ht 1.854 m (6' 1)   Wt 91 kg (200 lb 11.2 oz)   SpO2 97%   BMI 26.48 kg/m   Temp (24hrs), Avg:98.3 F (36.8 C), Min:98.1 F (36.7 C), Max:98.8 F (37.1 C)      General  appearance: alert, cooperative and no distress.  Pale  Mental Status: oriented to person, place and time and normal affect  Lungs: clear to auscultation bilaterally, normal effort  Heart: regular rate and rhythm, no murmur  Abdomen: soft, nontender, nondistended, bowel sounds present, no masses  Extremities: See photos.  Necrotic, fungating tumor in right lower extremity with foul odor    Data:     Labs:  Recent Labs     01/01/24  1246 01/02/24  0855   WBC 5.3 4.1*   HGB 6.3* 5.3*   PLT 258 202     Recent Labs     01/01/24  1246   NA 133*   K 3.8   CL 101   CO2 20   BUN 6   CREATININE 0.55*   GLUCOSE 97     Recent Labs     01/01/24  1246   AST 25   ALT 14   BILITOT 0.8*   ALKPHOS 134*
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
## 2024-01-02 NOTE — Care Coordination-Inpatient (Signed)
 Case Management Assessment  Initial Evaluation    Date/Time of Evaluation: 01/02/2024 4:51 PM  Assessment Completed by: Deryl Flora    If patient is discharged prior to next notation, then this note serves as note for discharge by case management.    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 Yes    Chart Reviewed: Yes      History Provided by: Patient  Patient Orientation: Alert and Oriented, Person, Place, Situation, Self    Patient Cognition: Alert    Hospitalization in the last 30 days (Readmission):  No    If yes, Readmission Assessment in CM Navigator will be completed.    Advance Directives:      Code Status: Full Code   Patient's Primary Decision Maker is: Named in Scanned ACP Document    Primary Decision Maker (Active): Elwyn Hamper Brother/Sister - [phone]    Secondary Decision Maker: Yves Herb - Step Child - [phone]    Secondary Decision Maker: Rodolfo Clan (ROB) - Brother/Sister [phone]    Discharge Planning:    Patient lives with: Other (Comment) (lives with sister) Type of Home: House  Primary Care Giver: Self  Patient Support Systems include: Family Members   Current Financial resources:    Current community resources:    Current services prior to admission: None            Current DME:              Type of Home Care services:  None    ADLS  Prior functional level: Independent in ADLs/IADLs  Current functional level: Independent in ADLs/IADLs    PT AM-PAC:   /24  OT AM-PAC:   /24    Family can provide assistance  at DC: Yes  Would you like Case Management to discuss the discharge plan with any other family members/significant others, and if so, who? Yes  Plans to Return to Present Housing: Yes  Other Identified Issues/Barriers to RETURNING to current housing: NO  Potential Assistance needed at discharge: N/A            Potential DME:    Patient expects to discharge to: House  Plan for transportation at discharge:      Financial    Payor: [employer] [place] / Plan: [employer] [place] / Product Type: *No Product type* /     Does insurance require precert for SNF: Yes    Potential assistance Purchasing Medications: No  Meds-to-Beds request:        [id number] [place], [place] - [address] - P [phone] - F [phone]  [address]  [place] [id number]  Phone: [phone] Fax: [phone]      Notes:    Factors facilitating achievement of predicted outcomes: Family support and Caregiver support    Barriers to discharge: Medical complications  Additional Case Management Notes: PT LIVES WITH SISTER.  INDEPENDENT.  PT IS CURRENT WITH OUR [facility].  PT STATES SISTER IS CAREGIVER.  HE'S TRIED HH IN THE PAST BUT COULDN'T GET AGENCY THRU HIS INS.  PT FOLLOWS [facility] [place] [facility] [place] .  PT HAS FWW, W/C, CANE.  PT DENIES HD, VA, O2.     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 Freedom of choice list with basic dialogue that supports the patient's individualized plan of care/goals and shares the quality data associated with the providers was provided to:     Patient Representative Name:       The Patient and/or Patient  Representative Agree with the Discharge Plan?      Deryl Flora  Case Management Department

## 2024-01-02 NOTE — Progress Notes (Addendum)
 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 He follows with Dr. Elliott Guy at [facility] in [place]. His last chemo was around September, 2024. He was treated with Adriamycin, Etoposide, Mesna and Ifosfamide. 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 He has a history of alcohol abuse drinking up to a pint a day but has not drank in the last 5 weeks.     He was tachycardic in the wound center and was advised to come to the ED. He reports he last had a blood transfusion about 6 months ago when he finished his chemo.      He was found to be anemic with hgb of 6.3 and was transfused. Hgb today 5.3 and he will be getting 2 units.     REVIEW OF SYSTEMS:    Unremarkable except for symptoms mentioned in HPI.    Current Inpatient Medications:    Current Facility-Administered Medications: 0.9 % sodium chloride  infusion, , IntraVENous, PRN  0.9 % sodium chloride  infusion, , IntraVENous, PRN  sodium chloride  flush 0.9 % injection 5-40 mL, 5-40 mL, IntraVENous, 2 times per day  sodium chloride  flush 0.9 % injection 5-40 mL, 5-40 mL, IntraVENous, PRN  0.9 % sodium chloride  infusion, , IntraVENous, PRN  acetaminophen  (TYLENOL ) tablet 650 mg, 650 mg, Oral, Q6H PRN **OR** acetaminophen  (TYLENOL ) suppository 650 mg, 650 mg, Rectal, Q6H PRN  piperacillin -tazobactam (ZOSYN ) 3,375 mg in sodium chloride  0.9 % 50 mL IVPB (addEASE), 3,375 mg, IntraVENous, Q8H  pantoprazole  (PROTONIX ) tablet 40 mg, 40 mg, Oral, QAM AC  vancomycin  (VANCOCIN ) intermittent dosing (placeholder), , Other, RX Placeholder  vancomycin  (VANCOCIN ) 1,250 mg in sodium chloride  0.9 % 250 mL (addEASE) IVPB, 1,250 mg, IntraVENous, Q8H    PHYSICAL EXAM:    VITALS:  BP 103/60   Pulse (!) 109   Temp 98.2 F (36.8 C) (Oral)   Resp 18   Ht 1.854 m (6' 1)   Wt 91 kg (200 lb 11.2 oz)   SpO2 93%   BMI  26.48 kg/m   INTAKE/OUTPUT:    Intake/Output Summary (Last 24 hours) at 01/02/2024 1233  Last data filed at 01/02/2024 8295  Gross per 24 hour   Intake 5585.76 ml   Output 1250 ml   Net 4335.76 ml     CONSTITUTIONAL:  awake, alert, cooperative, no apparent distress, and appears stated age.   ENT:  Normocephalic, external ears without lesions, oral pharynx with moist mucus membranes,  gums normal and good dentition.  NECK:  Supple, symmetrical, trachea midline, no adenopathy, skin normal  LUNGS:  No increased work of breathing, good air exchange, clear to auscultation bilaterally, no crackles or wheezing  CARDIOVASCULAR:  Normal apical impulse, regular rate and rhythm, normal S1 and S2, no murmur noted  ABDOMEN:  Normal bowel sounds, soft, non-distended, non-tender  MUSCULOSKELETAL:  Right lower leg is edematous and at least twice the size of the left leg. There is drainage on the dressing of the right leg.   NEUROLOGIC:  Awake, alert, oriented to name, place and time.   SKIN:  Right lower leg wound with dressing on. Mass on T spine open to air. Edema to right UE around an IV line with large bruise.     DATA:      PT/INR:  No results found for: [facility]  PTT:    Lab Results   Component Value Date/Time    APTT 31.6 01/02/2024 08:55 AM     CMP:    Lab Results   Component Value Date/Time    NA 133 01/01/2024 12:46 PM    K 3.8 01/01/2024 12:46 PM    CL 101 01/01/2024 12:46 PM    CO2 20 01/01/2024 12:46 PM    BUN 6 01/01/2024 12:46 PM     Magnesium :    Lab Results   Component Value Date/Time    MG 1.4 02/08/2023 05:30 AM     Phosphorus:  No components found for: PO4  Calcium:  No components found for: CA  CBC:    Lab Results   Component Value Date/Time    WBC 4.1 01/02/2024 08:55 AM    RBC 1.83 01/02/2024 08:55 AM    HGB 5.3 01/02/2024 08:55 AM    HCT 19.2 01/02/2024 08:55 AM    MCV 104.9 01/02/2024 08:55 AM    RDW 19.8 01/02/2024 08:55 AM    PLT 202 01/02/2024 08:55 AM     DIFF:    Lab Results   Component Value  Date/Time    MCV 104.9 01/02/2024 08:55 AM    RDW 19.8 01/02/2024 08:55 AM      LDH:  No results found for: LDH  Uric Acid:  No components found for: URIC    CBC with Differential:    Lab Results   Component Value Date/Time    WBC 4.1 01/02/2024 08:55 AM    RBC 1.83 01/02/2024 08:55 AM    HGB 5.3 01/02/2024 08:55 AM    HCT 19.2 01/02/2024 08:55 AM    PLT 202 01/02/2024 08:55 AM    MCV 104.9 01/02/2024 08:55 AM    MCH 29.0 01/02/2024 08:55 AM    MCHC 27.6 01/02/2024 08:55 AM    RDW 19.8 01/02/2024 08:55 AM    LYMPHOPCT 28.0 01/02/2024 08:55 AM    MONOPCT 5.8 01/02/2024 08:55 AM    EOSPCT 2.0 01/02/2024 08:55 AM    BASOPCT 0.2 01/02/2024 08:55 AM    MONOSABS 0.2 01/02/2024 08:55 AM    LYMPHSABS 1.1 01/02/2024 08:55 AM    EOSABS 0.1 01/02/2024 08:55 AM    BASOSABS 0.0 01/02/2024 08:55 AM     CMP:    Lab Results   Component Value Date/Time    NA 133 01/01/2024 12:46 PM    K 3.8 01/01/2024 12:46 PM    CL 101 01/01/2024 12:46 PM    CO2 20 01/01/2024 12:46 PM    BUN 6 01/01/2024 12:46 PM    CREATININE 0.55 01/01/2024 12:46 PM    LABGLOM >90.0 01/01/2024 12:46 PM    GLUCOSE 97 01/01/2024 12:46 PM    CALCIUM 9.0 01/01/2024 12:46 PM    BILITOT 0.8 01/01/2024 12:46 PM    ALKPHOS 134 01/01/2024 12:46 PM    AST 25 01/01/2024 12:46 PM    ALT 14 01/01/2024 12:46 PM     LDH:  No results found for: LDH  Warfarin PT/INR:  No components found for: PTPATWAR, PTINRWAR  PTT:    Lab Results   Component Value Date/Time    APTT 31.6 01/02/2024 08:55 AM   [APTT}  VITAMIN B12: No components found for: B12  FOLATE:    Lab Results   Component Value Date/Time    FOLATE 4.0 01/01/2024 06:20 PM     IRON:    Lab Results   Component Value Date/Time    IRON 15  01/02/2024 04:41 AM     Iron Saturation:  No components found for: PERCENTFE  TIBC:    Lab Results   Component Value Date/Time    TIBC 199 01/02/2024 04:41 AM     FERRITIN:    Lab Results   Component Value Date/Time    FERRITIN 89 01/02/2024 04:41 AM     Fibrinogen Level:  No  components found for: FIB  24 Hour Urine for Protein:  No components found for: Sheilda Deputy, UPRO24HR, UTV3  PSA: No results found for: PSA    RADIOLOGY RESULTS:  CT TIBIA FIBULA RIGHT W CONTRAST  Result Date: 01/01/2024  EXAMINATION: CT OF THE RIGHT TIBIA AND FIBULA WITH CONTRAST 01/01/2024 6:56 pm TECHNIQUE: CT of the right tibia and fibula was performed with the administration of intravenous contrast.  Multiplanar reformatted images are provided for review. Automated exposure control, iterative reconstruction, and/or weight based adjustment of the mA/kV was utilized to reduce the radiation dose to as low as reasonably achievable. COMPARISON: None. HISTORY ORDERING SYSTEM PROVIDED HISTORY: has sarcoma in RLE- concern area has become infected TECHNOLOGIST PROVIDED HISTORY: Reason for exam:->has sarcoma in RLE- concern area has become infected Additional Contrast?->1 What reading provider will be dictating this exam?->CRC FINDINGS: There is no previous studies available for comparison to determine the baseline. In the right leg, deep located in the posteromedial muscle compartment, there is a large expansile infiltrate malignant appearing mass lesion measuring 28 cm sup-inf x 15 cm transv x 10 AP cm approximately of nonhomogeneous density and nonhomogeneous enhancement.  The lesion has multi cystic and multi solid components, the solid components with indication for enhancement in addition to amorphous calcified components. In the lower posteromedial aspect of the right leg, the lesion breaks through the superficial muscular fascia/subcutaneous soft tissues/skin outline, forming a large fungating external component measuring 6.4 x 4.4 x 9 cm. There is evidence that the lesion is invading deep venous system of the right leg form an obstructive tumor thrombose combination expanding the affected veins in continuation is cephalad to the popliteal vein fully covered on this study. The epicenter of the lesion  is more towards the gastro kidney measures muscle particular the medial gastrocnemius muscle deep to the stretched/compressed muscle solids muscle. There significant degree of subcutaneous edema and swelling around the entire right leg particular in the mid distal 3rd down to the level of the ankle more diffusely. There are also extensive superficial subcutaneous saphenous venous collateral circulation due the obstruction of the deep venous system. Due the fungated component of the lesion extending outside of the bound areas of the skin the possibility for ongoing/active secondary infection cannot be excluded causing diffuse subcutaneous and inter compartmental and muscular cellulitis/myositis component cannot be excluded. Cannot determined if this is components of the lesion are infected or not. Also cannot determined if the occlusive tumour/thrombus in the deep venous system of the right lower extremity is infected or not. There are osteopenia visualized bones structures.  There periosteal reaction due direct involvement of the soft tumor into the bone along the posterior/medial cortical margin of the right tibia shaft. Due the presence of a permeated vessels through the affect cortical right tibia cannot exclude intramedullary extension of a malignancies.     1.  Large bulky complex aggressive sarcomatous malignancy of the right leg as above described with solid cyst and calcified components. 2.  There extension of the lesion to the cortical margin of the posteromedial aspect of the left tibial shaft as above  commented the. 3.  There are extension of the malignant process into the deep venous system of the left leg forming large occlusive tumor thrombus formation extending cephalad through the left popliteal vein. 4.  There is an additional exophytic fungating component of the lesion located the in the posteromedial 3rd of the left leg which breaks through the skin outline to the outside. 5.  Cannot determined if  there is a active/ongoing infectious process associated with the large size sarcomatous lesion.     CT THORACIC SPINE W CONTRAST  Result Date: 01/01/2024  EXAMINATION: CT OF THE THORACIC SPINE WITH CONTRAST  01/01/2024 2:51 pm: TECHNIQUE: CT of the thoracic spine was performed with the administration of intravenous contrast.  Multiplanar reformatted images are provided for review. Automated exposure control, iterative reconstruction, and/or weight based adjustment of the mA/kV was utilized to reduce the radiation dose to as low as reasonably achievable. COMPARISON: CT chest performed 02/08/2023. HISTORY: ORDERING SYSTEM PROVIDED HISTORY: upper thoracic skin abscess TECHNOLOGIST PROVIDED HISTORY: Reason for exam:->upper thoracic skin abscess Additional Contrast?->None What reading provider will be dictating this exam?->CRC FINDINGS: BONES/ALIGNMENT: There is normal alignment of the spine. The vertebral body heights are maintained. No osseous destructive lesion is seen. DEGENERATIVE CHANGES: No gross spinal canal stenosis or bony neural foraminal narrowing of the thoracic spine. SOFT TISSUES: No paraspinal mass is seen.  There is partial visualization scattered pulmonary masses.     No acute thoracic spine abnormality. Scattered pulmonary masses that are similar to progressed compared to prior examination.  Follow-up CT chest is recommended for further characterization.     XR CHEST PORTABLE  Result Date: 01/01/2024  EXAMINATION: ONE XRAY VIEW OF THE CHEST 01/01/2024 1:15 pm COMPARISON: 02/08/2023 HISTORY: ORDERING SYSTEM PROVIDED HISTORY: tachycardia TECHNOLOGIST PROVIDED HISTORY: Reason for exam:->tachycardia What reading provider will be dictating this exam?->CRC FINDINGS: The lungs are without acute focal process.  There is no effusion or pneumothorax. The cardiomediastinal silhouette is without acute process. The osseous structures are without acute process.  Multiple bilateral pulmonary masses of various sizes  consistent with metastatic disease.     No acute process. Multiple bilateral pulmonary masses of various sizes consistent with metastatic disease.  Prior images not available for comparison at the time of dictation.       Collaborating Physician: Dr. Ane Keener    ASSESSMENT AND PLAN:    Patient with anemia of chronic disease, history of chemotherapy which can contribute to anemia. He is iron deficient on labs, will add IV Fe while admitted. Transfuse to keep Hgb >7.0. Will check retic count and CT abdomen to rule out any abdominal bleed cause of anemia.   He will continue to follow with Dr. Elliott Guy at [facility] for his oncological needs.      Electronically signed by Lynett Sarah, APRN - CNP on 01/02/24 at 12:33 PM EDT

## 2024-01-02 NOTE — Plan of Care (Signed)
 Problem: Chronic Conditions and Co-morbidities  Goal: Patient's chronic conditions and co-morbidity symptoms are monitored and maintained or improved  01/02/2024 0625 by Dois Freeze, RN  Outcome: Progressing  01/01/2024 1853 by Myra Aschoff, RN  Outcome: Progressing     Problem: Discharge Planning  Goal: Discharge to home or other facility with appropriate resources  01/02/2024 0625 by Dois Freeze, RN  Outcome: Progressing  Flowsheets (Taken 01/01/2024 1946 by Wyman Heart, RN)  Discharge to home or other facility with appropriate resources:   Identify barriers to discharge with patient and caregiver   Identify discharge learning needs (meds, wound care, etc)   Refer to discharge planning if patient needs post-hospital services based on physician order or complex needs related to functional status, cognitive ability or social support system   Arrange for needed discharge resources and transportation as appropriate   Arrange for interpreters to assist at discharge as needed  01/01/2024 1853 by Myra Aschoff, RN  Outcome: Progressing

## 2024-01-03 LAB — HEMOGLOBIN AND HEMATOCRIT
Hematocrit: 25.9 % — ABNORMAL LOW (ref 42.0–52.0)
Hemoglobin: 7.4 g/dL — ABNORMAL LOW (ref 14.0–18.0)

## 2024-01-03 LAB — CBC WITH AUTO DIFFERENTIAL
Basophils %: 0.3 %
Basophils Absolute: 0 10*3/uL (ref 0.0–0.2)
Eosinophils %: 2 %
Eosinophils Absolute: 0.1 10*3/uL (ref 0.0–0.7)
Hematocrit: 24.3 % — ABNORMAL LOW (ref 42.0–52.0)
Hemoglobin: 7.2 g/dL — ABNORMAL LOW (ref 14.0–18.0)
Lymphocytes %: 16 %
Lymphocytes Absolute: 0.9 10*3/uL — ABNORMAL LOW (ref 1.0–4.8)
MCH: 28.9 pg (ref 27.0–31.3)
MCHC: 29.6 % — ABNORMAL LOW (ref 33.0–37.0)
MCV: 97.6 fL — ABNORMAL HIGH (ref 79.0–92.2)
Monocytes %: 6.8 %
Monocytes Absolute: 0.4 10*3/uL (ref 0.2–0.8)
Neutrophils %: 74.6 %
Neutrophils Absolute: 4.4 10*3/uL (ref 1.4–6.5)
PLATELET SLIDE REVIEW: ADEQUATE
Platelets: 178 10*3/uL (ref 130–400)
RBC: 2.49 M/uL — ABNORMAL LOW (ref 4.70–6.10)
RDW: 20.9 % — ABNORMAL HIGH (ref 11.5–14.5)
WBC: 5.9 10*3/uL (ref 4.8–10.8)

## 2024-01-03 LAB — RETICULOCYTES
Retic Ct Abs: 0.085 10*6/uL (ref 0.022–0.111)
Retic Ct Pct: 3.4 % — ABNORMAL HIGH (ref 0.6–2.2)

## 2024-01-03 LAB — CBC
Hematocrit: 23.1 % — ABNORMAL LOW (ref 42.0–52.0)
Hemoglobin: 6.9 g/dL — CL (ref 14.0–18.0)
MCH: 29.1 pg (ref 27.0–31.3)
MCHC: 29.9 % — ABNORMAL LOW (ref 33.0–37.0)
MCV: 97.5 fL — ABNORMAL HIGH (ref 79.0–92.2)
Platelets: 183 10*3/uL (ref 130–400)
RBC: 2.37 M/uL — ABNORMAL LOW (ref 4.70–6.10)
RDW: 20.9 % — ABNORMAL HIGH (ref 11.5–14.5)
WBC: 6.2 10*3/uL (ref 4.8–10.8)

## 2024-01-03 LAB — BASIC METABOLIC PANEL W/ REFLEX TO MG FOR LOW K
Anion Gap: 9 meq/L (ref 9–15)
BUN: 4 mg/dL — ABNORMAL LOW (ref 6–20)
CO2: 22 meq/L (ref 20–31)
Calcium: 8.3 mg/dL — ABNORMAL LOW (ref 8.5–9.9)
Chloride: 108 meq/L — ABNORMAL HIGH (ref 95–107)
Creatinine: 0.55 mg/dL — ABNORMAL LOW (ref 0.70–1.20)
Est, Glom Filt Rate: >90 (ref >60)
Glucose: 99 mg/dL (ref 70–99)
Potassium reflex Magnesium: 3.6 meq/L (ref 3.4–4.9)
Sodium: 139 meq/L (ref 135–144)

## 2024-01-03 LAB — VANCOMYCIN LEVEL, RANDOM: Vancomycin Rm: 27.6 ug/mL (ref 10.0–40.0)

## 2024-01-03 LAB — APTT: aPTT: 44.4 s — ABNORMAL HIGH (ref 24.4–36.8)

## 2024-01-03 LAB — ANTI-XA, HEPARIN: Anti-XA Unfrac Heparin: <0.1 [IU]/mL

## 2024-01-03 LAB — PROTIME-INR
INR: 1.2
Protime: 15.9 s — ABNORMAL HIGH (ref 12.3–14.9)

## 2024-01-03 MED ORDER — SODIUM CHLORIDE 0.9 % IV SOLN (ADDEASE)
0.9 | Freq: Three times a day (TID) | INTRAVENOUS | Status: DC
Start: 2024-01-03 — End: 2024-01-03

## 2024-01-03 MED ORDER — HEPARIN SOD (PORCINE) IN D5W 100 UNIT/ML IV SOLN
100 | INTRAVENOUS | Status: DC
Start: 2024-01-03 — End: 2024-01-03

## 2024-01-03 MED ORDER — HEPARIN SODIUM (PORCINE) 1000 UNIT/ML IJ SOLN
1000 | INTRAMUSCULAR | Status: DC | PRN
Start: 2024-01-03 — End: 2024-01-03

## 2024-01-03 MED FILL — FOLIC ACID 1 MG PO TABS: 1 MG | ORAL | Qty: 1 | Fill #0

## 2024-01-03 MED FILL — NA FERRIC GLUC CPLX IN SUCROSE 12.5 MG/ML IV SOLN: 12.5 MG/ML | INTRAVENOUS | Qty: 10 | Fill #0

## 2024-01-03 MED FILL — OXYCODONE-ACETAMINOPHEN 5-325 MG PO TABS: 5-325 MG | ORAL | Qty: 1 | Fill #0

## 2024-01-03 MED FILL — PIPERACILLIN SOD-TAZOBACTAM SO 3.375 (3-0.375) G IV SOLR: 3.375 (3-0.375) g | INTRAVENOUS | Qty: 3375 | Fill #0

## 2024-01-03 MED FILL — VANCOMYCIN HCL 1.25 G IV SOLR: 1.25 g | INTRAVENOUS | Qty: 1250 | Fill #0

## 2024-01-03 MED FILL — PANTOPRAZOLE SODIUM 40 MG PO TBEC: 40 MG | ORAL | Qty: 1 | Fill #0

## 2024-01-03 MED FILL — TRANEXAMIC ACID 1000 MG/10ML IV SOLN: 1000 MG/10ML | INTRAVENOUS | Qty: 10 | Fill #0

## 2024-01-03 NOTE — Progress Notes (Signed)
 Spoke with access center, pt was accepted by Dr.Luki, bed assignment is [address]39. Physician transport setup for 10am pt made aware.

## 2024-01-03 NOTE — Discharge Summary (Signed)
 [facility] Discharge Summary    Yogesh Cominsky  DOB:  1980-09-14  MRN:  [id number]    Admit date:  01/01/2024    Date of transfer:  01/03/2024    Admitting Physician:  Isac Maples, DO  Primary Care Physician:  Trisha Furlong, MD    Discharge Diagnoses:    Principal Problem:    Sepsis [facility])  Resolved Problems:    * No resolved hospital problems. *    Chief Complaint   Patient presents with    Tachycardia     Tachycardia with diaphoretic at wound center     Condition: improved   Activity: no strenuous activity   Diet: regular  Functional Status: ambulatory with assistance    Hospital Course:   42 year old male with complicated medical history including synovial cell sarcoma of right lower extremity with extensive pulmonary metastases, prior DVT/PE on Eliquis, history of prior multiple embolic CVA, alcohol use disorder in remission presents from wound care clinic with tachycardia. He was found to have hemoglobin of 6.3 on admission.  He was also treated for possible sepsis-his right lower extremity tumor is necrotic and open and he also had a superficial abscess on his mid back however that abscess did not appear to be the cause of acute infection (not hot, tender).  As of 6/15, blood cultures from 6/13 have no growth to date.  There was low-grade temperature 99.1 on the day of admission but otherwise no fevers or leukocytosis since.    No GI bleeding was noted.  On 6/14, he did develop hemoptysis.  He was accepted for transfer to [facility] on 6/14 and was transported in stable condition on 6/15.    After arrival, he should be trialed on heparin infusion for acute PE.  He will need close monitoring of hemoglobin and likely further blood transfusion.    Prognosis is guarded to poor.    Significant Findings:   Anemia  Acute and chronic PE  Progression of pulmonary metastasis    Exam on Discharge:   BP 111/61   Pulse (!) 105   Temp 98.2 F (36.8 C) (Oral)    Resp 18   Ht 1.854 m (6' 1)   Wt 91 kg (200 lb 11.2 oz)   SpO2 98%   BMI 26.48 kg/m   General appearance: alert, cooperative and no distress.  Pale  Mental Status: oriented to person, place and time and normal affect  Lungs: clear to auscultation bilaterally, normal effort  Heart: regular rate and rhythm, no murmur  Abdomen: soft, nontender, nondistended, bowel sounds present, no masses  Extremities: See photos.  Necrotic, fungating tumor in right lower extremity with foul odor    DC time 43 minutes    Signed:  Isac Maples, DO  01/03/2024, 12:36 PM
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
## 2024-01-03 NOTE — Progress Notes (Signed)
 [facility]   Pharmacy Pharmacokinetic Monitoring Service - Vancomycin        Consulting Provider: Dr. Kerwin Peels   Indication: SSTO  Target Concentration: Goal AUC/MIC 400-600 mg*hr/L  Day of Therapy: 3  Additional Antimicrobials: Zosyn        Pertinent Laboratory Values:   Wt Readings from Last 1 Encounters:   01/01/24 91 kg (200 lb 11.2 oz)     Temp Readings from Last 1 Encounters:   01/03/24 98.2 F (36.8 C) (Oral)     Estimated Creatinine Clearance: 196 mL/Zahriah Roes (A) (based on SCr of 0.55 mg/dL (L)).  Recent Labs     01/01/24  1246 01/02/24  0855 01/03/24  0556   CREATININE 0.55*  --  0.55*   BUN 6  --  4*   WBC 5.3 4.1* 5.9     Procalcitonin:   Recent Labs     01/01/24  1413   PROCAL 0.19*       Pertinent Cultures:  Recent Labs     01/01/24  1413 01/01/24  1406   BC  --  No Growth to date.  Any change in status will be called.   BLOODCULT2 No Growth to date.  Any change in status will be called.  --        Recent vancomycin  administrations                     vancomycin  (VANCOCIN ) 1,250 mg in sodium chloride  0.9 % 250 mL (addEASE) IVPB (mg) 1,250 mg Given 01/03/24 0849     1,250 mg Given  0135     1,250 mg Given 01/02/24 1835     1,250 mg Given  0842     1,250 mg Given 01/01/24 2228    vancomycin  (VANCOCIN ) 2,250 mg in sodium chloride  0.9 % 500 mL IVPB (mg) 2,250 mg New Bag 01/01/24 1528                    Assessment:  Date/Time Current Dose Concentration Timing of Concentration (h) AUC   01/04/24 1250mg  IV q8h 27.6 mg/mL 4h31m Predicted to be 655   Note: Serum concentrations collected for AUC dosing may appear elevated if collected in close proximity to the dose administered, this is not necessarily an indication of toxicity    Plan:  Current dosing regimen is supra-therapeutic  Decrease dose to 1000mg  IV q8h  Repeat vancomycin  concentration ordered for 06/16 @ 0600   Anticipated AUC of 526 mg/L.hr and trough concentration of 14.5 mg/L at steady state   Pharmacy will continue to monitor patient and  adjust therapy as indicated    Thank you for the consult,    Ronnie Colander, PharmD  PGY-1 Pharmacy Resident  [facility]  [phone]    01/03/2024 9:54 AM

## 2024-01-03 NOTE — Progress Notes (Signed)
 [facility] arrived to the building and were going to pick up another patient that had a later scheduled pick up time from the said patient. Said nurse approached the squad members and questioned another push back of said patient. Original pick-up time was for 1000, then 1203, then 1230. Squad members called their board and got the approval to take said patient. Patient is leaving for [facility].

## 2024-01-03 NOTE — Plan of Care (Signed)
 Problem: Chronic Conditions and Co-morbidities  Goal: Patient's chronic conditions and co-morbidity symptoms are monitored and maintained or improved  01/03/2024 1042 by Frosty Jews, RN  Outcome: Progressing  01/03/2024 0303 by Lucina Sabal, RN  Outcome: Progressing     Problem: Discharge Planning  Goal: Discharge to home or other facility with appropriate resources  01/03/2024 1042 by Frosty Jews, RN  Outcome: Progressing  01/03/2024 0303 by Lucina Sabal, RN  Outcome: Progressing
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
## 2024-01-04 LAB — EKG 12-LEAD
Atrial Rate: 118 {beats}/min
P Axis: 68 degrees
P-R Interval: 174 ms
Q-T Interval: 282 ms
QRS Duration: 66 ms
QTc Calculation (Bazett): 395 ms
R Axis: 54 degrees
T Axis: -36 degrees
Ventricular Rate: 118 {beats}/min

## 2024-01-06 LAB — CULTURE, BLOOD 2: Culture, Blood 2: NO GROWTH

## 2024-01-06 LAB — CULTURE, BLOOD 1: Blood Culture, Routine: NO GROWTH

## 2024-01-29 ENCOUNTER — Inpatient Hospital Stay: Admit: 2024-01-29 | Payer: Medicaid (Managed Care) | Primary: Family Medicine

## 2024-01-29 DIAGNOSIS — D649 Anemia, unspecified: Principal | ICD-10-CM

## 2024-01-29 DIAGNOSIS — L97812 Non-pressure chronic ulcer of other part of right lower leg with fat layer exposed: Principal | ICD-10-CM

## 2024-01-29 MED ORDER — LIDOCAINE HCL URETHRAL/MUCOSAL 2 % EX PRSY
2 | Freq: Once | CUTANEOUS | Status: AC
Start: 2024-01-29 — End: 2024-01-29
  Administered 2024-01-29: 16:00:00 via TOPICAL

## 2024-01-29 MED FILL — GLYDO 2 % EX PRSY: 2 % | CUTANEOUS | Qty: 6 | Fill #0

## 2024-01-29 NOTE — Plan of Care (Signed)
 Problem: Wound:  Goal: Will show signs of wound healing; wound closure and no evidence of infection  Description: Will show signs of wound healing; wound closure and no evidence of infection  Outcome: Progressing
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
## 2024-01-29 NOTE — Other (Signed)
 Wound Care Supplies      Supply Company:     [facility] [address] [place], [place] [address]  p: [phone] f: [phone]    Ordering Center:     [facility]  [address]  [place] [address]  [phone]  WOUND CARE [phone]  FAX NUMBER [phone]    Patient Information:      Kyle Howell  [address]  [place] [address]   [phone]   DOB: 10-04-1980  AGE: 43 y.o.     GENDER: male   TODAYS DATE:  01/29/2024    Insurance:      PRIMARY INSURANCE:  Plan[place]  [place]age[place]  Effective Date: 01/19/2023  [id number] - (Medicaid Managed)    SECONDARY INSURANCE:  Plan:   Coverage:   Effective Date:   @SECINSGROUPNUM @    @SECINSID @   @SECINSGROUPNUM @     Patient Wound Information:      Problem List Items Addressed This Visit          Musculoskeletal and Integument    Carcinoma of synovia (HCC)    Relevant Orders    Initiate Outpatient Wound Care Protocol       Other    Open wound of right lower leg    Relevant Orders    Initiate Outpatient Wound Care Protocol    Anemia - Primary    Relevant Orders    Verify informed consent       WOUNDS REQUIRING DRESSING SUPPLIES:     Wound 04/17/23 Posterior;Right #2 leg (Active)   Wound Image   01/29/24 1140   Wound Etiology Other 01/29/24 1134   Wound Cleansed Cleansed with saline 11/13/23 1005   Dressing/Treatment Xeroform 11/13/23 1043   Wound Length (cm) 16.5 cm 01/29/24 1134   Wound Width (cm) 18 cm 01/29/24 1134   Wound Depth (cm) 2 cm 11/13/23 1005   Wound Surface Area (cm^2) 297 cm^2 01/29/24 1134   Change in Wound Size % (l*w) -692 01/29/24 1134   Wound Volume (cm^3) 345 cm^3 11/13/23 1005   Wound Healing % -4172 11/13/23 1005   Wound Assessment Bleeding;Eschar moist 01/29/24 1134   Drainage Amount Moderate (25-50%) 01/29/24 1134   Drainage Description Sanguinous 01/29/24 1134   Odor Malodorous/putrid 01/29/24 1134   Peri-wound Assessment Intact 01/29/24 1134   Margins Defined edges  01/29/24 1134   Wound Thickness Description not for Pressure Injury Full thickness 01/29/24 1134   Number of days: 287          Supplies Requested :      WOUND #: 2   PRIMARY DRESSING:  Other: xeroform gauze  SECONDARY DRESSING:  Other: Large Optilock   Cover with 4x4 gauze kerlix and ace wrap     FREQUENCY OF DRESSING CHANGES:  Daily             ADDITIONAL ITEMS:  []  Gloves Small  []  Gloves Medium []  Gloves Large [x]  Gloves XLarge  []  Tape 1 [x]  Tape 2 []  Tape 3  []  Medipore Tape  [x]  Saline  []  Skin Prep   []  Adhesive Remover   []  Cotton Tip Applicators   []  Other:    Patient Wound(s) Debrided: [x]  Yes   []  No    Debribement Type: mechanical    Debridement Date: 01/29/24    Patient currently being seen by Home Health: []  Yes   [x]  No    Duration for needed supplies:  []   15  [x] 30  [] 60  [] 90 Days    Provider Information:      PROVIDER'S NAME:  ISAIAH SHAPE, NP   WEP:[id number]

## 2024-01-29 NOTE — Discharge Instructions (Addendum)
 [facility]ound [facility] and Hyperbaric Medicine   Physician Orders and Discharge Instructions  [facility]  [address]  [place], [place]  [address]  Telephone: [phone]      FAX [phone]        NAME:  Ladarrian Asencio                                                                                                 DATE OF BIRTH:  Dec 08, 1980  MEDICAL RECORD NUMBER:  [id number]     Your Case Manager is: Jamee or [facility] Care/Facility:  None     Wound Location: Right Posterior Leg, Right Medial Leg      Dressing orders:  1.Cleanse wound with Dakins solution. Sprinkle the wound bed with Metronidazole .   2. Cover with Xeroform to wounds then cover with optilock or super absorber   3. Wrap with bulky roll gauze  4. Change daily      Compression: none      Offloading Device:      Other Instructions: Continue with [facility] for treatments.      Keep all dressings clean, dry and intact.  Keep pressure off the wound(s) at all times.      Follow up visit  4 Weeks August 8 , 2025 @ 1115am     Please give 24 hour notice if unable to keep appointment. [phone]     If you experience any of the following, please call the Wound Care Service at  [phone] or go to the nearest emergency room.        *Increase in pain*Temperature over 101*Increase in drainage from your wound or a foul odor  *Uncontrolled swelling*Need for compression bandage changes due to slippage, breakthrough drainage       PLEASE NOTE: IF YOU ARE UNABLE TO OBTAIN WOUND SUPPLIES, CONTINUE TO USE THE SUPPLIES YOU HAVE AVAILABLE UNTIL YOU ARE ABLE TO REACH US . IT IS MOST IMPORTANT TO KEEP THE WOUND COVERED AT ALL TIMES    Electronically signed by Isaiah Shape, APRN - CNP on 01/29/2024 at 12:00 PM

## 2024-02-05 NOTE — Progress Notes (Signed)
 PCRM received call from patient stating that he hasn't been feeling well started about 2 days ago. Every time he would eat of drink he would throw it back up. Patient denies any abdominal pain/discomfort. Patient hasn't ate today yet but had been drinking and able to keep it down. PCRM encouraged patient to try and stay hydrated as much as possible. PCRM also provided patient with his PCP contact information and Dr. Rozelle clinic number to call the after hour triage nurses over the weekend if needed. Patient instructed to call this PCRM back by the end of the day if he starts to get sick again. Patient in agreement of plan.     Patient Care Plan  Care plans not addressed this encounter due to care plan monitoring only.      Services being utilized     [facility] Wound Care and Hyperbaric Medicine-following for wound care  [address]  [place], [place]  [address]  Tel: [phone]  Fax: [phone]     [facility] dressing supplies-covered $0 co-pay.   [address], [place] [address]  Phone: [phone]  Fax: [phone]     [facility]  [address]  [address]  [place], [place]  [address]  Ph: [phone]  Fax: [phone]  Labs hours M-F 6:30am-5:00pm-walk ins     [place]  [facility] Medical/[facility]/Hosp bed/ walker  Ph: [phone]  Fax: [phone]     [facility]  [address]  [place], [place]  [address]  Ph: [phone]  Fax: [phone]     [facility] specialty [place])  Ph: [phone]     [facility]-admitted 03/08/23-discharged 04/07/23  [address]   [place], [place] [address]   Phone: [phone]  Fax: [phone]     [facility]-approved 100%  Ph: [phone]     [place]-housing     SA-approved at 100% through 11/10/23  HCAP approved at 100%  [facility]    Clinical Care Summary  Wound Care local wound care team following and ordering supplies.         Plan for next [facility] outreach  Follow up approximately in the next few weeks for care plan monitoring ( GI/hep 7/22) and assess for acute and ongoing case management needs.       Edsel GLADE BSN, RN, PCRM  [facility]  [facility]  Nurse Case Manager  Ph: [phone]    If assistance is needed during evening and weekend hours, please call medical oncology clinic at [phone].

## 2024-02-09 NOTE — Progress Notes (Signed)
 ATTENDING STATEMENT:  Patient's history and physical exam reviewed with resident/fellow on 02/09/2024. All pertinent laboratory data, imaging and procedures have been reviewed. I have reviewed the resident/fellow documentation and have made appropriate changes. I agree with the resident/fellow dictated impression and plan as outlined below.     In summary, Kyle Howell is a 43 y.o.male with Met-ALD liver disease who presents to Hepatology clinic via [facility] for establishment of care. 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 The patient was initially seen in the hospital due to concerns for GIB which was felt to be secondary to hemoptysis. He was incidentally noted to have a possible nodular liver on imaging and hence has been referred for further evaluation. His risk factors for chronic liver disease include metabolic disease and history of alcohol use disorder (last drink ~ 3 months prior).  The patient's FIB-4 is low (0.79), however will plan for Firboscan and completion of chronic liver disease workups. Follow-up TBD pending fibroscan.     Redia Coyer, MD  Assistant Professor  Gastroenterology, Hepatology and Nutrition  [facility]

## 2024-02-09 NOTE — Progress Notes (Signed)
 I contacted patient on 02/05/2024 9:32 AM. Answers were put into visit during pre-charting.    Will the patient be in the state of [place]  at the time of the telehealth visit? Yes   **if no, notify your manager & clinical manager of potential issue    Chief complaint entered and any additional discussion items added to chief complaint comments. Yes     Confirm mode for the visit is MyChart Video visit: MyChart    MyChart - confirm patients knows to login 15 min in advance.  Yes     I entered/confirmed pharmacy and updated on chart. Yes     I reviewed allergies and marked reviewed on chart: Yes     I reviewed tobacco use and updated on chart: Yes     I reviewed medications and asked patient to have them available at time of visit. Additional medications not in med list added to list. Medications patient no longer taking removed from list. Yes     Any refills needed (if yes, please queue up)? No     Informed patient that if they do not receive link for their video visit within 15 minutes of scheduled appointment time, to contact that office directly to see if clinic is running late? Yes     I asked for any home vitals listed in the visit notes such as weight, blood pressure, etc and entered them into vitals. If patient is on Oxygen I documented their most recent oxygen level in the SpO2 field.Yes

## 2024-02-09 NOTE — Progress Notes (Signed)
 Hepatology Outpatient Consultation    -Referring Provider for today's consult: Barqadle, Maryan N, PA-C  -Primary Care Provider: Nat Breach    HPI:     Kyle Howell is a 43 y.o. 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     Patient presented to the hospital in June for hemoptysis in the setting of acute on chronic pulmonary emboli.  GI/hepatology was consulted for possible GI bleed, however there was low suspicion his hemoptysis was GI in origin.    Patient was incidentally noted to have hepatic steatosis on CT abdomen and slightly nodular contour of the liver concerning for cirrhosis.  Patient had no splenomegaly and most recent platelets were 326.  During his hospitalization patient mentioned drinking significant amount of vodka daily.    Interval history:  Patient today reports low appetite, but he feels better. He no longer drinks, last drink was April. In the past he was drinking from the ages of 69 to 59, he was drinking on average 3 pints ( vodka/whiskey) a week.     He did report family history of alcohol use disorder and alcohol related liver disease, however he denies family history of autoimmune conditions.    The patient also denies any abdominal pain, jaundice, leg swelling (he reports right leg is always swollen), abdominal distension, confusion, melena, hematochezia, or hematemesis.     Labs:  -ASMA 1:160, ANA negative, AMA negative, IgG normal   -Chronic hep panel negative   -Ceruloplasmin low 17  -Normal Alpha 1    FIB-4 Calculation: 0.79 at 01/13/2024  1:29 PM    Interval History:     A 10 system review was performed and other than as mentioned in the 'History of Present Illness' section above was negative for fevers, chills, headaches, decreased hearing, visual disturbances, nasal discharge, cough, chest pain, shortness of breath, urinary frequency, light-headedness, dizziness, mood disturbances, dry  eyes, dry mouth, new skin rashes, easy bruising, yellow eyes, heat nor cold intolerance.    Past Medical, Surgical, Social and Family History:      Past Medical History    Past Medical History:   Diagnosis Date   . Gout    . Hypertension    . Stroke 08/2022    lethargy, transient L sided weakness. MR very small R parietla infarct, several punctate DWI lesions bilateal frontal, l parietal, L occpital. CTA head, neck no significant stenosis. TTE filling defect LA. CT chest tumor thrombus R ower, L upper pulmonary artery with extension into LA   . Synovial sarcoma     PastSurgical History    Past Surgical History:   Procedure Laterality Date   . REMOVAL CENTRAL VENOUS ACCESS DEVICE TUNNELED W/ PORT PUMP Left 02/09/2023    Laterality: Left;  Surgeon: Josetta GORMAN Copes, MD;  Location: [facility] ([facility])   . INSERTION CVC TUNNELED W/ PORT PUMP Left 01/27/2023    Laterality: Left;  Surgeon: Josetta GORMAN Copes, MD;  Location: [facility] ([facility])   . REMOVAL CENTRAL VENOUS ACCESS DEVICE TUNNELED W/ PORT PUMP Right 12/18/2022    Laterality: Right;  Surgeon: Hildegard DELENA Brilliant, MD;  Location: [facility] ([facility])   . INSERTION CVC TUNNELED W/ PORT PUMP N/A 09/19/2022    Laterality: N/A;  Surgeon: Lynwood CHRISTELLA Shasta PONCE, MD;  Location: [facility] ([facility])   . INSERTION CVC NON-TUNNELED N/A 08/29/2022    Laterality:  N/A;  Surgeon: Lynwood CHRISTELLA Shasta PONCE, MD;  Location: [facility] ([facility])   . INSERTION VENA CAVA FILTER ENDOVASCULAR APPROACH W/ S&I N/A 08/26/2022    Laterality: N/A;  Surgeon: Rupert LITTIE Donalds, MD;  Location: [facility] ([facility])      Family History    History reviewed. No pertinent family history. Social History     reports that he has never smoked. He has never used smokeless tobacco. He reports that he does not currently use alcohol after a past usage of about 3.0 standard drinks of alcohol per week. He reports that he does  not currently use drugs.  He     Objective:   Vital Signs   Ht 1.88 m (6' 2)   Wt 93.9 kg (207 lb)   BMI 26.58 kg/m   Smoking Status Never     Physical Examination via video visit:  General: Awake, alert, cooperative.  Head:  NC/AT  ENT:  No scleral icterus  Cardiac:  Regular rate, + radial pulse   Lungs:   non-labor breathing   Musculoskeletal:  Adequate muscle mass.  Skin:  No apparent rashes on exposed area visible on video visit  Psych:  Appropriate Affect.    I reviewed and interpreted the following imaging studies independently, report as follows and essential to decision making today. Further justification in the plan below:     Labs:  -ASMA 1:160, ANA negative, AMA negative, IgG normal   -Chronic hep panel negative   -Ceruloplasmin low 17  -Normal Alpha 1    FIB-4 Calculation: 0.79 at 01/13/2024  1:29 PM    TTE:  .  Left Ventricle: Chamber size is normal. Normal wall thickness. Normal global systolic function. Regional wall motion is normal. Ejection fraction is normal (60 - 65%). Diastolic function is normal.  .  Right Ventricle: Chamber size is normal. Systolic function is normal.  .  Left Atrium: Chamber size is normal.  .  Aortic Valve: Trileaflet valve. Leaflet mobility is normal. No regurgitation. No stenosis.  .  Mitral Valve: Normal appearing leaflets. Leaflet mobility is normal. No regurgitation. No valve stenosis.  .  Tricuspid Valve: Normal leaflets. Leaflet mobility is normal. Trace regurgitation. No stenosis.  Lab results:      02/05/2024     9:43 AM 01/13/2024     2:27 PM 01/10/2024    11:36 AM 01/10/2024     8:44 AM 01/10/2024     4:04 AM 01/10/2024     4:03 AM 01/10/2024     3:18 AM   RADONC AMB Nursing Assessment   BP -- 118/73 116/63 119/72   114/64   BP Position  Sitting Lying Lying   Sitting   Pulse (Heart Rate) -- 118 105 114 115 118 118   Resp Rate  16 22 20   22    Temp -- 97.6 F (36.4 C) 98.1 F (36.7 C) 98.5 F (36.9 C)   98.9 F (37.2 C)   Temp source  Temporal Oral Oral   Oral    O2 Sat (%)  98 % 96 % 97 %   97 %   Weight 93.9 kg (207 lb) 95.3 kg (210 lb 3.2 oz)        Height 1.88 m (6' 2)         BSA (Calculated - sq m) 2.21 m2         BMI (Calculated) 26.6         Temp source  5 1 1   1    WEIGHT (IN LBS) 207 lb 210 lb 3.2 oz        HEIGHT (IN INCHES) 1.88 m (6' 2)         BSA (Calculated - sq m) 2.21 m2         Fatigue  Grade 1        Performance Status  Grade 2        Patient able to care for self at home?  yes           Failed to redirect to the Timeline version of the REVFS SmartLink.      01/10/2024     3:18 AM 01/10/2024     4:03 AM 01/10/2024     4:04 AM 01/10/2024     8:44 AM 01/10/2024    11:36 AM 01/13/2024     2:27 PM 02/05/2024     9:43 AM   Additional Nephrology Vitals   BP 114/64   119/72 116/63 118/73 --   BP Position Sitting   Lying Lying Sitting    Pulse (Heart Rate) 118 118 115 114 105 118 --   Resp Rate 22   20 22 16     Temp 98.9 F (37.2 C)   98.5 F (36.9 C) 98.1 F (36.7 C) 97.6 F (36.4 C) --   WEIGHT (IN LBS)      210 lb 3.2 oz 207 lb   Height       1.88 m (6' 2)     Assessment:   Kyle Howell is a 43 y.o. 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     Patient was incidentally noted to have hepatic steatosis on CT abdomen and slightly nodular contour of the liver concerning for cirrhosis.  Patient had no splenomegaly and most recent platelets were 326.  Patient for the last 15-20 years has been drinking 3 pints of liquor a week.  However, he reports he has not had an alcoholic beverage since April, and has quit cold [place].    During his hospitalization he underwent extensive liver work up which has been unrevealing. 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 We recommend obtaining FibroScan to assess degree of fibrosis.     Plan:   Plan:  Avoid all alcohol  Fibroscan to assess degree of fibrosis  Labs in 6 months with PCP: CBC, LFTs, INR, PETH     Patient was staffed  with Dr. Robyn, attending Hepatologist.    Ovid Mons, MD  Transplant Hepatology Fellow   Division of Gastroenterology, Hepatology & Nutrition  Department of Internal Medicine  [facility]  Pager [phone] or Epic Chat with questions

## 2024-02-10 NOTE — Progress Notes (Signed)
 PCRM reviewing patients EMR noted he seen GI/Hep yesterday 7/22, noting they would like a fibroscan and return after. Patient states that he hasn't had a drink since April. Otherwise patient stated he was doing well. Doesn't appear to be feeling sick any longer this week.     Patient Care Plan  Care plans not addressed this encounter due to care plan monitoring only.      Services being utilized     [facility]-following for wound care  [address]  [place], [place]  [address]  Tel: [phone]  Fax: [phone]     [facility] dressing supplies-covered $0 co-pay.   [address] [place] [address]  Phone: [phone]  Fax: [phone]     [facility]  [address]  [address]  [place], [place]  [address]  Ph: [phone]  Fax: [phone]  Labs hours M-F 6:30am-5:00pm-walk ins     [place]  [facility] Medical/[facility]Hosp bed/ walker  Ph: [phone]  Fax: [phone]     [facility]  [address]  [place], [place]  [address]  Ph: [phone]  Fax: [phone]     [facility] specialty [place])  Ph: [phone]     [facility]-admitted 03/08/23-discharged 04/07/23  [address]   [place], [place] [address]   Phone: [phone]  Fax: [phone]     [facility]-approved 100%  Ph: [phone]     [place]-housing     SA-approved at 100% through 11/10/23  HCAP approved at 100%  [facility]    Clinical Care Summary  Wound Care local wound care team following and ordering supplies.        Plan for next [facility] outreach  Follow up approximately in the next few weeks for care plan monitoring and assess for acute and ongoing case management needs.       Edsel GLADE BSN, RN, PCRM  [facility]  Ambulatory [facility] Medical Oncology  Nurse Case Manager  Ph: [phone]    If assistance is needed during evening and weekend hours, please call  medical oncology clinic at [phone].

## 2024-02-25 NOTE — Telephone Encounter (Signed)
 Called Kyle Howell to clarify that there is a refill available through [employer]. I encouraged him to call back if pharmacy did not have confirmation of this.   Patient verbalized understanding.

## 2024-02-25 NOTE — Progress Notes (Signed)
 [facility]:   Shipping/Pickup:   Patient has affirmed needing a refill of the following medications  for Shipment :    Med Name:  Votrient 200mg       Contact Info:    Specialty Pharmacy               [phone]

## 2024-02-25 NOTE — Telephone Encounter (Signed)
 Who is calling and what is the relationship: self-Please call patient confirm    Is the patient aware the call is being made: na    Physician: Selene    Medication that needs refilled: Enoxaparin Sodium 100 MG/ML Solution Prefilled Syringe injection (Order #  [id number])     Pharmacy: [facility] [id number] - [place] [address] - [address]     How much do you have remaining: 4 syringes    Is this call urgent: n

## 2024-02-26 ENCOUNTER — Encounter: Payer: Medicaid (Managed Care) | Primary: Family Medicine

## 2024-02-29 ENCOUNTER — Inpatient Hospital Stay: Admit: 2024-02-29 | Payer: Medicaid (Managed Care) | Primary: Family Medicine

## 2024-02-29 DIAGNOSIS — S81801A Unspecified open wound, right lower leg, initial encounter: Principal | ICD-10-CM

## 2024-02-29 MED ORDER — METRONIDAZOLE 0.75 % EX GEL
0.75 | CUTANEOUS | 2 refills | Status: AC
Start: 2024-02-29 — End: ?

## 2024-02-29 MED ORDER — DAKINS (1/4 STRENGTH) 0.125 % EX SOLN
0.125 | Freq: Every day | CUTANEOUS | 2 refills | Status: AC
Start: 2024-02-29 — End: ?

## 2024-02-29 NOTE — Discharge Instructions (Addendum)
 [facility] Center and Hyperbaric Medicine   Physician Orders and Discharge Instructions  [facility]  [address]  [place], [place]  [address]  Telephone: [phone]      FAX [phone]        NAME:  Kyle Howell                                                                                                 DATE OF BIRTH:  October 31, 1980  MEDICAL RECORD NUMBER:  [id number]     Your Case Manager is: Jamee or [facility] Care/Facility:  None     Wound Location: Right Posterior Leg, Right Medial Leg      Dressing orders:  1.Cleanse wound with Dakins solution. Sprinkle the wound bed with Metronidazole .   2. Cover with Xeroform to wounds then cover with optilock or super absorber   3. Wrap with bulky roll gauze  4. Change daily      Compression: none      Offloading Device:      Other Instructions: [facility] with [facility] for treatments/ next appt on 03/17/24     Keep all dressings clean, dry and intact.  Keep pressure off the wound(s) at all times.      Follow up visit  4 Weeks September 8 , 2025 @ 1PM     Please give 24 hour notice if unable to keep appointment. [phone]     If you experience any of the following, please call the Wound Care Service at  [phone] or go to the nearest emergency room.        *Increase in pain*Temperature over 101*Increase in drainage from your wound or a foul odor  *Uncontrolled swelling*Need for compression bandage changes due to slippage, breakthrough drainage       PLEASE NOTE: IF YOU ARE UNABLE TO OBTAIN WOUND SUPPLIES, CONTINUE TO USE THE SUPPLIES YOU HAVE AVAILABLE UNTIL YOU ARE ABLE TO REACH US . IT IS MOST IMPORTANT TO KEEP THE WOUND COVERED AT ALL TIMES    Electronically signed by Isaiah Shape, APRN - CNP on 02/29/2024 at 2:14 PM
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
## 2024-02-29 NOTE — Other (Signed)
 Wound Care Supplies      Supply Company:     [facility] [address] [place], [place] [address]  p: [phone] f: [phone]    Ordering Center:     [facility]  [address]  [place] [address]  [phone]  WOUND CARE [phone]  FAX NUMBER [phone]    Patient Information:      Kyle Howell  [address]  [place] [address]   [phone]   DOB: 1981-02-03  AGE: 43 y.o.     GENDER: male   TODAYS DATE:  02/29/2024    Insurance:      PRIMARY INSURANCE:  [place]  [place]  Effective Date: 01/19/2023  [id number] - (Medicaid Managed)    SECONDARY INSURANCE:  Plan:   Coverage:   Effective Date:   @SECINSGROUPNUM @    @SECINSID @   @SECINSGROUPNUM @     Patient Wound Information:      Problem List Items Addressed This Visit          Musculoskeletal and Integument    Carcinoma of synovia (HCC) - Primary    Relevant Medications    sodium hypochlorite (DAKINS) 0.125 % SOLN external solution    metroNIDAZOLE  (METROGEL ) 0.75 % gel    Other Relevant Orders    Initiate Outpatient Wound Care Protocol       Other    Open wound of right lower leg    Relevant Medications    sodium hypochlorite (DAKINS) 0.125 % SOLN external solution    metroNIDAZOLE  (METROGEL ) 0.75 % gel    Other Relevant Orders    Initiate Outpatient Wound Care Protocol       WOUNDS REQUIRING DRESSING SUPPLIES:     Wound 04/17/23 Posterior;Right #2 leg (Active)   Wound Image   02/29/24 1333   Wound Etiology Other 02/29/24 1324   Wound Cleansed Cleansed with saline 02/29/24 1324   Dressing/Treatment Xeroform;Dry dressing 02/29/24 1407   Wound Length (cm) 11.5 cm 02/29/24 1324   Wound Width (cm) 14.5 cm 02/29/24 1324   Wound Depth (cm) 1.9 cm 02/29/24 1324   Wound Surface Area (cm^2) 130.97 cm^2 02/29/24 1324   Change in Wound Size % (l*w) -249.25 02/29/24 1324   Wound Volume (cm^3) 165.889 cm^3 02/29/24 1324   Wound Healing % -1954 02/29/24 1324   Wound Assessment Eschar  moist;Hyper granulation tissue;Dusky 02/29/24 1324   Drainage Amount Large (50-75% saturated) 02/29/24 1324   Drainage Description Serosanguinous 02/29/24 1324   Odor Malodorous/putrid 02/29/24 1324   Peri-wound Assessment Intact 02/29/24 1324   Margins Defined edges 02/29/24 1324   Wound Thickness Description not for Pressure Injury Full thickness 02/29/24 1324   Number of days: 318          Supplies Requested :      WOUND #: 2   PRIMARY DRESSING:  Other: Xeroform  SECONDARY DRESSING:  Other: Superabsorbent: Large Optilock   Cover and Secure with: Other ABD pad Bulk Gauze     FREQUENCY OF DRESSING CHANGES:  Daily             ADDITIONAL ITEMS:  []  Gloves Small  [x]  Gloves Medium []  Gloves Large []  Gloves XLarge  []  Tape 1 [x]  Tape 2 []  Tape 3  []  Medipore Tape  [x]  Saline  []  Skin Prep   []  Adhesive Remover   []  Cotton Tip Applicators   []  Other:    Patient Wound(s) Debrided: [x]   Yes   []  No    Debribement Type: mechanical    Debridement Date: 02/29/24    Patient currently being seen by Home Health: []  Yes   [x]  No    Duration for needed supplies:  [] 15  [x] 30  [] 60  [] 90 Days    Provider Information:      PROVIDER'S NAME:  ISAIAH SHAPE, NP   WEP:[id number]

## 2024-02-29 NOTE — Telephone Encounter (Signed)
 Who is calling and what is the relationship: self- Patient requesting a confirmation call back-[phone]     Is the patient aware the call is being made: na    Physician: Selene  Medication that needs refilled: Enoxaparin    Pharmacy: [id number] - [place], [place] [phone] - [address]     How much do you have remaining: one syrnge    Is this call urgent: n

## 2024-03-28 ENCOUNTER — Encounter: Payer: Medicaid (Managed Care) | Primary: Family Medicine

## 2024-04-01 LAB — CBC WITH AUTO DIFFERENTIAL
Basophils %: 0.5 %
Basophils Absolute: 0 K/uL (ref 0.0–0.2)
Eosinophils %: 2.1 %
Eosinophils Absolute: 0.1 K/uL (ref 0.0–0.7)
Hematocrit: 29.7 % — ABNORMAL LOW (ref 42.0–52.0)
Hemoglobin: 8.9 g/dL — ABNORMAL LOW (ref 14.0–18.0)
Lymphocytes %: 18.6 %
Lymphocytes Absolute: 1.1 K/uL (ref 1.0–4.8)
MCH: 30.1 pg (ref 27.0–31.3)
MCHC: 30 % — ABNORMAL LOW (ref 33.0–37.0)
MCV: 100.3 fL — ABNORMAL HIGH (ref 79.0–92.2)
Monocytes %: 13.2 %
Monocytes Absolute: 0.8 K/uL (ref 0.2–0.8)
Neutrophils %: 65.1 %
Neutrophils Absolute: 3.7 K/uL (ref 1.4–6.5)
Platelets: 372 K/uL (ref 130–400)
RBC: 2.96 M/uL — ABNORMAL LOW (ref 4.70–6.10)
RDW: 21 % — ABNORMAL HIGH (ref 11.5–14.5)
WBC: 5.8 K/uL (ref 4.8–10.8)

## 2024-04-27 NOTE — Progress Notes (Signed)
 "FOLLOW UP VISIT -  [facility]    History of Present Illness   Kyle Howell is a 43 y.o. male who presents as a new patient in clinic for continuation of already started chemotherapy for recently diagnosed metastatic synovial cell sarcoma.      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 Initially presented for increasing SOB, found to have RLE DVT and bilateral PE (with LA bland thrombus vs tumor) and had acute decompensation with subsequent transfer to the ICU for acute neurologic change and AMS requiring intubation/sedation (2/9 - 2/13). 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 Started on HD-AIM C1D1 on 2/11 and remained intubated due to concerns that fluid shifts would lead to repeat intubation. S/p extubation, pt had some AMS and could not r/o ifosfamide encephalopathy, so pt was treated with methylene blue and thiamine. Mental status improved to baseline and pt was transferred to the floor. He received the 2nd cycle of AIM chemotherapy 3/4 - 3/9. C2 with 20% dose reduction per Dr. Selene given thrombocytopenia and to reduce risk of neurotoxicity, and added thiamine 100mg  IV q8h.     Treatment History  High Dose AIM  >08/31/22  C1D1 Doxorubicin 25mg /m2 d1-3 + Ifosfamide 2000mg /m2 d1-5 q21 d (completed in MICU)  >09/22/22    C2D1 Doxorubicin 20mg /m2 d1-3 + Ifosfamide 2000mg /m2 d1-4 q21 d (20% DR)  >10/15/22  C3D1 Doxorubicin 20mg /m2 d1-3 + Ifosfamide 2000mg /m2 d1-4 q21 d (20% DR  >11/05/22  C4D1 Doxorubicin 20mg /m2 d1-3 + Ifosfamide 2000mg /m2 d1-4 q21 d (20% DR)  >11/26/22  C5D1 Doxorubicin 20mg /m2 d1-3 + Ifosfamide 2000mg /m2 d1-4 q21 d (20% DR)  >12/17/22  C6D1 Doxorubicin 20mg /m2 d1-3 + Ifosfamide 2000mg /m2 d1-4 q21 d (20% DR)  Ifosfamide/Etoposide  > 01/08/2023 C1D1 ifosfamide 1.8 g/m2/d CIV d1-4 + etoposide 100 mg/m2/d d1-4 q21d (20% DR,  infusional)  > 01/28/2023 C2D1 ifosfamide 1.8 g/m2/d CIV d1-4 + etoposide 100 mg/m2/d d1-4 q21d (20% DR, infusional) - discontinued due to repeated hospitalizations/PS - required ICU stay.  Pazopanib  >~04/18/23 Started pazopanib 400mg  PO daily  >05/06/23 Increased to 600mg  PO daily    Interval History    Kyle Howell presents today for follow-up visit while on pazopanib 600mg  daily and review of restaging scans.  He was admitted from 8/29-9/04/2024 for acute anemia and bleeding leg wound:    Brief Summary of Hospital Course for Discharge Summary:   SUBJECTIVE  Rns were not able to get a PIV placed in time - CVC was not removed till today AM. No significant overnight events. Stated that he had a good night compared to the last few in the week. Denied any new concerns. No new bleed from his RLE wound. Stated that he was having some intermittent cough and had a few spots of blood but symptoms improved with cough syrup prn.   No further fevers. Spoke to IR and reviewed plan with his existing IVC filter.   Remainder of ROS queried and negative.  Discharge plan of care was discussed with patient in detail.   Reviewed the pertinent labs and imaging studies and reviewed the results with patient.       OBJECTIVE  Temp:  [98 F (36.7 C)-98.5 F (36.9 C)] 98.2 F (36.8 C)  Pulse (Heart Rate):  [100-118] 118  Resp Rate:  [16-20] 20  BP: (105-132)/(60-75) 132/75  O2 Sat (%):  [94 %-  97 %] 97 %  Wt Readings from Last 1 Encounters:  03/29/2587.4 kg (194 lb 14.4 oz)        24hrTMAX Afebrile.      Intake/Output Summary (Last 24 hours) at 03/30/2024 1016  Last data filed at 03/30/2024 0900  Gross per 24 hour  Intake834.33 ml  Output2100 ml  Net-1265.67 ml        General:  A&O to self, time, place, and situation, no apparent distress   Eyes:  PERRL, EOMI, anicteric sclera, conjunctive & lids normal     Neck:  Supple, no rigidity  Oropharynx:  no erythema, exudates, or lesions.  Respiratory:  Clear to auscultation bilaterally, no  crackles/rhonchi/wheezes.  Cardiovascular:  Regular rate and rhythm, no murmurs, rubs, clicks, or gallops.   Abdomen:  Bowel sounds in all quadrants. soft, non-tender.  No palpable masses.    Neurological:  CN II-XII grossly intact.  No focal deficits  Extremities:  RLE swelling from known malignancy over the Rt calf. Dressing currently dry and clean with no acute signs of bleeding.   Skin:  Skin color, texture, turgor normal.  No rashes or lesions.     Psychosocial:  Affect appropriate     03/23/2024:   CVC 03/18/24 Triple-lumen internal jugular vein, right (Active)  Number of days: 5     CONSULTS  IP CONSULT TO RADIATION ONCOLOGY  IP CONSULT TO GASTROENTEROLOGY  IP CONSULT TO SURGERY - ORTHOPAEDICS  IP CONSULT TO WOUND / OSTOMY NURSING TEAM  IP CONSULT TO PERIPHERAL IV TEAM  IP CONSULT TO PERIPHERAL IV TEAM     ASSESSMENT AND PLAN     Kyle Howell is a 43 y.o. 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      ACUTE PROBLEMS      Acute on chronic anemia, likely secondary to chronic disease and low grade blood loss secondary to RLE tumor   Iron deficiency   - hgb 5.2 in clinic 8/28 on admit.   - states he was supposed to be getting labs outpatient but has been slacking. Reported ongoing daily slight blood loss from his tumor prior to admit.   - Had been on therapeutic Lovenox for his DVT/PE prior to admit. Held for now with his severe anemia.   - CVC placed in ED for issues with IV access.   - s/p multiple units of PRBC since presentation to ED. Hb stabilized for now.   -S/P iv iron infusion 500 mg of iv dextran   - Appreciate OP Onc Dr. Selene inputs. Recs to hold PO Pazopanib for now with initial plan for possible endoscopic evals.   - CT Rt Tib-Fib showed known malignancy with no acute signs of bleed or abscess.   - OrthoOnc consulted; appreciate recs. Could offer AKA for pt but he stated that he was not currently interested. [facility] met w pt to discuss prosthesis options for him post amputation. PCRM/SW to review rehab options available for pt as well. Pt would need OP follow up with Dr. Marsa in 3-4 weeks in the clinic post RT   - RadOnc consulted to assess if pt candidate for palliative RT to assist with acute bleed. Offering 1 fraction of possible 8Gy - can give only limited dose as too much RT might cause the wound to worsen. Might help his small vasculature enough that it will slow or stop the oozing in a  few weeks. CT sim completed today - RT scheduled for 9/8  - GI consulted to assess ongoing iron deficiency - initially approved for EGD/Colonoscopy as IP but pt refused to undergo procedures on day they were scheduled. Team deferring procedures to OP as low acute clinical concern for GIB.   - Discussed recs to hold off on restarting [facility] today as pt has been having worsening bleed from his wound with need for recurrent blood transfusions daily.   - Hb dropped <7gm/dl again on 0/0/7974. Hb stable post transfusion today.   - Will plan for CBC recheck as OP next week. JTOCC follow up arranged as well.      Fevers - 9/7  Elevated Lactate - improved   - Started with low grade temp earlier on 9/7 AM  - Developed fever of 101.51F overnight on 9/7. Mild tachycardia. No tachypnea, confusion. Lactate elevated but BP stable. No other end organ damage for sepsis.   - CXR and UA negative for infection. Blood cx peripheral x2 with no growth for now.   - Started on IVF - lactate improved on recheck   - Started on Cefepime empirically initially - will deescalate to PO Doxycycline and Cefdinir to complete further 5days course.   - CVC discontinued today prior to discharge as PIV unable to get placed initially.      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 Selene [facility]   - s/p HD AIM, ifos/etop  - current tx: pazopanib - hold since admit per Onc recs.  - CTAP without evidence of metastatic disease, CTPE  negative for PE, stable pulmonary metastasis. CT tib/fib as above   - Dr. Selene aware of admission - appreciate recs. Recs to hold Pazopanib for now till 3 days post RT - can resume at prior dose.   - RadOnc offered palliative RT with 1 fraction on 9/8 to RLE wound as above. Scheduled 1 fraction for 9/8  - Appreciate OrthoOnc recs as well. Will arrange OP follow up with Dr. Marsa to consider AKA if pt agrees in the future.      PE/DVT  - dx 08/22/23  - eliquis started - transitioned to lovenox for recurrent PE in June 2025  - has been on lovenox - hold due to bleeding since admit  - Patient has IVC filter (08/26/2022)  - Tolerated Heparin  subcutaneous prophylactic dose. Initiated Heparin  drip at lower PTT initially and then advanced to standard PTT but pt has not tolerated systemic anticoagulation with bleeding worsening and needing freq blood transfusions.   - discussed risks vs benefits multiple times over last few days. Pt 9/9 agreed with recs to hold off on further systemic AC.   - IR able to review older images - IVC filter appears to be in appropriate position. Will prevent massive PE from occurring but smaller PE could still develop as he had a recurrent PE in 12/2023. IVC filter may need to be removed once pt is able to tolerate systemic AC safely in the future.   - Plan to discharge without anticoagulation with his recurrent bleeding as above      Sinus tachycardia   - Chronic. Continue to monitor       He reports that since discharge 4 weeks ago, his bleeding is much improved.  He is still changing his dressings every 8-12 hours, but there is very little blood at present.    He continues to hold his lovenox.  He is back on pazopanib 600 mg PO daily.    He reports no issues with his pazopanib other than his usual diarrhea - small amounts, 3-4 times/day.  He does not take imodium.     He reports no ETOH intake since late April(!)     No fever, chills, headache, visual disturbances.  No change in bowel  or bladder habits.  No chest pain, cough.  No history of bleeding.  No new cutaneous lesions. ROS is otherwise unremarkable.    NGS TESTING              Past Medical History:   Diagnosis Date    Gout     Hypertension     Stroke 08/2022    lethargy, transient L sided weakness. MR very small R parietla infarct, several punctate DWI lesions bilateal frontal, l parietal, L occpital. CTA head, neck no significant stenosis. TTE filling defect LA. CT chest tumor thrombus R ower, L upper pulmonary artery with extension into LA    Synovial sarcoma      Past Surgical History:   Procedure Laterality Date    REMOVAL CENTRAL VENOUS ACCESS DEVICE TUNNELED W/ PORT PUMP Left 02/09/2023    Laterality: Left;  Surgeon: Josetta GORMAN Copes, MD;  Location: [facility] ([facility])    INSERTION CVC TUNNELED W/ PORT PUMP Left 01/27/2023    Laterality: Left;  Surgeon: Josetta GORMAN Copes, MD;  Location: [facility] ([facility]IR)    REMOVAL CENTRAL VENOUS ACCESS DEVICE TUNNELED W/ PORT PUMP Right 12/18/2022    Laterality: Right;  Surgeon: Hildegard DELENA Brilliant, MD;  Location: [facility] ([facility])    INSERTION CVC TUNNELED W/ PORT PUMP N/A 09/19/2022    Laterality: N/A;  Surgeon: Lynwood CHRISTELLA Shasta PONCE, MD;  Location: [facility] ([facility]IR)    INSERTION CVC NON-TUNNELED N/A 08/29/2022    Laterality: N/A;  Surgeon: Lynwood CHRISTELLA Shasta PONCE, MD;  Location: [facility] ([facility]IR)    INSERTION VENA CAVA FILTER ENDOVASCULAR APPROACH W/ S&I N/A 08/26/2022    Laterality: N/A;  Surgeon: Rupert LITTIE Donalds, MD;  Location: [facility] ([facility]IR)     Social History     Socioeconomic History    Marital status: Single     Spouse name: Not on file    Number of children: Not on file    Years of education: Not on file    Highest education level: Not on file   Occupational History    Not on file   Tobacco Use    Smoking status: Never    Smokeless tobacco: Never   Vaping Use    Vaping  status: Never Used   Substance and Sexual Activity    Alcohol use: Not Currently    Drug use: Not Currently    Sexual activity: Not on file   Other Topics Concern    Not on file   Social History Narrative    Not on file     Social Drivers of Health     Financial Resource Strain: Low Risk  (05/08/2023)    Received [facility]    Overall Financial Resource Strain (CARDIA)     Difficulty of Paying Living Expenses: Not hard at all   Food Insecurity: No Food Insecurity (03/22/2024)    NCSS - Food Insecurity     Worried About Running Out of Food in the Last Year: No     Ran Out  of Food in the Last Year: No   Transportation Needs: No Transportation Needs (03/22/2024)    NCSS - Transportation     Lack of Transportation: No   Physical Activity: Not on file   Stress: Not on file   Social Connections: Not on file   Personal Safety: Not At Risk (03/22/2024)    NCSS - Interpersonal Safety     Feels Physically and Emotionally Safe: Yes     Physically Hurt by Someone: No     Humiliated or Emotionally Abused by Someone: No   Housing Stability: Not At Risk (03/22/2024)    NCSS - Housing/Utilities     Has Housing: Yes     Worried About Losing Housing: No     Unable to Get Utilities: No     History reviewed. No pertinent family history.   No family status information on file.     Current Medications:  Current Outpatient Medications   Medication Sig    Acetaminophen  325 MG tablet Take 2 tablets by mouth every 4 hours as needed.    Bismuth Tribromoph-Petrolatum (Xeroform Petrolatum Dres 4x4) 3 % Pads Apply 1 Pad topically daily.    Gabapentin 300 MG capsule Take 2 capsules by mouth 2 times daily.    Gauze Pads & Dressings (Abdominal Pad) 8X10 Pads 1 Pad by Unknown route daily.    Gauze Pads & Dressings (Kerlix Gauze Roll Medium) Misc 1 Units by Unknown route daily.    Levothyroxine  25 MCG tablet Take 1 tablet by mouth every morning before breakfast.    metroNIDAZOLE  500 MG tablet Apply 1  tablet topically every 8 hours. Crush and sprinkle over right leg wound as directed.    PAZOPanib HCl (Votrient) 200 MG tablet Take 3 tablets by mouth daily. Take on an empty stomach (1 hr before or 2 hrs after a meal)    aspirin 81 MG Chew Tab chewable tablet Chew 1 tablet daily. (Patient not taking: Reported on 04/27/2024)    ferrous sulfate 325 (65 Fe) MG tablet Take 1 tablet by mouth 3 times daily. (Patient not taking: Reported on 04/27/2024)    Folic acid  1 MG tablet Take 1 tablet by mouth daily. (Patient not taking: Reported on 04/27/2024)    guaiFENesin 100 MG/5ML Liquid Take 10 mL by mouth every 6 hours as needed for Cough. (Patient not taking: Reported on 04/27/2024)    oxyCODONE  5 MG tablet Take 1-2 tablets by mouth every 6 hours as needed for Moderate Pain or Severe Pain for up to 7 days. (Patient not taking: Reported on 04/27/2024)    Polyethylene glycol 17 g Pack packet Take 1 packet by mouth daily as needed. (Patient not taking: Reported on 04/27/2024)    Prochlorperazine 10 MG tablet Take 1 tablet by mouth every 6 hours as needed for Nausea / Vomiting. (Patient not taking: Reported on 04/27/2024)     Allergies:  Patient has no known allergies.    Review of Systems   Full ROS as noted in HPI.    Physical Examination  ECOG performance status:  2  BP 112/76 (BP Location: Left arm, BP Position: Sitting)   Pulse 88   Temp 98 F (36.7 C) (Temporal)   Resp 16   Wt 85.6 kg (188 lb 12.8 oz)   SpO2 99%   BMI 23.60 kg/m   Smoking Status Never    General Appearance: Alert, in no acute distress.  Pale.  HEENT : Head atraumatic, normocephalic. Pupils equal, round. No scleral icterus.  Mucous membranes moist, no lesions.   Neck: Supple,  no lymphadenopathy or thyromegaly.  Lungs: Normal respiratory effort and sounds  Cardiac:Normal S1, S2 . Rate and rhythm regular. No murmur, gallop or rub.   GI: Abdomen is soft, nontender, nondistended, positive bowel sounds.   Extremities: No cyanosis.  Massive  enlargement of RLE, 2+ edema of RLE See media tab for most updated photos of RLE fungating wounds.  Photo taken 04/27/2024.  Neuro: Speech is clear and appropriate. Affect is appropriate.   Musculoskeletal: No joint inflammation or swelling (other than right leg).  Psych: Pleasant affect.No sign of agitation .  Skin: see extremity exam above.  No rash,excessive bruising,petechiae. No hand foot symptoms.      Pathology:  07/22/2022    LABS:    Lab Results   Component Value Date    WBC 2.90 (L) 04/27/2024    HGB 11.5 (L) 04/27/2024    HCT 37.9 (L) 04/27/2024    PLATELET 186 04/27/2024    MCV 98.2 (H) 04/27/2024       Lab Results   Component Value Date    SODIUM 135 04/27/2024    POTASSIUM 3.1 (L) 04/27/2024    CHLORIDE 104 04/27/2024    CO2 28 04/27/2024    BUN 3 (L) 04/27/2024    CREATSERUM 0.57 (L) 04/27/2024    GLUCOSE 111 04/27/2024       Lab Results   Component Value Date    ALT 16 04/27/2024    AST 31 04/27/2024    ALKPHOS 140 (H) 04/27/2024    BILITOTAL 0.6 04/27/2024    BILIDIRECT 0.2 03/30/2024       Imaging:    Echocardiogram, 02/09/2023  Interpretation Summary  Left ventricular size is normal with mild, global hypokinesis and low normal to mildly reduced systolic function, ejection fraction +/-53%. Abnormal septal motion suggests elevated right heart pressure and volumes.  Left atrium measures mildly enlarged.   Right ventricle is enlarged with low normal to mildly reduced systolic function.  Estimated right ventricular/pulmonary artery systolic pressure may be underestimated, calculated at 30-71mmHg   Right atrium measures mildly enlarged in size  Valve structures are consistent with age. No valve dysfunction   Further study details described below     MRI leg 11/11/23  IMPRESSION:  1.  Slightly decreased size and enhancement of a large complex mass centered  within the posterior calf compatible with sarcoma. Area of ulceration with an  exophytic fungating component of the tumor appears similar to the  prior  examination.  2.  Stable to decreased size of small satellite lesions distal to the dominant  mass.  3.  Similar appearance of proximal extension along the popliteal neurovascular  bundle, likely tumor thrombus within the popliteal vein.     CT chest, 03/18/2024  IMPRESSION:   1.  No pulmonary embolism.  2.  Stable multiple large pulmonary nodules and masses compatible with  metastatic disease.    CT abdomen/pelvis, 03/18/2024  IMPRESSION:  No apparent abdominopelvic metastatic disease or adenopathy.  Extensive pulmonary metastatic disease.    CT tibia-fibula, right, 03/18/2024  IMPRESSION:  1.  Increased periostitis along the medial margin of the mid/distal tibia.  Otherwise, grossly unchanged appearance of the large soft tissue mass centered  in the posterior calf compartment with external fungating component compatible  with history of sarcoma.  2.  Increased diffuse nonspecific edema of the calf, possibly cellulitis in  the appropriate setting. No organized rim-enhancing fluid collection or soft  tissue  gas.       Assessment:   Kyle Howell is a 43 y.o. male who presents for follow-up for his synovial cell sarcoma of RLE.      Plan:    Synovial sarcoma, RLE  - Metastatic to lungs, pleura.  - Tempus xT NGS testing as noted above.    - Received 6 cycles of induction chemotherapy with HD-AIM.  - Switched to to IE after completion of 6th cycle of HD-AIM and with goal to treat to plateau.  - Given ICU hospitalization after C2, switched to pazopanib maintenance.  Plan  - Saw Dr. 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 If alcohol is controlled in future, will need further imaging of liver.  - Labs today reviewed.  Rx potassium chloride  20 mEq PO daily for K 3.0.  - Cleared to continue on pazopanib at the current dose.    2. Anemia  - Multifactorial (pazopanib, EtOH,  bleeding wound and question other source of bleeding).  - Improved with holding lovenox, reduced bleeding, RT to R calf fungating mass (03/28/2024).    3. PE / DVT 08/22/23  - Eliquis 5 mg BID daily HELD for recurrent PE (01/02/2024).  - changed to lovenox 90 mg subcutaneous q12h.  - HELD for bleeding from fungating leg wound (03/18/2024).    4. Hypothyroidism  - Due to pazopanib.  - Reports he just started taking levothyroxine  2 weeks ago.  - Continue on current dose 25 mcg daily.    5. Cancer related pain, neuropathy  - currently on gabapentin 600mg  BID.    6. Hx CVA  - Most likely embolic at time of diagnosis.    - Cleared to resume ASA 81 mg PO daily.     7. Wound, fungating tumor  - metronidazole  powder with each dressing change  - following with local wound care team.  Currently doing dressing changes with Xeroform dressing after being cleansed with saline.  ABDs applied with Kerlix and Ace wrap    8. Follow-up  - RTC 6 weeks with labs, restaging CT scans prior.    Thank you for the opportunity to participate in the care of Kyle Howell.      Alm DELENA Husband, MD    Orders Placed This Encounter    FERRITIN    IRON/IRON BINDING/TRANSFERRIN       "

## 2024-05-09 ENCOUNTER — Inpatient Hospital Stay: Admit: 2024-05-09 | Payer: Medicaid (Managed Care) | Primary: Family Medicine

## 2024-05-09 VITALS — BP 118/97 | HR 105 | Temp 96.90000°F | Resp 18

## 2024-05-09 DIAGNOSIS — D849 Immunodeficiency, unspecified: Secondary | ICD-10-CM

## 2024-05-09 DIAGNOSIS — C499 Malignant neoplasm of connective and soft tissue, unspecified: Principal | ICD-10-CM

## 2024-05-09 NOTE — Flowsheet Note (Signed)
 "    Wound Care Supplies      Supply Company:     [facility] [address] [place], [place] [address]  p: [phone] f: [phone]    Ordering Center:     [facility]  [address]  [place] [address]  [phone]  WOUND CARE [phone]  FAX NUMBER [phone]    Patient Information:      Langley Ingalls  [address]  [place] [address]   [phone]   DOB: 02-19-1981  AGE: 43 y.o.     GENDER: male   TODAYS DATE:  05/09/2024    Insurance:      PRIMARY INSURANCE:  Plan[place]  [place]age[place]ETHA [place]  Effective Date: 01/19/2023  [id number] - (Medicaid Managed)    SECONDARY INSURANCE:  Plan:   Coverage:   Effective Date:   @SECINSGROUPNUM @    @SECINSID @   @SECINSGROUPNUM @     Patient Wound Information:      Problem List Items Addressed This Visit          Musculoskeletal and Integument    Carcinoma of synovia (HCC) - Primary       WOUNDS REQUIRING DRESSING SUPPLIES:     Wound 04/17/23 Posterior;Right #2 leg (Active)   Wound Image   05/09/24 1434   Wound Etiology Other 05/09/24 1430   Wound Cleansed Cleansed with saline 05/09/24 1430   Wound Dressing/Treatment Xeroform;Dry dressing 05/09/24 1446   Wound Length (cm) 9.5 cm 05/09/24 1430   Wound Width (cm) 10 cm 05/09/24 1430   Wound Depth (cm) 1.7 cm 05/09/24 1430   Wound Surface Area (cm^2) 74.61 cm^2 05/09/24 1430   Wound Change in Wound Size % (l*w) -98.96 05/09/24 1430   Wound Volume (cm^3) 84.561 cm^3 05/09/24 1430   Wound Healing % -947 05/09/24 1430   Wound Assessment Eschar moist;Dusky 05/09/24 1430   Wound Drainage Amount Large (50-75% saturated) 05/09/24 1430   Wound Drainage Description Serosanguinous 05/09/24 1430   Wound Odor Malodorous/putrid 05/09/24 1430   Peri-wound Assessment Intact 05/09/24 1430   Wound Margins Defined edges 05/09/24 1430   Wound Thickness Description not for Pressure Injury Full thickness 05/09/24 1430   Incision Healing % -947 05/09/24 1430   Number of days: 388           Supplies Requested :      WOUND #: 2   PRIMARY DRESSING:  Other: Xeroform  SECONDARY DRESSING:  Other: Superabsorbent: Large ZETUVIT   Cover and Secure with: Other ABD pad Bulk Gauze      FREQUENCY OF DRESSING CHANGES:  Daily              ADDITIONAL ITEMS:  []  Gloves Small  [x]  Gloves Medium []  Gloves Large []  Gloves XLarge  []  Tape 1 [x]  Tape 2 []  Tape 3  []  Medipore Tape  [x]  Saline  []  Skin Prep   []  Adhesive Remover   []  Cotton Tip Applicators   []  Other:    Patient Wound(s) Debrided: [x]  Yes   []  No    Debribement Type: mechanical    Debridement Date: 05/09/24    Patient currently being seen by Home Health: []  Yes   [x]  No    Duration for needed supplies:  [] 15  [x] 30  [] 60  [] 90 Days    Provider Information:      PROVIDER'S NAME:  ISAIAH SHAPE, NP   WEP:[id number]        "
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
## 2024-05-09 NOTE — Discharge Instructions (Addendum)
"     [facility] Wound Center and Hyperbaric Medicine   Physician Orders and Discharge Instructions  [facility]  [address]  [place], [place]  [address]  Telephone: [phone]      FAX [phone]        NAME:  Kyle Howell                                                                                                 DATE OF BIRTH:  [birth year]  MEDICAL RECORD NUMBER:  [id number]     Your Case Manager is: Jamee or [facility] Care/Facility:  None     Wound Location: Right Posterior Leg, Right Medial Leg      Dressing orders:  1.Cleanse wound with Dakins solution. Sprinkle the wound bed with Metronidazole .   2. Cover with Xeroform to wounds then cover with optilock or super absorber   3. Wrap with bulky roll gauze  4. Change daily      Compression: none      Offloading Device:      Other Instructions: Continue with [facility] for treatments/ next appt in November 2025     Keep all dressings clean, dry and intact.  Keep pressure off the wound(s) at all times.      Follow up visit  4 weeks June 06, 2024 @  1:00     Please give 24 hour notice if unable to keep appointment. [phone]     If you experience any of the following, please call the Wound Care Service at  [phone] or go to the nearest emergency room.        *Increase in pain*Temperature over 101*Increase in drainage from your wound or a foul odor  *Uncontrolled swelling*Need for compression bandage changes due to slippage, breakthrough drainage       PLEASE NOTE: IF YOU ARE UNABLE TO OBTAIN WOUND SUPPLIES, CONTINUE TO USE THE SUPPLIES YOU HAVE AVAILABLE UNTIL YOU ARE ABLE TO REACH US . IT IS MOST IMPORTANT TO KEEP THE WOUND COVERED AT ALL TIMES     Electronically signed by Isaiah Shape, APRN - CNP on 05/09/2024 at 2:47 PM    "

## 2024-06-06 ENCOUNTER — Encounter: Payer: Medicaid (Managed Care) | Primary: Family Medicine

## 2024-06-10 ENCOUNTER — Inpatient Hospital Stay: Admit: 2024-06-10 | Payer: Medicaid (Managed Care) | Primary: Family Medicine

## 2024-06-10 VITALS — BP 128/96 | HR 112 | Temp 97.70000°F | Resp 18

## 2024-06-10 DIAGNOSIS — C7800 Secondary malignant neoplasm of unspecified lung: Secondary | ICD-10-CM

## 2024-06-10 DIAGNOSIS — C499 Malignant neoplasm of connective and soft tissue, unspecified: Principal | ICD-10-CM

## 2024-06-10 NOTE — Discharge Instructions (Addendum)
"  [facility] Wound Center and Hyperbaric Medicine   Physician Orders and Discharge Instructions  [facility]  [address]  [place], [place]  [address]  Telephone: [phone]      FAX [phone]        NAME:  Kyle Howell                                                                                                 DATE OF BIRTH:  Jul 02, 1981  MEDICAL RECORD NUMBER:  [id number]     Your Case Manager is: Jamee or [place]     Home Care/Facility:  None     Wound Location: Right Posterior Leg     Dressing orders:  1.Cleanse wound with Dakins solution. Sprinkle the wound bed with Metronidazole .   2. Cover with Xeroform to wounds then cover with optilock or super absorber   3. Wrap with bulky roll gauze  4. Change daily      Compression: none      Offloading Device:      Other Instructions: Continue with [facility] for treatments     Keep all dressings clean, dry and intact.  Keep pressure off the wound(s) at all times.      Follow up visit  4 weeks July 08, 2024 @  1045am     Please give 24 hour notice if unable to keep appointment. [phone]     If you experience any of the following, please call the Wound Care Service at  [phone] or go to the nearest emergency room.        *Increase in pain*Temperature over 101*Increase in drainage from your wound or a foul odor  *Uncontrolled swelling*Need for compression bandage changes due to slippage, breakthrough drainage       PLEASE NOTE: IF YOU ARE UNABLE TO OBTAIN WOUND SUPPLIES, CONTINUE TO USE THE SUPPLIES YOU HAVE AVAILABLE UNTIL YOU ARE ABLE TO REACH US . IT IS MOST IMPORTANT TO KEEP THE WOUND COVERED AT ALL TIMES    Electronically signed by Isaiah Shape, APRN - CNP on 06/10/2024 at 11:07 AM    "
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
## 2024-06-10 NOTE — Flowsheet Note (Signed)
 "    Wound Care Supplies      Supply Company:     [facility] [address] [place], [place] [address]  p: [phone] f: [phone]    Ordering Center:     [facility]  [address]  [place] [address]  [phone]  WOUND CARE [phone]  FAX NUMBER [phone]    Patient Information:      Kyle Howell  [address]  [place] [address]   [phone]   DOB: 08-24-1980  AGE: 43 y.o.     GENDER: male   TODAYS DATE:  06/14/2024    Insurance:      PRIMARY INSURANCE:  Plan: [place]  Coverage: [place]  Effective Date: 01/19/2023  [id number] - (Medicaid Managed)    SECONDARY INSURANCE:  Plan:   Coverage:   Effective Date:   @SECINSGROUPNUM @    @SECINSID @   @SECINSGROUPNUM @     Patient Wound Information:      Problem List Items Addressed This Visit          Musculoskeletal and Integument    Carcinoma of synovia (HCC) - Primary       WOUNDS REQUIRING DRESSING SUPPLIES:     Wound 04/17/23 Posterior;Right #2 leg (Active)   Wound Image   06/10/24 1053   Wound Type Wound 06/10/24 1053   Wound Etiology Other 05/09/24 1430   Wound Cleansed Cleansed with saline 06/10/24 1053   Wound Dressing/Treatment Xeroform 06/10/24 1111   Wound Length (cm) 9.5 cm 06/10/24 1053   Wound Width (cm) 13 cm 06/10/24 1053   Wound Depth (cm) 1.7 cm 06/10/24 1053   Wound Surface Area (cm^2) 97 cm^2 06/10/24 1053   Wound Change in Wound Size % (l*w) -158.67 06/10/24 1053   Wound Volume (cm^3) 109.929 cm^3 06/10/24 1053   Wound Healing % -1261 06/10/24 1053   Wound Assessment Eschar moist;Purple/maroon;Slough 06/10/24 1053   Wound Drainage Amount Large (50-75% saturated) 06/10/24 1053   Wound Drainage Description Serosanguinous 06/10/24 1053   Wound Odor Malodorous/putrid 06/10/24 1053   Peri-wound Assessment Intact 06/10/24 1053   Wound Margins Defined edges 06/10/24 1053   Wound Thickness Description not for Pressure Injury Full thickness 06/10/24 1053   Incision Healing % -1261 06/10/24  1053   Number of days: 424          Supplies Requested :          WOUND #: 2   PRIMARY DRESSING:  Other: Xeroform  SECONDARY DRESSING:  Other: Superabsorbent: Large ZETUVIT   Cover and Secure with: Other ABD pad Bulk Gauze      FREQUENCY OF DRESSING CHANGES:  Daily           ADDITIONAL ITEMS:  []  Gloves Small  [x]  Gloves Medium []  Gloves Large []  Gloves XLarge  []  Tape 1 [x]  Tape 2 []  Tape 3  []  Medipore Tape  [x]  Saline  []  Skin Prep   []  Adhesive Remover   []  Cotton Tip Applicators   []  Other:    Patient Wound(s) Debrided: [x]  Yes   []  No    Debribement Type: subcutaneous tissue    Debridement Date: 06/10/24    Patient currently being seen by Home Health: []  Yes   [x]  No    Duration for needed supplies:  [] 15  [x] 30  [] 60  [] 90 Days    Provider Information:      PROVIDER'S NAME:  ISAIAH SHAPE, NP  WEP:[id number]        "

## 2024-06-10 NOTE — Plan of Care (Signed)
"    Problem: Wound:  Goal: Will show signs of wound healing; wound closure and no evidence of infection  Description: Will show signs of wound healing; wound closure and no evidence of infection  Outcome: Progressing     "

## 2024-07-08 ENCOUNTER — Encounter: Payer: Medicaid (Managed Care) | Primary: Family Medicine

## 2024-07-18 ENCOUNTER — Inpatient Hospital Stay: Admit: 2024-07-18 | Payer: Medicaid (Managed Care) | Primary: Family Medicine

## 2024-07-18 VITALS — BP 130/88 | Temp 97.10000°F | Resp 18

## 2024-07-18 DIAGNOSIS — C499 Malignant neoplasm of connective and soft tissue, unspecified: Principal | ICD-10-CM

## 2024-07-18 DIAGNOSIS — S81801A Unspecified open wound, right lower leg, initial encounter: Secondary | ICD-10-CM

## 2024-07-18 NOTE — Discharge Instructions (Addendum)
"     [facility] Wound Center and Hyperbaric Medicine   Physician Orders and Discharge Instructions  [facility]  [address]  [place], [place]  [address]  Telephone: [phone]      FAX [phone]        NAME:  Kyle Howell                                                                                                 DATE OF BIRTH:  12/05/1980  MEDICAL RECORD NUMBER:  [id number]     Your Case Manager is: Jamee or [place]     Home Care/Facility:  None     Wound Location: Right Posterior Leg     Dressing orders:  1.Cleanse wound with Dakins solution. Sprinkle the wound bed with Metronidazole .   2. Cover with Xeroform to wounds then cover with optilock or Super Absorbent   3. Wrap with bulky roll gauze  4. Change daily      Compression: none      Offloading Device:      Other Instructions: Continue with [facility] for treatments     Keep all dressings clean, dry and intact.  Keep pressure off the wound(s) at all times.      Follow up visit: August 15, 2024 @ 1:00     Please give 24 hour notice if unable to keep appointment. [phone]     If you experience any of the following, please call the Wound Care Service at  [phone] or go to the nearest emergency room.        *Increase in pain*Temperature over 101*Increase in drainage from your wound or a foul odor  *Uncontrolled swelling*Need for compression bandage changes due to slippage, breakthrough drainage       PLEASE NOTE: IF YOU ARE UNABLE TO OBTAIN WOUND SUPPLIES, CONTINUE TO USE THE SUPPLIES YOU HAVE AVAILABLE UNTIL YOU ARE ABLE TO REACH US . IT IS MOST IMPORTANT TO KEEP THE WOUND COVERED AT ALL TIMES     Electronically signed by Isaiah Shape, APRN - CNP on 07/18/2024 at 2:44 PM    "
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
## 2024-07-18 NOTE — Flowsheet Note (Signed)
 "    Wound Care Supplies      Supply Company:     [facility] [address] [place], [place] [address]  p: [phone] f: [phone]    Ordering Center:     [facility] C[facility]  [address]  [place] [address]  [phone]  WOUND CARE [phone]  FAX NUMBER [phone]    Patient Information:      Humphrey Guerreiro  [address]  [place] [address]   [phone]   DOB: 06-May-1981  AGE: 43 y.o.     GENDER: male   TODAYS DATE:  07/18/2024    Insurance:      PRIMARY INSURANCE:  Plan[place]  [place]  Effective Date: 01/19/2023  [id number] - (Medicaid Managed)    SECONDARY INSURANCE:  Plan:   Coverage:   Effective Date:   @SECINSGROUPNUM @    @SECINSID @   @SECINSGROUPNUM @     Patient Wound Information:      Problem List Items Addressed This Visit          Musculoskeletal and Integument    Carcinoma of synovia (HCC) - Primary       Other    Open wound of right lower leg       WOUNDS REQUIRING DRESSING SUPPLIES:     Wound 04/17/23 Posterior;Right #2 leg (Active)   Wound Image   07/18/24 1431   Wound Type Wound 06/10/24 1053   Wound Etiology Other 07/18/24 1426   Wound Cleansed Cleansed with saline 07/18/24 1426   Wound Dressing/Treatment Xeroform 06/10/24 1111   Wound Length (cm) 9 cm 07/18/24 1432   Wound Width (cm) 10 cm 07/18/24 1432   Wound Depth (cm) 1.5 cm 07/18/24 1432   Wound Surface Area (cm^2) 70.69 cm^2 07/18/24 1432   Wound Change in Wound Size % (l*w) -88.51 07/18/24 1432   Wound Volume (cm^3) 70.686 cm^3 07/18/24 1432   Wound Healing % -775 07/18/24 1432   Wound Assessment Pink/red;Slough 07/18/24 1434   Wound Drainage Amount Large (50-75% saturated) 07/18/24 1434   Wound Drainage Description Serosanguinous 07/18/24 1434   Wound Odor Malodorous/putrid 07/18/24 1434   Peri-wound Assessment Intact 07/18/24 1434   Wound Margins Defined edges 07/18/24 1434   Wound Thickness Description not for Pressure Injury Full thickness 07/18/24 1434   Incision  Healing % -775 07/18/24 1432   Number of days: 458          Supplies Requested :        WOUND #: 2   PRIMARY DRESSING:  Other: Xeroform  SECONDARY DRESSING:  Other: Superabsorbent: Large ZETUVIT   Cover and Secure with: Other ABD pad and Bulk Gauze      FREQUENCY OF DRESSING CHANGES:  Daily              ADDITIONAL ITEMS:  []  Gloves Small  [x]  Gloves Medium []  Gloves Large []  Gloves XLarge  []  Tape 1 [x]  Tape 2 []  Tape 3  []  Medipore Tape  [x]  Saline  []  Skin Prep   []  Adhesive Remover   []  Cotton Tip Applicators   []  Other:    Patient Wound(s) Debrided: [x]  Yes   []  No    Debribement Type: mechanical    Debridement Date: 07/18/24    Patient currently being seen by Home Health: []  Yes   [x]  No    Duration for needed supplies:  [] 15  [x] 30  [] 60  [] 90 Days  Provider Information:      PROVIDER'S NAME:  ISAIAH SHAPE, NP   WEP:[id number]        "

## 2024-07-18 NOTE — Plan of Care (Signed)
"    Problem: Wound:  Goal: Will show signs of wound healing; wound closure and no evidence of infection  Description: Will show signs of wound healing; wound closure and no evidence of infection  Outcome: Progressing     "

## 2024-07-22 NOTE — Telephone Encounter (Signed)
 Chart and OARRs reviewed and refilled oxycodone 

## 2024-08-15 ENCOUNTER — Encounter: Payer: Medicaid (Managed Care) | Primary: Family Medicine

## 2024-08-19 ENCOUNTER — Inpatient Hospital Stay: Admit: 2024-08-19 | Payer: Medicaid (Managed Care) | Primary: Family Medicine

## 2024-08-19 DIAGNOSIS — L97512 Non-pressure chronic ulcer of other part of right foot with fat layer exposed: Secondary | ICD-10-CM

## 2024-08-19 DIAGNOSIS — C499 Malignant neoplasm of connective and soft tissue, unspecified: Principal | ICD-10-CM

## 2024-08-19 NOTE — Discharge Instructions (Addendum)
 [facility] Wound Center and Hyperbaric Medicine   Physician Orders and Discharge Instructions  [facility]  [address]  [place], [place]  [address]  Telephone: [phone]      FAX [phone]        NAME:  Kyle Howell                                                                                                 DATE OF BIRTH:  [birth year]  MEDICAL RECORD NUMBER:  [id number]     Your Case Manager is: Jamee or [place]     Home Care/Facility:  None     Wound Location: Right Posterior Leg     Dressing orders:  1.Cleanse wound with Dakins solution. Sprinkle the wound bed with Metronidazole , then apply the hydrogel   2. Cover with Xeroform to wounds then cover with optilock or Super Absorbent   3. Wrap with bulky roll gauze  4. Change daily      Compression: none      Offloading Device:      Other Instructions: Continue with [facility] for treatments     Keep all dressings clean, dry and intact.  Keep pressure off the wound(s) at all times.      Follow up visit: 4 weeks Feb 27 , 2026 @ 1130am     Please give 24 hour notice if unable to keep appointment. [phone]     If you experience any of the following, please call the Wound Care Service at  [phone] or go to the nearest emergency room.        *Increase in pain*Temperature over 101*Increase in drainage from your wound or a foul odor  *Uncontrolled swelling*Need for compression bandage changes due to slippage, breakthrough drainage       PLEASE NOTE: IF YOU ARE UNABLE TO OBTAIN WOUND SUPPLIES, CONTINUE TO USE THE SUPPLIES YOU HAVE AVAILABLE UNTIL YOU ARE ABLE TO REACH US . IT IS MOST IMPORTANT TO KEEP THE WOUND COVERED AT ALL TIMES    Electronically signed by Isaiah Shape, APRN - CNP on 08/19/2024 at 3:06 PM
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
## 2024-08-19 NOTE — Progress Notes (Signed)
 Wound Care Supplies      Supply Company:     [facility] [address] [place], [place] [address]  p: [phone] f: [phone]     Ordering Center:     [facility]ARE  [address]  [place] [address]  [phone]  WOUND CARE Dept: [id number]   [facility] NUMBER [id number]    Patient Information:      Kyle Howell  [address]  [place] [address]   [phone]   DOB: Mar 08, 1981  AGE: 44 y.o.     GENDER: male   EPISODE DATE: 08/19/2024    Insurance:      PRIMARY INSURANCE:  [place]  [place]ageBETHA [place]  Effective Date: 01/19/2023  Group Number: @[id number]  Subscriber Number: [id number] - (Medicaid Managed)    Payer/Plan Subscr DOB Sex Relation Sub. Ins. ID Effective Group Num   1. HUMANA MEDICAGILDARDO, TICKNER Feb 09, 1981 Male Self [id number] 01/19/23 [id number]                                   [address] [address]       Patient Wound Information:      Problem List Items Addressed This Visit          Musculoskeletal and Integument    Carcinoma of synovia (HCC) - Primary       ICD 10 Code:     S81.801A       WOUNDS REQUIRING DRESSING SUPPLIES:     Wound 04/17/23 Posterior;Right #2 leg (Active)   Wound Image   08/19/24 1451   Wound Etiology Other 08/19/24 1449   Wound Cleansed Cleansed with saline 08/19/24 1449   Wound Dressing/Treatment Hydrating gel;Xeroform;Roll gauze 08/19/24 1507   Wound Length (cm) 9 cm 08/19/24 1449   Wound Width (cm) 10 cm 08/19/24 1449   Wound Depth (cm) 1 cm 08/19/24 1449   Wound Surface Area (cm^2) 70.69 cm^2 08/19/24 1449   Wound Change in Wound Size % (l*w) -88.51 08/19/24 1449   Wound Volume (cm^3) 47.124 cm^3 08/19/24 1449   Wound Healing % -484 08/19/24 1449   Wound Assessment Eschar moist;Pink/red;Purple/maroon;Slough 08/19/24 1449   Wound Drainage Amount Large (50-75% saturated) 08/19/24 1449   Wound Drainage Description Serosanguinous 08/19/24 1449   Wound Odor Malodorous/putrid 08/19/24 1449   Peri-wound Assessment  Edematous;Intact 08/19/24 1449   Wound Margins Defined edges 08/19/24 1449   Wound Thickness Description not for Pressure Injury Full thickness 08/19/24 1449   Incision Healing % -484 08/19/24 1449   Number of days: 490          Treatment plan:   For wound(s): 1  Apply primary dressing hydrogel then xeroform  Cover with superabsorber  Frequency of change daily      Supplies Requested :      WOUND #: 1   PRIMARY DRESSING:  Hydrogel tube 3 oz size , Other: xeroform 4x4s   secondary dressing: Other superabsorber     FREQUENCY OF DRESSING CHANGES:  Daily           ADDITIONAL ITEMS:  []  Gloves Small  []  Gloves Medium [x]  Gloves Large []  Gloves XLarge  []  Tape 1 [x]  Tape 2 []  Tape 3  []  Medipore Tape  [x]  Saline  []  Vashe  []  Skin Prep   []  Adhesive Remover   []  Cotton Tip Applicators   []  Other:  Patient Wound(s) Debrided: [x]  Yes if yes please add date 08/19/24   []  No    Debribement Type: Mechanical  All wounds debrided     Patient currently being seen by Home Health: []  Yes   [x]  No    Duration for needed supplies:  [] 15  [x] 30  [] 60  [] 90 Days    Electronically signed by Rosaline Baas, RN on 08/19/2024 at 3:11 PM     Provider Information:      PROVIDER'S NAME: ISAIAH SHAPE, NP    NPI:     [id number]

## 2024-09-16 ENCOUNTER — Encounter: Payer: Medicaid (Managed Care) | Primary: Family Medicine
